# Patient Record
Sex: Female | Born: 1940 | Race: White | Hispanic: No | Smoking: Never smoker
Health system: Southern US, Community
[De-identification: ages and names within clinical notes are randomized; demographics above are authoritative.]

## PROBLEM LIST (undated history)

## (undated) DIAGNOSIS — M8580 Other specified disorders of bone density and structure, unspecified site: Secondary | ICD-10-CM

## (undated) DIAGNOSIS — I10 Essential (primary) hypertension: Secondary | ICD-10-CM

## (undated) DIAGNOSIS — I219 Acute myocardial infarction, unspecified: Secondary | ICD-10-CM

## (undated) DIAGNOSIS — E785 Hyperlipidemia, unspecified: Secondary | ICD-10-CM

## (undated) DIAGNOSIS — I259 Chronic ischemic heart disease, unspecified: Secondary | ICD-10-CM

## (undated) HISTORY — PX: BREAST SURGERY: SHX581

## (undated) HISTORY — DX: Hyperlipidemia, unspecified: E78.5

## (undated) HISTORY — DX: Essential (primary) hypertension: I10

## (undated) HISTORY — DX: Chronic ischemic heart disease, unspecified: I25.9

## (undated) HISTORY — DX: Acute myocardial infarction, unspecified: I21.9

## (undated) HISTORY — DX: Other specified disorders of bone density and structure, unspecified site: M85.80

---

## 1982-04-28 HISTORY — PX: ABDOMINAL HYSTERECTOMY: SHX81

## 1998-03-26 ENCOUNTER — Ambulatory Visit (HOSPITAL_COMMUNITY): Admission: RE | Admit: 1998-03-26 | Discharge: 1998-03-26 | Payer: Self-pay | Admitting: Gastroenterology

## 1998-03-26 ENCOUNTER — Encounter: Payer: Self-pay | Admitting: Gastroenterology

## 1999-11-29 ENCOUNTER — Other Ambulatory Visit: Admission: RE | Admit: 1999-11-29 | Discharge: 1999-11-29 | Payer: Self-pay | Admitting: *Deleted

## 2000-01-22 ENCOUNTER — Encounter: Admission: RE | Admit: 2000-01-22 | Discharge: 2000-01-22 | Payer: Self-pay | Admitting: *Deleted

## 2000-01-22 ENCOUNTER — Encounter: Payer: Self-pay | Admitting: *Deleted

## 2000-12-10 ENCOUNTER — Ambulatory Visit (HOSPITAL_COMMUNITY): Admission: RE | Admit: 2000-12-10 | Discharge: 2000-12-10 | Payer: Self-pay | Admitting: Internal Medicine

## 2001-04-28 DIAGNOSIS — I219 Acute myocardial infarction, unspecified: Secondary | ICD-10-CM

## 2001-04-28 HISTORY — DX: Acute myocardial infarction, unspecified: I21.9

## 2001-09-29 ENCOUNTER — Other Ambulatory Visit: Admission: RE | Admit: 2001-09-29 | Discharge: 2001-09-29 | Payer: Self-pay | Admitting: *Deleted

## 2001-12-29 ENCOUNTER — Encounter: Payer: Self-pay | Admitting: Internal Medicine

## 2001-12-29 ENCOUNTER — Encounter: Admission: RE | Admit: 2001-12-29 | Discharge: 2001-12-29 | Payer: Self-pay | Admitting: Internal Medicine

## 2002-01-03 ENCOUNTER — Encounter: Admission: RE | Admit: 2002-01-03 | Discharge: 2002-01-03 | Payer: Self-pay | Admitting: Internal Medicine

## 2002-01-03 ENCOUNTER — Encounter: Payer: Self-pay | Admitting: Internal Medicine

## 2002-01-10 ENCOUNTER — Ambulatory Visit (HOSPITAL_COMMUNITY): Admission: RE | Admit: 2002-01-10 | Discharge: 2002-01-10 | Payer: Self-pay | Admitting: Internal Medicine

## 2002-01-10 ENCOUNTER — Encounter: Payer: Self-pay | Admitting: Internal Medicine

## 2002-01-13 ENCOUNTER — Inpatient Hospital Stay (HOSPITAL_COMMUNITY): Admission: EM | Admit: 2002-01-13 | Discharge: 2002-01-17 | Payer: Self-pay | Admitting: Emergency Medicine

## 2002-01-13 ENCOUNTER — Encounter: Payer: Self-pay | Admitting: Emergency Medicine

## 2002-02-21 ENCOUNTER — Encounter (HOSPITAL_COMMUNITY): Admission: RE | Admit: 2002-02-21 | Discharge: 2002-05-22 | Payer: Self-pay | Admitting: Cardiology

## 2002-09-30 ENCOUNTER — Other Ambulatory Visit: Admission: RE | Admit: 2002-09-30 | Discharge: 2002-09-30 | Payer: Self-pay | Admitting: Obstetrics and Gynecology

## 2003-10-31 ENCOUNTER — Other Ambulatory Visit: Admission: RE | Admit: 2003-10-31 | Discharge: 2003-10-31 | Payer: Self-pay | Admitting: Obstetrics and Gynecology

## 2004-11-14 ENCOUNTER — Other Ambulatory Visit: Admission: RE | Admit: 2004-11-14 | Discharge: 2004-11-14 | Payer: Self-pay | Admitting: Obstetrics and Gynecology

## 2005-02-26 ENCOUNTER — Encounter: Admission: RE | Admit: 2005-02-26 | Discharge: 2005-02-26 | Payer: Self-pay | Admitting: Internal Medicine

## 2005-08-01 ENCOUNTER — Inpatient Hospital Stay (HOSPITAL_COMMUNITY): Admission: EM | Admit: 2005-08-01 | Discharge: 2005-08-02 | Payer: Self-pay | Admitting: Emergency Medicine

## 2005-12-01 ENCOUNTER — Other Ambulatory Visit: Admission: RE | Admit: 2005-12-01 | Discharge: 2005-12-01 | Payer: Self-pay | Admitting: Obstetrics and Gynecology

## 2006-05-07 ENCOUNTER — Encounter: Admission: RE | Admit: 2006-05-07 | Discharge: 2006-05-07 | Payer: Self-pay | Admitting: Internal Medicine

## 2006-12-22 ENCOUNTER — Encounter: Admission: RE | Admit: 2006-12-22 | Discharge: 2006-12-22 | Payer: Self-pay | Admitting: Internal Medicine

## 2006-12-25 ENCOUNTER — Other Ambulatory Visit: Admission: RE | Admit: 2006-12-25 | Discharge: 2006-12-25 | Payer: Self-pay | Admitting: Obstetrics and Gynecology

## 2007-01-12 ENCOUNTER — Encounter: Admission: RE | Admit: 2007-01-12 | Discharge: 2007-01-12 | Payer: Self-pay | Admitting: Internal Medicine

## 2007-09-13 ENCOUNTER — Encounter: Admission: RE | Admit: 2007-09-13 | Discharge: 2007-09-13 | Payer: Self-pay | Admitting: Internal Medicine

## 2007-10-13 ENCOUNTER — Encounter: Admission: RE | Admit: 2007-10-13 | Discharge: 2007-10-13 | Payer: Self-pay | Admitting: Internal Medicine

## 2008-01-25 ENCOUNTER — Ambulatory Visit: Payer: Self-pay | Admitting: Obstetrics and Gynecology

## 2009-02-09 ENCOUNTER — Ambulatory Visit: Payer: Self-pay | Admitting: Obstetrics and Gynecology

## 2009-02-09 ENCOUNTER — Encounter: Payer: Self-pay | Admitting: Obstetrics and Gynecology

## 2009-02-09 ENCOUNTER — Other Ambulatory Visit: Admission: RE | Admit: 2009-02-09 | Discharge: 2009-02-09 | Payer: Self-pay | Admitting: Obstetrics and Gynecology

## 2009-02-19 ENCOUNTER — Ambulatory Visit: Payer: Self-pay | Admitting: Obstetrics and Gynecology

## 2009-08-29 ENCOUNTER — Ambulatory Visit: Payer: Self-pay | Admitting: Gynecology

## 2010-02-01 ENCOUNTER — Ambulatory Visit: Payer: Self-pay | Admitting: Cardiology

## 2010-02-04 ENCOUNTER — Ambulatory Visit: Payer: Self-pay | Admitting: Cardiology

## 2010-02-20 ENCOUNTER — Ambulatory Visit: Payer: Self-pay | Admitting: Obstetrics and Gynecology

## 2010-02-20 ENCOUNTER — Other Ambulatory Visit: Admission: RE | Admit: 2010-02-20 | Discharge: 2010-02-20 | Payer: Self-pay | Admitting: Obstetrics and Gynecology

## 2010-06-19 ENCOUNTER — Ambulatory Visit: Payer: Self-pay | Admitting: Cardiology

## 2010-07-08 ENCOUNTER — Ambulatory Visit: Payer: Self-pay | Admitting: Cardiology

## 2010-08-07 ENCOUNTER — Other Ambulatory Visit: Payer: Self-pay | Admitting: *Deleted

## 2010-08-07 ENCOUNTER — Encounter: Payer: Self-pay | Admitting: *Deleted

## 2010-08-07 DIAGNOSIS — E7800 Pure hypercholesterolemia, unspecified: Secondary | ICD-10-CM

## 2010-08-08 ENCOUNTER — Encounter: Payer: Self-pay | Admitting: Cardiology

## 2010-08-08 ENCOUNTER — Ambulatory Visit (INDEPENDENT_AMBULATORY_CARE_PROVIDER_SITE_OTHER): Payer: Medicare Other | Admitting: Cardiology

## 2010-08-08 ENCOUNTER — Other Ambulatory Visit (INDEPENDENT_AMBULATORY_CARE_PROVIDER_SITE_OTHER): Payer: Medicare Other | Admitting: *Deleted

## 2010-08-08 DIAGNOSIS — I209 Angina pectoris, unspecified: Secondary | ICD-10-CM

## 2010-08-08 DIAGNOSIS — E7800 Pure hypercholesterolemia, unspecified: Secondary | ICD-10-CM

## 2010-08-08 DIAGNOSIS — I252 Old myocardial infarction: Secondary | ICD-10-CM

## 2010-08-08 DIAGNOSIS — M810 Age-related osteoporosis without current pathological fracture: Secondary | ICD-10-CM | POA: Insufficient documentation

## 2010-08-08 LAB — BASIC METABOLIC PANEL
BUN: 23 mg/dL (ref 6–23)
Calcium: 9.5 mg/dL (ref 8.4–10.5)
Chloride: 102 mEq/L (ref 96–112)
Creatinine, Ser: 1 mg/dL (ref 0.4–1.2)
Sodium: 138 mEq/L (ref 135–145)

## 2010-08-08 LAB — LIPID PANEL
HDL: 68.8 mg/dL (ref >39.00)
VLDL: 7 mg/dL (ref 0.0–40.0)

## 2010-08-08 LAB — HEPATIC FUNCTION PANEL
AST: 30 U/L (ref 0–37)
Alkaline Phosphatase: 30 U/L — ABNORMAL LOW (ref 39–117)
Total Bilirubin: 0.9 mg/dL (ref 0.3–1.2)
Total Protein: 6.4 g/dL (ref 6.0–8.3)

## 2010-08-08 MED ORDER — NITROGLYCERIN 0.4 MG SL SUBL: 0.4000 mg | SUBLINGUAL_TABLET | SUBLINGUAL | Status: DC | PRN

## 2010-08-08 NOTE — Assessment & Plan Note (Signed)
The patient has been feeling well.  She's not having any chest pain or shortness of breath.  No palpitations.  She walks for exercise.  She's not having any dizziness or syncope.  She has had some recent problems with the veins in her right leg and is being [facility]

## 2010-08-08 NOTE — Assessment & Plan Note (Signed)
The patient has been watching her diet although her weight is up slightly since last visit.  She is on low dose Crestor 5 mg daily and his also taking fish oil thousand milligrams one daily.  She is not having any adverse effects from the statin therapy

## 2010-08-08 NOTE — Progress Notes (Signed)
HPI: This pleasant 70 year old woman is seen for a scheduled 4 month followup office visit.  She had a non-Q wave myocardial infarction in 2003.  Cardiac catheterization at that time showed single vessel obstructive disease of the obtuse marginal branch there was a small vessel and was not suitable for catheter-based intervention.  She has remained on aggressive medical therapy including aspirin and Plavix beta blocker and statin.  Her last nuclear stress test was 04/13/07 and showed no she has a soft systolic murmur and an echocardiogram in August 2010 showed no significant valvular heart disease except for slight mitral valve prolapse.  Current Outpatient Prescriptions  Medication Sig Dispense Refill  . alendronate (FOSAMAX) 70 MG tablet Take 70 mg by mouth every 7 (seven) days. Take with a full glass of water on an empty stomach.       Marland Kitchen aspirin 81 MG tablet Take 81 mg by mouth daily.        . Calcium-Vitamin D (POSTURE-D PO) Take 600 mg by mouth 2 (two) times daily.        . Cholecalciferol (VITAMIN D) 1000 UNITS capsule Take 2,000 Units by mouth daily.        . clopidogrel (PLAVIX) 75 MG tablet Take 75 mg by mouth daily.        . Coenzyme Q10 (COQ10) 100 MG CAPS Take 100 mg by mouth daily.        Marland Kitchen HYDROcodone-acetaminophen (VICODIN) 5-500 MG per tablet Take 1 tablet by mouth every 6 (six) hours as needed.        . Methylcellulose, Laxative, (CITRUCEL PO) Take by mouth daily.        . metoprolol (LOPRESSOR) 50 MG tablet Take 50 mg by mouth 2 (two) times daily. 1/2 bid       . nitroGLYCERIN (NITROSTAT) 0.4 MG SL tablet Place 1 tablet (0.4 mg total) under the tongue every 5 (five) minutes as needed for chest pain.  90 tablet  3  . Omega-3 Fatty Acids (FISH OIL) 1000 MG CAPS Take by mouth daily.        . rosuvastatin (CRESTOR) 5 MG tablet Take 5 mg by mouth daily.        Marland Kitchen DISCONTD: nitroGLYCERIN (NITROSTAT) 0.4 MG SL tablet Place 0.4 mg under the tongue every 5 (five) minutes as needed.           Allergies  Allergen Reactions  . Codeine   . Lipitor (Atorvastatin Calcium)   . Simvastatin     Patient Active Problem List  Diagnoses  . Old MI (myocardial infarction)  . Hypercholesterolemia  . Osteoporosis    History  Smoking status  . Never Smoker   Smokeless tobacco  . Never Used    History  Alcohol Use No    No family history on file.  Review of Systems: The patient denies any heat or cold intolerance.  No weight gain or weight loss.  The patient denies headaches or blurry vision.  There is no cough or sputum production.  The patient denies dizziness.  There is no hematuria or hematochezia.  The patient denies any muscle aches or arthritis.  The patient denies any rash.  The patient denies frequent falling or instability.  There is no history of depression or anxiety.  All other systems were reviewed and are negative.   Physical Exam: Filed Vitals:   08/08/10 0940  BP: 120/80  Pulse: 82  The general appearance reveals a well-developed well-nourished woman in no distress.Pupils equal and  reactive.   Extraocular Movements are full.  There is no scleral icterus.  The mouth and pharynx are normal.  The neck is supple.  The carotids reveal no bruits.  The jugular venous pressure is normal.  The thyroid is not enlarged.  There is no lymphadenopathy.The chest is clear to percussion and auscultation. There are no rales or rhonchi. Expansion of the chest is symmetrical.  The heart reveals a soft systolic murmur at the base.  No gallop or rub.  No diastolic murmur.The abdomen is soft and nontender. Bowel sounds are normal. The liver and spleen are not enlarged. There Are no abdominal masses. There are no bruits.The pedal pulses are good.  There is no phlebitis or edema.  There is no cyanosis or clubbing.Strength is normal and symmetrical in all extremities.  There is no lateralizing weakness.  There are no sensory deficits.    Assessment / Plan: Continue present medication.   Blood work today pending.  Recheck in 4 months for followup office visit and fasting lab work.

## 2010-08-09 ENCOUNTER — Telehealth: Payer: Self-pay | Admitting: *Deleted

## 2010-08-09 NOTE — Telephone Encounter (Signed)
Adv.labs excellent

## 2010-09-13 NOTE — H&P (Signed)
[name], FEDORCHAK NO.:  [id number]   MEDICAL RECORD NO.:  [id number]          PATIENT TYPE:  EMS   LOCATION:  MAJO                         FACILITY:  [facility]   PHYSICIAN:  Vesta Mixer, M.D. DATE OF BIRTH:  01-14-1941   DATE OF ADMISSION:  08/01/2005  DATE OF DISCHARGE:                                HISTORY & PHYSICAL   Kayla [name] is a middle-aged female with a history of coronary artery disease.  She presents to the emergency department with several days of indigestion  and chest pain.   The patient has a long history of coronary artery disease.  She had a heart  catheterization in September 2003.  Her LAD and right coronary artery were  fairly smooth and normal.  Her left anterior descending artery gave off a  very tortuous first obtuse marginal artery.  She had a 90% stenosis in the  distal aspect of this first OM.  It was thought that this vessel was too  tortuous to attempt stenting, and she has done quite well from medical  therapy for the past 4 years.  In fact, she had a stress Cardiolite study in  September 2006 which was unremarkable.   For the past several days, she has had worsening episodes of indigestion and  some abdominal problems.  She now reports having some chest pain.  The chest  pain lasts for as long as 30 minutes.  It was described as a sharp-like  sensation.  There is no radiation.  She has noticed some numbness  occasionally of her right hand, but this does not seem to be associated with  her episodes of chest pain.  She denies any syncope or presyncope.  She  denies any shortness of breath.  She denies any PND or orthopnea.  Her diet  has been fairly normal for the past couple of days.  She has not taken any  nitroglycerin.   CURRENT MEDICATIONS:  1.  Plavix 75 mg daily.  2.  Metoprolol 25 mg p.o. twice daily.  3.  Aspirin 81 mg daily.  4.  Vytorin 10 mg/40 mg once daily.  5.  Fosamax 70 mg weekly.   ALLERGIES:  She is  intolerant to CODEINE.   PAST MEDICAL HISTORY:  1.  Coronary artery disease.  She has single-vessel coronary artery disease.  2.  Hyperlipidemia.   SOCIAL HISTORY:  The patient is a nonsmoker.   FAMILY HISTORY:  Noncontributory.   REVIEW OF SYSTEMS:  She denies any problems with her eyes, ears, nose, and  throat.  She denies any weight gain or weight loss.  She denies any GU  problems.  She has had some indigestion and abdominal pain over the past  several days.  She denies any easy bruisability.  She denies any problems  with her arms or legs.  She denies any heat or cold intolerance.  She also  denies any syncope or presyncope.  Her review of systems was reviewed and  was otherwise negative.   PHYSICAL EXAMINATION:  GENERAL:  She is an elderly female in no acute  distress.  VITAL SIGNS:  Blood pressure is 100/67 with a heart rate of 62.  Respirations are normal.  HEENT:  2+ carotids.  She has no JVD, no thyromegaly.  LUNGS:  Clear to auscultation.  HEART:  Regular rate, S1, S2.  Has no murmurs.  ABDOMEN:  Good bowel sounds and is nontender.  EXTREMITIES:  She has no cyanosis, clubbing, or edema.  There is no calf  tenderness.  NEUROLOGIC:  Nonfocal.  Cranial nerves 2-12 are intact, and motor and  sensory function are intact.   LABORATORY DATA:  Potassium 3.6.  Otherwise, her electrolytes are normal.  Her hemoglobin is 15.  Her INR is 1.0.  Initial cardiac enzymes are  negative.   Her EKG reveals normal sinus rhythm.  She has a normal EKG.   Ms. [name] presents with known coronary artery disease.  She has a very tight  stenosis in the distal aspect of her first obtuse marginal of her artery.  This stenosis is quite small and is not a candidate for any intervention.  I  would like to observe overnight just to make sure that she is stable.  There  are certain aspects of her history that are somewhat atypical.  She is on a  good medical regimen.  We will observe her tonight.   If her enzymes are  negative, then she will be able to go home tomorrow.           ______________________________  Vesta Mixer, M.D.     PJN/MEDQ  D:  08/01/2005  T:  08/01/2005  Job:  [id number]   cc:   Cassell Clement, M.D.  Fax: [phone]   Peter M. Swaziland, M.D.  Fax: [phone]   Wilson Singer, M.D.  Fax: [phone]

## 2010-09-13 NOTE — Discharge Summary (Signed)
NAME:  Kayla [name], Kayla [name]                          ACCOUNT NO.:  [id number]   MEDICAL RECORD NO.:  [id number]                   PATIENT TYPE:  INP   LOCATION:  4704                                 FACILITY:  [facility]   PHYSICIAN:  Cassell Clement, MD                DATE OF BIRTH:  07-05-1940   DATE OF ADMISSION:  01/13/2002  DATE OF DISCHARGE:  01/17/2002                                 DISCHARGE SUMMARY   FINAL DIAGNOSES:  1. Acute subendocardial myocardial infarction.  2. Chest pain.  3. Nausea and vomiting.  4. Headache.  5. Hematoma, right groin.  6. Postmenopausal state.  7. Wide complex tachycardia, resolved.   OPERATION:  Left heart cardiac catheterization on 01/13/02, by Dr. Peter  Swaziland.   HISTORY OF PRESENT ILLNESS:  This is a 70 year old married Caucasian woman  admitted with severe chest pain.  She had a two day history of vague  substernal chest discomfort which got worse on the day of admission.  She  developed severe substernal chest pain associated with weakness and numbness  of her arms, was slightly nauseated and slightly diaphoretic.  She summoned  EMS from her brother's home and at the scene her initial electrocardiogram  was normal, and then she had a run of accelerated wide complex tachycardia.  When she came to the emergency room, her EKG was again normal, but she was  having ongoing substernal discomfort.  There is no prior history of known  coronary disease or hypertension.   FAMILY HISTORY:  Positive for coronary disease.   SOCIAL HISTORY:  She does not use alcohol or tobacco.   MEDICATIONS:  Premarin 0.625 mg q.d.   PAST SURGICAL HISTORY:  Post hysterectomy.   PAST MEDICAL HISTORY:  History of borderline hypercholesterolemia in the  past, but no history of diabetes.   PHYSICAL EXAMINATION:  VITAL SIGNS:  Blood pressure 167/84, pulse 86, she is  in normal sinus rhythm, respirations are normal.  HEENT:  Unremarkable.  NECK:  Unremarkable.  CHEST:   Clear.  HEART:  Quiet precordium.  There is no murmur, rub, gallop, or click.  ABDOMEN:  Soft, without hepatosplenomegaly or masses.  EXTREMITIES:  Good peripheral pulses, no edema or phlebitis.   LABORATORY DATA:  The electrocardiogram in the [facility] showed  normal sinus rhythm, to be within normal limits.   HOSPITAL COURSE:  It was felt that the patient was having acute coronary  syndrome and that she should undergo urgent cardiac catheterization.  Dr.  Peter Swaziland was consulted and took her to the cath lab for an acute cath.  In the emergency room prior to going to the cath lab she had received IV  nitroglycerin, aspirin, and IV heparin.  The cardiac catheterization  revealed that she had a normal left main coronary and a normal left anterior  descending.  The circumflex showed a large obtuse marginal #1 branch  with  diffuse greater than 25 mm in length disease, and the mid obtuse marginal  with a 90% stenosis in an extremely tortuous vessel.  The right coronary  artery was okay, but the left ventricle did show lateral hypokinesis,  although overall vigorous left ventricular function with an ejection  fraction of 75%.  Impression was single vessel obstructive coronary disease  in the obtuse marginal #1 vessel, but this was poorly suited for catheter  based intervention because of extreme tortuosity and the length of the  lesion.  It was felt that the patient should be aggressively treated with  medication, including aspirin, Plavix, subcutaneous Lovenox, IV  nitroglycerin, Lopressor, and statins.  The patient post catheterization had  problems with significant nausea not responding to Phenergan, and did  respond to Zofran.  She developed severe headaches secondary to IV  nitroglycerin which had to be stopped because of the headache.  Her initial  troponin in the emergency room was slightly elevated at 0.1.  Subsequent  cardiac enzymes confirmed myocardial damage and her  troponin showed a marked  increase on the second set of enzymes at 17.87, and CK-MB rose to 110 on her  second set, consistent with an acute subendocardial myocardial infarction in  the distribution of the obtuse marginal #1.  The patient's vital signs  remained stable.  She continued to be incapacitated by severe headaches.  She reports that she at home takes a lot of Goody Powders for frequent  headaches.  Examination on 01/15/02, showed normal vital signs with a blood  pressure of 100/60, and she was off IV nitroglycerin.  Examination did  reveal an S4 gallop consistent with a recent myocardial infarction.  The  groin revealed a moderate tender hematoma.  There was no bruit, and pedal  pulses were present.  Because of this hematoma, her activity had to be cut  back, and Lovenox was stopped.  She had also been on Plavix and aspirin  which had to be held.  By 01/16/02, headaches were improved on a trial of  Vicodin.  There was still significant groin hematoma and the patient had  complained of increased pain the previous afternoon requiring a pressure  dressing to be applied overnight.  By 01/16/02, the hematoma was large but  stable, and Plavix and aspirin were held another day.  Inpatient cardiac  rehabilitation could not be started because of the hematoma, however, she  was put in contact with the cardiac rehabilitation team for consideration of  outpatient cardiac rehabilitation.  By 01/17/02, the patient's hematoma was  stable, headaches were stable, and she was able to be discharged home.   LABORATORY DATA:  Hematocrit on admission was 41, on discharge was 37.  Sedimentation rate was 8.  Initial blood sugar was 112, and repeat was 97.  Potassium ranged from 3.6 to 3.7.  Homocystine level was high-normal at  11.27.  The CK's were 55, 792, 394, and 253, with CK-MB's of 2.6, 110.7, 58.3, and 34.7.  Troponin-I's were 0.10, 17.87, 7.07, and 4.35.  Lipid panel  showed a cholesterol of 154,  triglycerides of 100, HDL of 58, and LDL of 76.  TSH was normal at 0.537.  High sensitivity CRP was elevated at 4.7,  consistent with high risk.  Serial EKG's did not show any ischemic changes  despite the confirmation of myocardial damage at cath with wall motion  abnormality and by enzymes.   The patient was discharged improved on the following:   DISCHARGE MEDICATIONS:  1. Ecotrin 81 mg q.d.  2. Plavix 75 mg q.d.  3. Zocor 20 mg q.p.m.  4. Lopressor 25 mg b.i.d.  5. Humibid LA 600 mg b.i.d. p.r.n. for congestion.  6. Premarin 0.3 mg q.d.  7. Citrucel packet q.d.  8. Vicodin one q.8h. p.r.n. severe pain or headache.  9. Nitrostat 150 mg sublingually p.r.n.   ACTIVITY:  She is not to drive for a week.   WOUND CARE:  She is to protect the groin, and avoid any squatting or  stooping maneuvers that would exacerbate the tension on the groin.   DIET:  Low cholesterol diet.  She will be in phase II cardiac  rehabilitation.   FOLLOWUP:  She is to see Dr. Patty Sermons in one week for office visit and EKG.   CONDITION ON DISCHARGE:  Improved.                                                   Cassell Clement, MD    TB/MEDQ  D:  02/02/2002  T:  02/06/2002  Job:  [id number]   cc:   Wilson Singer, M.D.

## 2010-09-13 NOTE — H&P (Signed)
NAME:  Kayla [name], Kayla [name]                          ACCOUNT NO.:  [id number]   MEDICAL RECORD NO.:  [id number]                   PATIENT TYPE:  INP   LOCATION:  2908                                 FACILITY:  [facility]   PHYSICIAN:  Maisie Fus A. Patty Sermons, M.D.           DATE OF BIRTH:  01/02/1941   DATE OF ADMISSION:  01/13/2002  DATE OF DISCHARGE:                                HISTORY & PHYSICAL   CHIEF COMPLAINT:  Chest pain.   HISTORY OF PRESENT ILLNESS:  This is a 70 year old married Caucasian woman  admitted with severe chest pain.  She has a two-day history of vague  substernal chest discomfort which became much worse today.  Yesterday she  noted the discomfort all day long and tried various antacids with no relief.  Today, she was in the process of driving from her home in [place] to  [place] when she developed a severe substernal chest pain associated with  weakness and numbness of her arms.  She was slightly nauseated and slightly  diaphoretic.  She turned around and went back to her brother's house and  told him about the chest pain and he called EMS.  At the scene her initial  EKG was normal and then she had a run of accelerated right complex rhythm  and a right bundle branch block configuration.  She came to the emergency  room where her electrocardiogram was again normal, but she was having  ongoing substernal discomfort.   The patient does not have any prior history of known heart disease or  hypertension.  There is no history of exertional chest pain prior to this  present episode.   FAMILY HISTORY:  Positive for heart disease.  Her mother died and had a  history of an irregular heartbeat.  Her father is still living at age 80,  but has had two myocardial infarctions.   SOCIAL HISTORY:  She does not use alcohol or tobacco.  She is a retired  [employer] operational officer.  She is married and has two children, age 70 and 32.   ALLERGIES:  CODEINE.   OPERATIONS:  Hysterectomy.   MEDICATIONS:  Premarin 0.625 mg daily.   REVIEW OF SYSTEMS:  Reveals no history of diabetes.  She has a past history  of borderline hypercholesterolemia treated with diet.  She does not get any  regular aerobic exercise.  The remainder of her review of systems is  negative in detail.   PHYSICAL EXAMINATION:  VITAL SIGNS:  Blood pressure 167/84, pulse is 86 and  is in normal sinus rhythm.  Respirations are normal.  SKIN:  Warm and dry.  Color is good.  HEENT:  Pupils are equal and reactive.  Extraocular movements are full.  There is no scleral icterus.  Mouth and pharynx normal.  NECK:  Jugular venous pressure normal.  Thyroid normal.  No lymphadenopathy.  CHEST:  Clear to percussion and auscultation.  HEART:  Reveals  a quiet precordium.  There is no murmur, gallop, rub, or  click.  ABDOMEN:  Soft.  No hepatosplenomegaly or masses.  EXTREMITIES:  Show good peripheral pulses.  No edema, no phlebitis.   LABORATORY DATA:  Electrocardiogram in [place] shows normal sinus rhythm and  is within normal limits.  Chest x-ray is pending.   IMPRESSION:  1. Possible acute coronary syndrome.  2. Wide complex tachycardia with right bundle branch block configuration,     resolved.   DISPOSITION:  We are going to have her taken to the cath lab for an acute  cath by Dr. Peter Swaziland.  She will receive IV heparin and IV nitroglycerin  in the emergency room and she has already been given aspirin in the  emergency room.   Further evaluation will follow depending on cath results.                                                 Thomas A. Patty Sermons, M.D.    TAB/MEDQ  D:  01/13/2002  T:  01/13/2002  Job:  [id number]   cc:   Wilson Singer, M.D.   Peter M. [place]iland, M.D.  [address], [address]  [place], [place] [address]  Fax: [phone]

## 2010-09-13 NOTE — Discharge Summary (Signed)
Kayla [name], SHANKLES NO.:  [id number]   MEDICAL RECORD NO.:  [id number]          PATIENT TYPE:  INP   LOCATION:  3714                         FACILITY:  [facility]   PHYSICIAN:  Vesta Mixer, M.D. DATE OF BIRTH:  1941/01/20   DATE OF ADMISSION:  08/01/2005  DATE OF DISCHARGE:  08/02/2005                                 DISCHARGE SUMMARY   DISCHARGE DIAGNOSES:  1.  Noncardiac chest pain.  2.  History of coronary artery disease.  3.  Hyperlipidemia.  4.  Osteoporosis.   DISCHARGE MEDICATIONS:  1.  Plavix 75 mg a day.  2.  Aspirin 81 mg a day.  3.  Lopressor 25 mg p.o. b.i.d.  4.  Vytorin 10/40 mg at night,  5.  Fosamax 70 mg a week.  6.  Nitroglycerin as needed.   DISPOSITION:  The patient will see Dr. Patty Sermons next week.  She may need a  followup stress test.   HISTORY:  [name] is a 70 year old female with a history of known  coronary artery disease.  She was admitted with episodes of chest pain.  Please see dictated H&P for further details.   HOSPITAL COURSE:  Chest pain.  The patient ruled out for myocardial  infarction.  Her EKG remained normal throughout the hospitalization.  She  did not have any further chest pains.  She has a normal left anterior  descending artery and a normal right coronary artery by heart  catheterization several years ago.  She was found to have an obtuse marginal  artery that was very tortuous and had a tight stenosis.  She had been  treated medically and in fact,  has had several negative stress tests since her heart catheterization.  She  will be discharged on the above-noted medications.  If she continues to have  episodes of chest pain, then she will probably need another heart  catheterization.  She has been instructed to call us back if she has  worsening problem.           ______________________________  Vesta Mixer, M.D.     PJN/MEDQ  D:  08/02/2005  T:  08/02/2005  Job:  [id number]   cc:   Peter M.  [place]azil[place], M.D.  Fax: [phone]   Cassell Clement, M.D.  Fax: [phone]   Wilson Singer, M.D.  Fax: [phone]

## 2010-09-13 NOTE — Cardiovascular Report (Signed)
NAME:  Kayla [name], Kayla [name]                          ACCOUNT NO.:  [id number]   MEDICAL RECORD NO.:  [id number]                   PATIENT TYPE:  OUT   LOCATION:  RAD                                  FACILITY:  [facility]   PHYSICIAN:  Peter M. Swaziland, M.D.               DATE OF BIRTH:  10-03-1940   DATE OF PROCEDURE:  01/13/2002  DATE OF DISCHARGE:  01/10/2002                              CARDIAC CATHETERIZATION   INDICATIONS FOR PROCEDURE:  A 70 year old female who presents with symptoms  of unstable angina. She has a history of hypercholesterolemia.   ACCESS:  Via the right femoral artery using the standard Seldinger  technique.   EQUIPMENT:  The 6 French 4 cm right and left Judkins catheter, 6 French  pigtail catheter, 6 French arterial sheath.   MEDICATIONS:  Nitroglycerin drip at 5 drops per minute per hour.  Nitroglycerin 200 mcg intracoronary x1.  Lopressor  5 mg IV x3.   CONTRAST:  Omnipaque 125 cc.   HEMODYNAMIC DATA:  Aortic pressure is 124/69 with a mean of 93 mmHg.  Left  ventricular  pressure is 140 with an EDP of 18 mmHg.   ANGIOGRAPHIC DATA:  Left coronary artery:  The left coronary artery arises  and distributes normally.   Left main:  The left main coronary artery is normal.   Left anterior descending:  The left anterior descending artery is tortuous  but appears normal.   First diagonal branch:  The first diagonal branch is a large vessel which  has a 95% lesion proximally.   Left circumflex:  The left circumflex coronary artery gives rise to a single  large marginal branch.  This branch is very tortuous. There is diffuse  disease throughout the mid marginal branch with stenosis distally up to 90-  95%.  All together, the disease segment is over 25 mmHg.   Right coronary artery:  The right coronary artery arises and distributes  normally and is a normal-appearing vessel. No collaterals are seen to the  marginal branch.   LEFT VENTRICULAR ANGIOGRAPHY:  Left  ventricular angiography was performed in  both the RAO and LAO cranial views.  This demonstrates normal left  ventricular size with hyperdynamic contractility.  There is focal  hypokinesia of the lateral wall. Overall, ejection fraction is estimated at  75%.   FINAL INTERPRETATION:  1. Single-vessel obstruction atherosclerotic coronary artery disease.  2. Hyperdynamic left ventricular function.    PLAN:  Based on the long segment of disease and extreme tortuosity of obtuse  marginal branch, as well as the small size of the vessel, this does not  appear to be suitable to catheter-based intervention. Would recommend  aggressive medical therapy at this time.  Peter M. Swaziland, M.D.    PMJ/MEDQ  D:  01/13/2002  T:  01/14/2002  Job:  [id number]   cc:   Maisie Fus A. Patty Sermons, M.D.   Wilson Singer, M.D.

## 2010-09-25 ENCOUNTER — Telehealth: Payer: Self-pay | Admitting: Cardiology

## 2010-09-25 NOTE — Telephone Encounter (Signed)
Called because she received a generic form of Plavix instead of the regular one from her pharmacist and she wanted to know if it was ok to take. Please call back.

## 2010-09-25 NOTE — Telephone Encounter (Signed)
Adv. Patient generic plavix is fine to take

## 2010-11-19 ENCOUNTER — Encounter: Payer: Self-pay | Admitting: Obstetrics and Gynecology

## 2010-12-13 ENCOUNTER — Other Ambulatory Visit: Payer: Self-pay | Admitting: *Deleted

## 2010-12-13 DIAGNOSIS — E7800 Pure hypercholesterolemia, unspecified: Secondary | ICD-10-CM

## 2011-01-13 ENCOUNTER — Other Ambulatory Visit: Payer: Self-pay | Admitting: *Deleted

## 2011-01-13 MED ORDER — METOPROLOL TARTRATE 50 MG PO TABS: ORAL_TABLET | ORAL | Status: DC

## 2011-01-13 NOTE — Telephone Encounter (Signed)
Refilled metoprolol 

## 2011-01-24 ENCOUNTER — Other Ambulatory Visit (INDEPENDENT_AMBULATORY_CARE_PROVIDER_SITE_OTHER): Payer: Medicare Other | Admitting: *Deleted

## 2011-01-24 DIAGNOSIS — E7800 Pure hypercholesterolemia, unspecified: Secondary | ICD-10-CM

## 2011-01-24 LAB — BASIC METABOLIC PANEL
CO2: 27 mEq/L (ref 19–32)
Chloride: 106 mEq/L (ref 96–112)
GFR: 56.63 mL/min — ABNORMAL LOW (ref >60.00)
Glucose, Bld: 105 mg/dL — ABNORMAL HIGH (ref 70–99)
Potassium: 5.6 mEq/L — ABNORMAL HIGH (ref 3.5–5.1)

## 2011-01-24 LAB — HEPATIC FUNCTION PANEL: Bilirubin, Direct: 0.1 mg/dL (ref 0.0–0.3)

## 2011-01-28 ENCOUNTER — Encounter: Payer: Self-pay | Admitting: Cardiology

## 2011-01-28 ENCOUNTER — Ambulatory Visit (INDEPENDENT_AMBULATORY_CARE_PROVIDER_SITE_OTHER): Payer: Medicare Other | Admitting: Cardiology

## 2011-01-28 VITALS — BP 120/65 | HR 65 | Ht 69.0 in | Wt 183.0 lb

## 2011-01-28 DIAGNOSIS — E875 Hyperkalemia: Secondary | ICD-10-CM

## 2011-01-28 DIAGNOSIS — I252 Old myocardial infarction: Secondary | ICD-10-CM

## 2011-01-28 DIAGNOSIS — I259 Chronic ischemic heart disease, unspecified: Secondary | ICD-10-CM

## 2011-01-28 DIAGNOSIS — E7800 Pure hypercholesterolemia, unspecified: Secondary | ICD-10-CM

## 2011-01-28 NOTE — Patient Instructions (Signed)
Work harder on diet and leave off salt substitute to decrease potassium

## 2011-01-28 NOTE — Assessment & Plan Note (Signed)
The patient has not been expressing any recurrent chest pain or angina.  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  Cardiac catheterization at that time showed single vessel obstructive disease of the obtuse marginal branch and there was a small vessel that was not suitable for catheter-based intervention and she did not require any PCI therefore.  She has remained on aggressive medical therapy including aspirin, Plavix, beta blocker, and statin.  Her last nuclear stress test was 04/13/07 and was unremarkable.  The patient has a soft systolic murmur and her echocardiogram in August 2010 showed no significant valvular heart disease except for slight mitral valve prolapse.  His last, visit.  She's had no chest pain or shortness of breath.  No dizziness or syncope.  She has been going to the gym about twice a week.  Her weight is up slightly since last visit.

## 2011-01-28 NOTE — Progress Notes (Signed)
Leland Her Date of Birth:  1940-09-30 [facility] [address]   [address] [place], [place]  [address] [phone]           Fax   [phone]  HPI: This pleasant 70 year old Caucasian female is seen for a scheduled four-month followup office visit.  She has a history of known ischemic heart disease.  She had a non-Q-wave myocardial infarction in 2003, which did not require percutaneous intervention.  The patient has done well on medical therapy.  She is intolerant of Lipitor and Zocor and presently is tolerating low dose Crestor.  He is also on fish oil daily.  Current Outpatient Prescriptions  Medication Sig Dispense Refill  . aspirin 81 MG tablet Take 81 mg by mouth daily.        . Calcium-Vitamin D (POSTURE-D PO) Take 600 mg by mouth 2 (two) times daily.        . Cholecalciferol (VITAMIN D) 1000 UNITS capsule Take 2,000 Units by mouth daily.        . clopidogrel (PLAVIX) 75 MG tablet Take 75 mg by mouth daily.        . Coenzyme Q10 (COQ10) 100 MG CAPS Take 100 mg by mouth daily.        Marland Kitchen HYDROcodone-acetaminophen (VICODIN) 5-500 MG per tablet Take 1 tablet by mouth every 6 (six) hours as needed.        . Methylcellulose, Laxative, (CITRUCEL PO) Take by mouth daily.        . metoprolol (LOPRESSOR) 50 MG tablet 1/2 bid  30 tablet  0  . nitroGLYCERIN (NITROSTAT) 0.4 MG SL tablet Place 1 tablet (0.4 mg total) under the tongue every 5 (five) minutes as needed for chest pain.  90 tablet  3  . Omega-3 Fatty Acids (FISH OIL) 1000 MG CAPS Take by mouth daily.        . rosuvastatin (CRESTOR) 5 MG tablet Take 5 mg by mouth daily.          Allergies  Allergen Reactions  . Codeine   . Lipitor (Atorvastatin Calcium)   . Simvastatin     Patient Active Problem List  Diagnoses  . Old MI (myocardial infarction)  . Hypercholesterolemia  . Osteoporosis    History  Smoking status  . Never Smoker   Smokeless tobacco  . Never Used    History  Alcohol Use  No    No family history on file.  Review of Systems: The patient denies any heat or cold intolerance.  No weight gain or weight loss.  The patient denies headaches or blurry vision.  There is no cough or sputum production.  The patient denies dizziness.  There is no hematuria or hematochezia.  The patient denies any muscle aches or arthritis.  The patient denies any rash.  The patient denies frequent falling or instability.  There is no history of depression or anxiety.  All other systems were reviewed and are negative.   Physical Exam: Filed Vitals:   01/28/11 0931  BP: 120/65  Pulse: 65   general appearance reveals a well-developed, well-nourished, middle-age woman in no distress.Pupils equal and reactive.   Extraocular Movements are full.  There is no scleral icterus.  The mouth and pharynx are normal.  The neck is supple.  The carotids reveal no bruits.  The jugular venous pressure is normal.  The thyroid is not enlarged.  There is no lymphadenopathy.  The chest is clear to percussion and auscultation.  There are no rales or rhonchi. Expansion of the chest is symmetrical.  Heart reveals a soft systolic ejection murmur at the base.The abdomen is soft and nontender. Bowel sounds are normal. The liver and spleen are not enlarged. There Are no abdominal masses. There are no bruits.  The pedal pulses are good.  There is no phlebitis or edema.  There is no cyanosis or clubbing. Strength is normal and symmetrical in all extremities.  There is no lateralizing weakness.  There are no sensory deficits.  The skin is warm and dry.  There is no rash.     Assessment / Plan:  Her electrocardiogram today is stable.  It shows normal sinus rhythm and no ischemic changes.  She will continue same medication, work harder at weight loss, and getting more regular exercise at the gym and diverting high potassium foods

## 2011-01-28 NOTE — Assessment & Plan Note (Signed)
Patient has history of hyperkalemia.  She is not on any medications, which would be causing her high.  She has been using salt substitute, and we will advise her to avoid that

## 2011-01-28 NOTE — Assessment & Plan Note (Signed)
The patient remains on low-dose rosuvastatin 5 mg daily.  She is tolerating it well.  Is not having myalgias.  Reviewed her blood work from 4 days ago, which is satisfactory.

## 2011-02-10 ENCOUNTER — Ambulatory Visit: Payer: Medicare Other | Admitting: Cardiology

## 2011-02-19 ENCOUNTER — Other Ambulatory Visit: Payer: Medicare Other | Admitting: *Deleted

## 2011-02-20 ENCOUNTER — Encounter: Payer: Self-pay | Admitting: Gynecology

## 2011-02-24 ENCOUNTER — Ambulatory Visit: Payer: Medicare Other | Admitting: Cardiology

## 2011-02-27 ENCOUNTER — Ambulatory Visit (INDEPENDENT_AMBULATORY_CARE_PROVIDER_SITE_OTHER): Payer: Medicare Other | Admitting: Obstetrics and Gynecology

## 2011-02-27 ENCOUNTER — Encounter: Payer: Self-pay | Admitting: Obstetrics and Gynecology

## 2011-02-27 DIAGNOSIS — N951 Menopausal and female climacteric states: Secondary | ICD-10-CM

## 2011-02-27 DIAGNOSIS — N390 Urinary tract infection, site not specified: Secondary | ICD-10-CM

## 2011-02-27 DIAGNOSIS — N952 Postmenopausal atrophic vaginitis: Secondary | ICD-10-CM

## 2011-02-27 DIAGNOSIS — M810 Age-related osteoporosis without current pathological fracture: Secondary | ICD-10-CM

## 2011-02-27 DIAGNOSIS — Z780 Asymptomatic menopausal state: Secondary | ICD-10-CM

## 2011-02-27 DIAGNOSIS — N6009 Solitary cyst of unspecified breast: Secondary | ICD-10-CM | POA: Insufficient documentation

## 2011-02-27 NOTE — Progress Notes (Signed)
Subjective:     Patient ID: Kayla [name], female   DOB: 07-18-1940, 70 y.o.   MRN: [id number]  HPIpatient came back to see me today for further followup. She has had another bone density which was improved. As per my previous advice she discussed it with [name] PCP and she is now stopped [name] Fosamax. She has had no fractures. She is having no vaginal bleeding or pelvic pain. [name] frequent UTIs have now become very infrequent. She is having mild hot flashes. She does not require HRT. She is aware of vaginal dryness. She does not feel she needs medication however. On [name] last mammogram she had a breast cyst which is stable.   Review of Systems  Constitutional: Negative.   HENT: Negative.   Eyes: Negative.   Respiratory: Negative.   Cardiovascular:       Coronary artery disease hyperlipidemia  Gastrointestinal: Negative.   Genitourinary: Negative.   Musculoskeletal: Negative.   Skin: Negative.   Neurological: Negative.   Hematological: Negative.   Psychiatric/Behavioral: Negative.        Objective:   Physical ExamHEENT: Within normal limits. Kayla [name] present Neck: No masses. Supraclavicular lymph nodes: Not enlarged. Breasts: Examined in both sitting and lying position. Symmetrical without skin changes or masses. Abdomen: Soft no masses guarding or rebound. No hernias. Pelvic: External within normal limits. BUS within normal limits. Vaginal examination shows poor estrogen effect, no cystocele enterocele or rectocele. Cervix and uterus absent. Adnexa within normal limits. Rectovaginal confirmatory. Extremities within normal limits.     Assessment:     #1. Breast cyst #2. Low bone mass now approved #3. UTIs #4. Atrophic vaginitis #5. Mild menopausal symptoms    Plan:     Observation of all the above. Continue yearly mammograms. Bone density after drug holiday of 2 years.

## 2011-03-17 ENCOUNTER — Other Ambulatory Visit: Payer: Self-pay | Admitting: Cardiology

## 2011-03-17 MED ORDER — METOPROLOL TARTRATE 50 MG PO TABS: ORAL_TABLET | ORAL | Status: DC

## 2011-04-16 ENCOUNTER — Other Ambulatory Visit: Payer: Self-pay | Admitting: *Deleted

## 2011-04-16 MED ORDER — METOPROLOL TARTRATE 50 MG PO TABS: 25.0000 mg | ORAL_TABLET | Freq: Two times a day (BID) | ORAL | Status: DC

## 2011-04-25 ENCOUNTER — Telehealth: Payer: Self-pay | Admitting: Cardiology

## 2011-04-25 NOTE — Telephone Encounter (Signed)
New Msg: Pt calling to discuss an appt pt thought she had with [facility] around date of pt lab work. Pt said at her last appt she was told someone would be calling her in December to schedule appt to see Dr. Patty Sermons around the day/time pt is coming into office in February. However pt recall for appt with [facility] states pt is not due for appt until June. Please return pt call to discuss further.

## 2011-04-25 NOTE — Telephone Encounter (Signed)
Scheduled appointment for patient, needed to come in February per last ov

## 2011-05-30 ENCOUNTER — Other Ambulatory Visit (INDEPENDENT_AMBULATORY_CARE_PROVIDER_SITE_OTHER): Payer: Medicare Other | Admitting: *Deleted

## 2011-05-30 DIAGNOSIS — E7800 Pure hypercholesterolemia, unspecified: Secondary | ICD-10-CM

## 2011-05-30 LAB — BASIC METABOLIC PANEL
BUN: 21 mg/dL (ref 6–23)
GFR: 62.17 mL/min (ref >60.00)
Potassium: 3.8 mEq/L (ref 3.5–5.1)
Sodium: 139 mEq/L (ref 135–145)

## 2011-05-30 LAB — HEPATIC FUNCTION PANEL
ALT: 15 U/L (ref 0–35)
Bilirubin, Direct: 0 mg/dL (ref 0.0–0.3)
Total Protein: 6.3 g/dL (ref 6.0–8.3)

## 2011-05-30 LAB — LIPID PANEL
Cholesterol: 161 mg/dL (ref 0–200)
HDL: 67.5 mg/dL (ref >39.00)
Triglycerides: 45 mg/dL (ref 0.0–149.0)
VLDL: 9 mg/dL (ref 0.0–40.0)

## 2011-06-03 ENCOUNTER — Ambulatory Visit: Payer: Medicare Other | Admitting: Cardiology

## 2011-06-12 ENCOUNTER — Encounter: Payer: Self-pay | Admitting: Cardiology

## 2011-06-12 ENCOUNTER — Ambulatory Visit (INDEPENDENT_AMBULATORY_CARE_PROVIDER_SITE_OTHER): Payer: Medicare Other | Admitting: Cardiology

## 2011-06-12 VITALS — BP 100/60 | HR 80 | Ht 68.0 in | Wt 165.0 lb

## 2011-06-12 DIAGNOSIS — I252 Old myocardial infarction: Secondary | ICD-10-CM

## 2011-06-12 DIAGNOSIS — I2510 Atherosclerotic heart disease of native coronary artery without angina pectoris: Secondary | ICD-10-CM

## 2011-06-12 DIAGNOSIS — E7800 Pure hypercholesterolemia, unspecified: Secondary | ICD-10-CM

## 2011-06-12 NOTE — Assessment & Plan Note (Signed)
Pressure remains on low-dose Crestor.  We reviewed her labs which are excellent.  To continue same dose.  She is not having any side effects from the Crestor

## 2011-06-12 NOTE — Patient Instructions (Signed)
Your physician recommends that you continue on your current medications as directed. Please refer to the Current Medication list given to you today.  Your physician recommends that you schedule a follow-up appointment in: 4 months with fasting labs (lp/bmet/hfp)  

## 2011-06-12 NOTE — Progress Notes (Signed)
Leland Her Date of Birth:  05/18/1940 [facility] [address] [place], [place]  [address] [phone]  Fax   [phone]  HPI: This pleasant 71 year old woman is seen for a scheduled followup office visit.  He has been doing well.  She has a past history of ischemic heart disease and had a non-Q-wave myocardial infarction in 2003.  She underwent cardiac catheterization but did not require percutaneous intervention.  She has done well on medical therapy.  She has had a prior problem with exogenous obesity.  Since last visit she has lost 18 pounds.  She has been on a special weight loss program run by her chiropractor.  Current Outpatient Prescriptions  Medication Sig Dispense Refill  . aspirin 81 MG tablet Take 81 mg by mouth daily.        . Calcium-Vitamin D (POSTURE-D PO) Take 600 mg by mouth 2 (two) times daily.        . Cholecalciferol (VITAMIN D) 1000 UNITS capsule Take 2,000 Units by mouth daily.        . clopidogrel (PLAVIX) 75 MG tablet Take 75 mg by mouth daily.        . Coenzyme Q10 (COQ10) 100 MG CAPS Take 50 mg by mouth 2 (two) times daily.       Marland Kitchen HYDROcodone-acetaminophen (VICODIN) 5-500 MG per tablet Take 1 tablet by mouth every 6 (six) hours as needed.        . Methylcellulose, Laxative, (CITRUCEL PO) Take by mouth daily.        . metoprolol (LOPRESSOR) 50 MG tablet Take 0.5 tablets (25 mg total) by mouth 2 (two) times daily. 1/2 bid  30 tablet  6  . nitroGLYCERIN (NITROSTAT) 0.4 MG SL tablet Place 1 tablet (0.4 mg total) under the tongue every 5 (five) minutes as needed for chest pain.  90 tablet  3  . Omega-3 Fatty Acids (FISH OIL) 1000 MG CAPS Take by mouth daily. Taking 1200 daily      . rosuvastatin (CRESTOR) 5 MG tablet Take 5 mg by mouth daily. 10mg  on sun. And wed.,5mg  all other days        Allergies  Allergen Reactions  . Codeine   . Lipitor (Atorvastatin Calcium)   . Simvastatin     Patient Active Problem List  Diagnoses  . Old  MI (myocardial infarction)  . Hypercholesterolemia  . Osteoporosis  . Hyperkalemia  . Senile osteoporosis  . Breast cyst    History  Smoking status  . Never Smoker   Smokeless tobacco  . Never Used    History  Alcohol Use No    Family History  Problem Relation Age of Onset  . Hypertension Mother   . Heart disease Mother   . Osteoporosis Mother   . Heart disease Father   . Colon cancer Father   . Breast cancer Maternal Grandmother   . Cancer Paternal Grandmother     Review of Systems: The patient denies any heat or cold intolerance.  No weight gain or weight loss.  The patient denies headaches or blurry vision.  There is no cough or sputum production.  The patient denies dizziness.  There is no hematuria or hematochezia.  The patient denies any muscle aches or arthritis.  The patient denies any rash.  The patient denies frequent falling or instability.  There is no history of depression or anxiety.  All other systems were reviewed and are negative.   Physical Exam: Filed Vitals:  06/12/11 1003  BP: 100/60  Pulse: 80   the general appearance reveals a well-developed well-nourished woman in no distress.The head and neck exam reveals pupils equal and reactive.  Extraocular movements are full.  There is no scleral icterus.  The mouth and pharynx are normal.  The neck is supple.  The carotids reveal no bruits.  The jugular venous pressure is normal.  The  thyroid is not enlarged.  There is no lymphadenopathy.  The chest is clear to percussion and auscultation.  There are no rales or rhonchi.  Expansion of the chest is symmetrical.  The precordium is quiet.  The first heart sound is normal.  The second heart sound is physiologically split.  There is no murmur gallop rub or click.  There is no abnormal lift or heave.  The abdomen is soft and nontender.  The bowel sounds are normal.  The liver and spleen are not enlarged.  There are no abdominal masses.  There are no abdominal bruits.   Extremities reveal good pedal pulses.  There is no phlebitis or edema.  There is no cyanosis or clubbing.  Strength is normal and symmetrical in all extremities.  There is no lateralizing weakness.  There are no sensory deficits.  The skin is warm and dry.  There is no rash.      Assessment / Plan: Continue careful diet.  Continue same medication.  Recheck in 4 months.

## 2011-06-12 NOTE — Assessment & Plan Note (Signed)
Patient has had no recurrent angina pectoris.  Exercise tolerance is good.  She has not been exercising as regularly as she previously had done

## 2011-07-31 ENCOUNTER — Other Ambulatory Visit: Payer: Self-pay | Admitting: *Deleted

## 2011-07-31 MED ORDER — CLOPIDOGREL BISULFATE 75 MG PO TABS: 75.0000 mg | ORAL_TABLET | Freq: Every day | ORAL | Status: DC

## 2011-09-26 ENCOUNTER — Other Ambulatory Visit: Payer: Medicare Other

## 2011-09-29 ENCOUNTER — Ambulatory Visit: Payer: Medicare Other | Admitting: Cardiology

## 2011-10-09 ENCOUNTER — Other Ambulatory Visit (INDEPENDENT_AMBULATORY_CARE_PROVIDER_SITE_OTHER): Payer: Medicare Other

## 2011-10-09 DIAGNOSIS — I2510 Atherosclerotic heart disease of native coronary artery without angina pectoris: Secondary | ICD-10-CM

## 2011-10-09 DIAGNOSIS — E7800 Pure hypercholesterolemia, unspecified: Secondary | ICD-10-CM

## 2011-10-09 LAB — LIPID PANEL
HDL: 74.7 mg/dL (ref >39.00)
LDL Cholesterol: 60 mg/dL (ref 0–99)
Total CHOL/HDL Ratio: 2
VLDL: 7.8 mg/dL (ref 0.0–40.0)

## 2011-10-09 LAB — HEPATIC FUNCTION PANEL
Alkaline Phosphatase: 29 U/L — ABNORMAL LOW (ref 39–117)
Bilirubin, Direct: 0.1 mg/dL (ref 0.0–0.3)
Total Protein: 5.9 g/dL — ABNORMAL LOW (ref 6.0–8.3)

## 2011-10-09 LAB — BASIC METABOLIC PANEL
CO2: 30 mEq/L (ref 19–32)
Calcium: 9.2 mg/dL (ref 8.4–10.5)
Sodium: 142 mEq/L (ref 135–145)

## 2011-10-10 NOTE — Progress Notes (Signed)
Quick Note:  Please make copy of labs for patient visit. ______ 

## 2011-10-14 ENCOUNTER — Ambulatory Visit: Payer: Medicare Other | Admitting: Cardiology

## 2011-10-28 ENCOUNTER — Ambulatory Visit (INDEPENDENT_AMBULATORY_CARE_PROVIDER_SITE_OTHER): Payer: Medicare Other | Admitting: Cardiology

## 2011-10-28 ENCOUNTER — Encounter: Payer: Self-pay | Admitting: Cardiology

## 2011-10-28 VITALS — BP 108/68 | HR 65 | Ht 68.0 in | Wt 165.0 lb

## 2011-10-28 DIAGNOSIS — I252 Old myocardial infarction: Secondary | ICD-10-CM

## 2011-10-28 DIAGNOSIS — E7800 Pure hypercholesterolemia, unspecified: Secondary | ICD-10-CM

## 2011-10-28 NOTE — Patient Instructions (Addendum)
Decrease your Vitamin D to 1,000 daily  Your physician recommends that you continue on your current medications as directed. Please refer to the Current Medication list given to you today.  Your physician wants you to follow-up in: 4 months with fasting labs (lp/bmet/hfp)  You will receive a reminder letter in the mail two months in advance. If you don't receive a letter, please call our office to schedule the follow-up appointment.

## 2011-10-28 NOTE — Progress Notes (Signed)
Kayla [name] Date of Birth:  03-08-1941 [facility] [address] [place], [place]  [address] [phone]  Fax   [phone]  HPI: This pleasant 71 year old woman is seen for a scheduled four-month followup office visit.  She has been feeling well since last visit.  She has a history of ischemic heart disease and she had a non-Q-wave myocardial infarction in 2003.  He did undergo cardiac catheterization but did not require percutaneous intervention.  He has a history of hypercholesterolemia and is on low-dose Crestor.  Current Outpatient Prescriptions  Medication Sig Dispense Refill  . aspirin 81 MG tablet Take 81 mg by mouth daily.        . Calcium-Vitamin D (POSTURE-D PO) Take 600 mg by mouth 2 (two) times daily.        . Cholecalciferol (VITAMIN D) 1000 UNITS capsule Take 1,000 Units by mouth daily.       . clopidogrel (PLAVIX) 75 MG tablet Take 1 tablet (75 mg total) by mouth daily.  30 tablet  8  . Coenzyme Q10 (COQ10) 100 MG CAPS Take 50 mg by mouth 2 (two) times daily.       Marland Kitchen HYDROcodone-acetaminophen (VICODIN) 5-500 MG per tablet Take 1 tablet by mouth every 6 (six) hours as needed.        . Methylcellulose, Laxative, (CITRUCEL PO) Take by mouth daily.        . metoprolol (LOPRESSOR) 50 MG tablet Take 0.5 tablets (25 mg total) by mouth 2 (two) times daily. 1/2 bid  30 tablet  6  . nitroGLYCERIN (NITROSTAT) 0.4 MG SL tablet Place 1 tablet (0.4 mg total) under the tongue every 5 (five) minutes as needed for chest pain.  90 tablet  3  . Omega-3 Fatty Acids (FISH OIL) 1000 MG CAPS Take by mouth daily. Taking 1200 daily      . rosuvastatin (CRESTOR) 5 MG tablet Take 5 mg by mouth daily. 10mg  on sun. And wed.,5mg  all other days        Allergies  Allergen Reactions  . Codeine   . Lipitor (Atorvastatin Calcium)   . Simvastatin     Patient Active Problem List  Diagnosis  . Old MI (myocardial infarction)  . Hypercholesterolemia  . Osteoporosis  .  Hyperkalemia  . Senile osteoporosis  . Breast cyst    History  Smoking status  . Never Smoker   Smokeless tobacco  . Never Used    History  Alcohol Use No    Family History  Problem Relation Age of Onset  . Hypertension Mother   . Heart disease Mother   . Osteoporosis Mother   . Heart disease Father   . Colon cancer Father   . Breast cancer Maternal Grandmother   . Cancer Paternal Grandmother     Review of Systems: The patient denies any heat or cold intolerance.  No weight gain or weight loss.  The patient denies headaches or blurry vision.  There is no cough or sputum production.  The patient denies dizziness.  There is no hematuria or hematochezia.  The patient denies any muscle aches or arthritis.  The patient denies any rash.  The patient denies frequent falling or instability.  There is no history of depression or anxiety.  All other systems were reviewed and are negative.   Physical Exam: Filed Vitals:   10/28/11 1144  BP: 108/68  Pulse: 65   the general appearance reveals a well-developed well-nourished woman in no distress.The head and  neck exam reveals pupils equal and reactive.  Extraocular movements are full.  There is no scleral icterus.  The mouth and pharynx are normal.  The neck is supple.  The carotids reveal no bruits.  The jugular venous pressure is normal.  The  thyroid is not enlarged.  There is no lymphadenopathy.  The chest is clear to percussion and auscultation.  There are no rales or rhonchi.  Expansion of the chest is symmetrical.  The precordium is quiet.  The first heart sound is normal.  The second heart sound is physiologically split.  There is no  gallop rub or click.  There is a soft systolic ejection murmur at the base.  There is no abnormal lift or heave.  The abdomen is soft and nontender.  The bowel sounds are normal.  The liver and spleen are not enlarged.  There are no abdominal masses.  There are no abdominal bruits.  Extremities reveal good  pedal pulses.  There is no phlebitis or edema.  There is no cyanosis or clubbing.  Strength is normal and symmetrical in all extremities.  There is no lateralizing weakness.  There are no sensory deficits.  The skin is warm and dry.  There is no rash.      Assessment / Plan: Continue same medication.  Recheck in 4 months for followup office visit and fasting lipid panel hepatic function panel and basal metabolic panel.  Are decreasing [name] vitamin D3 to just 1000 units daily.

## 2011-10-28 NOTE — Assessment & Plan Note (Signed)
Patient has not had any recurrent angina pectoris.  Normally she goes to the gym several times a week but they have moved to a new house in [place] and they have been busy with moving into the new house

## 2011-10-28 NOTE — Assessment & Plan Note (Signed)
Patient has a past history of hypercholesterolemia.  We reviewed her recent labs which are excellent.  He will continue same dose of Crestor

## 2011-11-12 ENCOUNTER — Other Ambulatory Visit: Payer: Self-pay | Admitting: Cardiology

## 2011-11-12 MED ORDER — METOPROLOL TARTRATE 50 MG PO TABS: 25.0000 mg | ORAL_TABLET | Freq: Two times a day (BID) | ORAL | Status: DC

## 2011-11-19 ENCOUNTER — Other Ambulatory Visit: Payer: Self-pay | Admitting: *Deleted

## 2011-11-19 MED ORDER — ROSUVASTATIN CALCIUM 5 MG PO TABS: 5.0000 mg | ORAL_TABLET | Freq: Every day | ORAL | Status: DC

## 2011-12-01 ENCOUNTER — Encounter: Payer: Self-pay | Admitting: Obstetrics and Gynecology

## 2012-01-20 ENCOUNTER — Ambulatory Visit (INDEPENDENT_AMBULATORY_CARE_PROVIDER_SITE_OTHER): Payer: Medicare Other | Admitting: Obstetrics and Gynecology

## 2012-01-20 DIAGNOSIS — R300 Dysuria: Secondary | ICD-10-CM

## 2012-01-20 DIAGNOSIS — N9089 Other specified noninflammatory disorders of vulva and perineum: Secondary | ICD-10-CM

## 2012-01-20 LAB — URINALYSIS W MICROSCOPIC + REFLEX CULTURE
Glucose, UA: NEGATIVE mg/dL
Nitrite: NEGATIVE
Protein, ur: NEGATIVE mg/dL
pH: 6 (ref 5.0–8.0)

## 2012-01-20 LAB — WET PREP FOR TRICH, YEAST, CLUE: Trich, Wet Prep: NONE SEEN

## 2012-01-20 NOTE — Progress Notes (Signed)
Patient came to see me today with a five-day history of burning after finishing urination. It is not during urination but after she finishes. She is having no frequency or urgency of urination. She is having no vaginal discharge or itching. She is having no pelvic pain.  Exam: External: Within normal limits. BUS: Within normal limits. Vaginal exam: Slightly atrophic changes with negative wet prep. Urinalysis: 3-6 white blood cells per high-power field. Kym Gardner present during exam.  Assessment: Probable urinary tract infection  Plan: Urine culture. Uribel 4 times a day until culture back.

## 2012-01-20 NOTE — Patient Instructions (Signed)
We will call you with culture results. 

## 2012-01-23 ENCOUNTER — Telehealth: Payer: Self-pay | Admitting: *Deleted

## 2012-01-23 MED ORDER — NITROFURANTOIN MONOHYD MACRO 100 MG PO CAPS: 100.0000 mg | ORAL_CAPSULE | Freq: Two times a day (BID) | ORAL | Status: DC

## 2012-01-23 NOTE — Addendum Note (Signed)
Addended by: Aura Camps on: 01/23/2012 11:00 AM   Modules accepted: Orders

## 2012-01-23 NOTE — Telephone Encounter (Signed)
rx sent,left below on pt voicemail.

## 2012-01-23 NOTE — Telephone Encounter (Signed)
Pt given Uribel 4 times a day on 01/20/12, pt said that uribel helps, but still has vaginal burning. Please advise

## 2012-01-23 NOTE — Telephone Encounter (Signed)
Macrobid twice a day with food for 5 days

## 2012-01-24 LAB — URINE CULTURE: Colony Count: 15000

## 2012-01-30 ENCOUNTER — Other Ambulatory Visit: Payer: Self-pay | Admitting: Obstetrics and Gynecology

## 2012-01-30 DIAGNOSIS — N390 Urinary tract infection, site not specified: Secondary | ICD-10-CM

## 2012-01-30 MED ORDER — LEVOFLOXACIN 250 MG PO TABS: 250.0000 mg | ORAL_TABLET | Freq: Every day | ORAL | Status: DC

## 2012-02-12 ENCOUNTER — Other Ambulatory Visit: Payer: Medicare Other

## 2012-02-12 DIAGNOSIS — N390 Urinary tract infection, site not specified: Secondary | ICD-10-CM

## 2012-03-02 ENCOUNTER — Ambulatory Visit: Payer: Medicare Other | Admitting: Cardiology

## 2012-03-09 ENCOUNTER — Ambulatory Visit: Payer: Medicare Other | Admitting: Cardiology

## 2012-03-18 ENCOUNTER — Telehealth: Payer: Self-pay | Admitting: Cardiology

## 2012-03-18 DIAGNOSIS — E7800 Pure hypercholesterolemia, unspecified: Secondary | ICD-10-CM

## 2012-03-18 NOTE — Telephone Encounter (Signed)
New problem:     Patient would like to have lab work prior to appt in Dec.  Need lab order put into the system.

## 2012-03-19 ENCOUNTER — Ambulatory Visit: Payer: Medicare Other | Admitting: Cardiology

## 2012-03-29 ENCOUNTER — Other Ambulatory Visit: Payer: Medicare Other

## 2012-03-29 ENCOUNTER — Ambulatory Visit: Payer: Medicare Other | Admitting: Cardiology

## 2012-04-12 ENCOUNTER — Ambulatory Visit (INDEPENDENT_AMBULATORY_CARE_PROVIDER_SITE_OTHER): Payer: Medicare Other | Admitting: Obstetrics and Gynecology

## 2012-04-12 ENCOUNTER — Encounter: Payer: Self-pay | Admitting: Obstetrics and Gynecology

## 2012-04-12 VITALS — BP 124/74 | Ht 68.0 in | Wt 179.0 lb

## 2012-04-12 DIAGNOSIS — I219 Acute myocardial infarction, unspecified: Secondary | ICD-10-CM | POA: Insufficient documentation

## 2012-04-12 DIAGNOSIS — N952 Postmenopausal atrophic vaginitis: Secondary | ICD-10-CM

## 2012-04-12 DIAGNOSIS — E785 Hyperlipidemia, unspecified: Secondary | ICD-10-CM | POA: Insufficient documentation

## 2012-04-12 DIAGNOSIS — N951 Menopausal and female climacteric states: Secondary | ICD-10-CM

## 2012-04-12 DIAGNOSIS — N390 Urinary tract infection, site not specified: Secondary | ICD-10-CM

## 2012-04-12 DIAGNOSIS — I259 Chronic ischemic heart disease, unspecified: Secondary | ICD-10-CM | POA: Insufficient documentation

## 2012-04-12 DIAGNOSIS — M8580 Other specified disorders of bone density and structure, unspecified site: Secondary | ICD-10-CM

## 2012-04-12 DIAGNOSIS — M899 Disorder of bone, unspecified: Secondary | ICD-10-CM

## 2012-04-12 DIAGNOSIS — R232 Flushing: Secondary | ICD-10-CM

## 2012-04-12 NOTE — Progress Notes (Signed)
Patient came to see me today for further followup. Earlier this fall we treated her for urinary tract infection. We initially used Macrobid but had to switch to Levaquin to adequately treat her. Since then she has had no dysuria, frequency, urgency or urinary incontinence. She is having no hematuria. She had a total abdominal hysterectomy in 1984 for dysfunctional uterine bleeding. She has never had an abnormal Pap smear. Her last Pap smear was 2011. She is up-to-date on mammography and colonoscopy. She has a history of osteopenia and took Fosamax for greater than 5 years. Her bone density in 2012 was normal and she is on drug holiday. She has had no fractures. Her husband has liver and brain cancer but is doing reasonably well. They are sexually active. She is having vaginal dryness with dyspareunia. She continues to have some hot flashes. She is not on HRT and seems to be okay without treatment. She is having no vaginal bleeding. She is having no pelvic pain.  ROS: 12 system review done. Pertinent positives above. Other positives include coronary artery disease, hyperlipidemia and history of a myocardial infarction.  HEENT: Within normal limits.Kennon Portela present.Neck: No masses. Supraclavicular lymph nodes: Not enlarged. Breasts: Examined in both sitting and lying position. Symmetrical without skin changes or masses. Abdomen: Soft no masses guarding or rebound. No hernias. Pelvic: External within normal limits. BUS within normal limits. Vaginal examination shows fair  estrogen effect, no cystocele enterocele or rectocele. Cervix and uterus absent. Adnexa within normal limits. Rectovaginal confirmatory. Extremities within normal limits.  Assessment: #1. Atrophic vaginitis-symptomatic #2. History of osteopenia #3. Recurrent urinary tract infections #4. Hot flashes  Plan:Hyalo gyn gel. Pap not done.The new Pap smear guidelines were discussed with the patient. Continue yearly mammograms. Urinalysis  checked.

## 2012-04-12 NOTE — Patient Instructions (Signed)
Continue yearly mammograms 

## 2012-04-15 ENCOUNTER — Ambulatory Visit: Payer: Medicare Other | Admitting: Cardiology

## 2012-05-03 ENCOUNTER — Ambulatory Visit (INDEPENDENT_AMBULATORY_CARE_PROVIDER_SITE_OTHER): Payer: Medicare Other | Admitting: Gynecology

## 2012-05-03 VITALS — BP 128/74

## 2012-05-03 DIAGNOSIS — N952 Postmenopausal atrophic vaginitis: Secondary | ICD-10-CM | POA: Insufficient documentation

## 2012-05-03 DIAGNOSIS — R300 Dysuria: Secondary | ICD-10-CM

## 2012-05-03 LAB — URINALYSIS W MICROSCOPIC + REFLEX CULTURE
Glucose, UA: NEGATIVE mg/dL
Hgb urine dipstick: NEGATIVE
Ketones, ur: NEGATIVE mg/dL
Leukocytes, UA: NEGATIVE
Nitrite: NEGATIVE
Protein, ur: NEGATIVE mg/dL

## 2012-05-03 MED ORDER — NONFORMULARY OR COMPOUNDED ITEM: Status: DC

## 2012-05-03 NOTE — Addendum Note (Signed)
Addended by: Bertram Savin A on: 05/03/2012 03:08 PM   Modules accepted: Orders

## 2012-05-03 NOTE — Patient Instructions (Addendum)
Interstitial Cystitis Interstitial cystitis (IC) is a condition that results in discomfort or pain in the bladder and the surrounding pelvic region. The symptoms can be different from case to case and even in the same individual. People may experience:  Mild discomfort.  Pressure.  Tenderness.  Intense pain in the bladder and pelvic area. CAUSES  Because IC varies so much in symptoms and severity, people studying this disease believe it is not one but several diseases. Some caregivers use the term painful bladder syndrome (PBS) to describe cases with painful urinary symptoms. This may not meet the strictest definition of IC. The term IC / PBS includes all cases of urinary pain that cannot be connected to other causes, such as infection or urinary stones.  SYMPTOMS  Symptoms may include:  An urgent need to urinate.  A frequent need to urinate.  A combination of these symptoms. Pain may change in intensity as the bladder fills with urine or as it empties. Women's symptoms often get worse during menstruation. They may sometimes experience pain with vaginal intercourse. Some of the symptoms of IC / PBS seem like those of bacterial infection. Tests do not show infection. IC / PBS is far more common in women than in men.  DIAGNOSIS  The diagnosis of IC / PBS is based on:  Presence of pain related to the bladder, usually along with problems of frequency and urgency.  Not finding other diseases that could cause the symptoms.  Diagnostic tests that help rule out other diseases include:  Urinalysis.  Urine culture.  Cystoscopy.  Biopsy of the bladder wall.  Distension of the bladder under anesthesia.  Urine cytology.  Laboratory examination of prostate secretions. A biopsy is a tissue sample that can be looked at under a microscope. Samples of the bladder and urethra may be removed during a cystoscopy. A biopsy helps rule out bladder cancer. TREATMENT  Scientists have not yet found  a cure for IC / PBS. Patients with IC / PBS do not get better with antibiotic therapy. Caregivers cannot predict who will respond best to which treatment. Symptoms may disappear without explanation. Disappearing symptoms may coincide with an event such as a change in diet or treatment. Even when symptoms disappear, they may return after days, weeks, months, or years.  Because the causes of IC / PBS are unknown, current treatments are aimed at relieving symptoms. Many people are helped by one or a combination of the treatments. As researchers learn more about IC / PBS, the list of potential treatments will change. Patients should discuss their options with a caregiver. SURGERY  Surgery should be considered only if all available treatments have failed and the pain is disabling. Many approaches and techniques are used. Each approach has its own advantages and complications. Advantages and complications should be discussed with a urologist. Your caregiver may recommend consulting another urologist for a second opinion. Most caregivers are reluctant to operate because the outcome is unpredictable. Some people still have symptoms after surgery.  People considering surgery should discuss the potential risks and benefits, side effects, and long- and short-term complications with their family, as well as with people who have already had the procedure. Surgery requires anesthesia, hospitalization, and in some cases weeks or months of recovery. As the complexity of the procedure increases, so do the chances for complications and for failure. HOME CARE INSTRUCTIONS   All drugs, even those sold over the counter, have side effects. Patients should always consult a caregiver before using any   drug for an extended amount of time. Only take over-the-counter or prescription medicines for pain, discomfort, or fever as directed by your caregiver.  Many patients feel that smoking makes their symptoms worse. How the by-products  of tobacco that are excreted in the urine affect IC / PBS is unknown. Smoking is the major known cause of bladder cancer. One of the best things smokers can do for their bladder and their overall health is to quit.  Many patients feel that gentle stretching exercises help relieve IC / PBS symptoms.  Methods vary, but basically patients decide to empty their bladder at designated times and use relaxation techniques and distractions to keep to the schedule. Gradually, patients try to lengthen the time between scheduled voids. A diary in which to record voiding times is usually helpful in keeping track of progress. MAKE SURE YOU:   Understand these instructions.  Will watch your condition.  Will get help right away if you are not doing well or get worse. Document Released: 12/14/2003 Document Revised: 07/07/2011 Document Reviewed: 02/28/2008 [facility] Patient Information 2013 [place], [place].  Hormone Therapy At menopause, your body begins making less estrogen and progesterone hormones. This causes the body to stop having menstrual periods. This is because estrogen and progesterone hormones control your periods and menstrual cycle. A lack of estrogen may cause symptoms such as:  Hot flushes (or hot flashes).  Vaginal dryness.  Dry skin.  Loss of sex drive.  Risk of bone loss (osteoporosis). When this happens, you may choose to take hormone therapy to get back the estrogen lost during menopause. When the hormone estrogen is given alone, it is usually referred to as ET (Estrogen Therapy). When the hormone progestin is combined with estrogen, it is generally called HT (Hormone Therapy). This was formerly known as hormone replacement therapy (HRT). Your caregiver can help you make a decision on what will be best for you. The decision to use HT seems to change often as new studies are done. Many studies do not agree on the benefits of hormone replacement therapy. LIKELY BENEFITS OF HT INCLUDE  PROTECTION FROM:  Hot Flushes (also called hot flashes) - A hot flush is a sudden feeling of heat that spreads over the face and body. The skin may redden like a blush. It is connected with sweats and sleep disturbance. Women going through menopause may have hot flushes a few times a month or several times per day depending on the woman.  Osteoporosis (bone loss)- Estrogen helps guard against bone loss. After menopause, a woman's bones slowly lose calcium and become weak and brittle. As a result, bones are more likely to break. The hip, wrist, and spine are affected most often. Hormone therapy can help slow bone loss after menopause. Weight bearing exercise and taking calcium with vitamin D also can help prevent bone loss. There are also medications that your caregiver can prescribe that can help prevent osteoporosis.  Vaginal Dryness - Loss of estrogen causes changes in the vagina. Its lining may become thin and dry. These changes can cause pain and bleeding during sexual intercourse. Dryness can also lead to infections. This can cause burning and itching. (Vaginal estrogen treatment can help relieve pain, itching, and dryness.)  Urinary Tract Infections are more common after menopause because of lack of estrogen. Some women also develop urinary incontinence because of low estrogen levels in the vagina and bladder.  Possible other benefits of estrogen include a positive effect on mood and short-term memory in women. RISKS  AND COMPLICATIONS  Using estrogen alone without progesterone causes the lining of the uterus to grow. This increases the risk of lining of the uterus (endometrial) cancer. Your caregiver should give another hormone called progestin if you have a uterus.  Women who take combined (estrogen and progestin) HT appear to have an increased risk of breast cancer. The risk appears to be small, but increases throughout the time that HT is taken.  Combined therapy also makes the breast  tissue slightly denser which makes it harder to read mammograms (breast X-rays).  Combined, estrogen and progesterone therapy can be taken together every day, in which case there may be spotting of blood. HT therapy can be taken cyclically in which case you will have menstrual periods. Cyclically means HT is taken for a set amount of days, then not taken, then this process is repeated.  HT may increase the risk of stroke, heart attack, breast cancer and forming blood clots in your leg.  Transdermal estrogen (estrogen that is absorbed through the skin with a patch or a cream) may have more positive results with:  Cholesterol.  Blood pressure.  Blood clots. Having the following conditions may indicate you should not have HT:  Endometrial cancer.  Liver disease.  Breast cancer.  Heart disease.  History of blood clots.  Stroke. TREATMENT   If you choose to take HT and have a uterus, usually estrogen and progestin are prescribed.  Your caregiver will help you decide the best way to take the medications.  Possible ways to take estrogen include:  Pills.  Patches.  Gels.  Sprays.  Vaginal estrogen cream, rings and tablets.  It is best to take the lowest dose possible that will help your symptoms and take them for the shortest period of time that you can.  Hormone therapy can help relieve some of the problems (symptoms) that affect women at menopause. Before making a decision about HT, talk to your caregiver about what is best for you. Be well informed and comfortable with your decisions. HOME CARE INSTRUCTIONS   Follow your caregivers advice when taking the medications.  A Pap test is done to screen for cervical cancer.  The first Pap test should be done at age 74.  Between ages 58 and 2, Pap tests are repeated every 2 years.  Beginning at age 7, you are advised to have a Pap test every 3 years as long as your past 3 Pap tests have been normal.  Some women have  medical problems that increase the chance of getting cervical cancer. Talk to your caregiver about these problems. It is especially important to talk to your caregiver if a new problem develops soon after your last Pap test. In these cases, your caregiver may recommend more frequent screening and Pap tests.  The above recommendations are the same for women who have or have not gotten the vaccine for HPV (Human Papillomavirus).  If you had a hysterectomy for a problem that was not a cancer or a condition that could lead to cancer, then you no longer need Pap tests. However, even if you no longer need a Pap test, a regular exam is a good idea to make sure no other problems are starting.   If you are between ages 40 and 4, and you have had normal Pap tests going back 10 years, you no longer need Pap tests. However, even if you no longer need a Pap test, a regular exam is a good idea to make sure  no other problems are starting.   If you have had past treatment for cervical cancer or a condition that could lead to cancer, you need Pap tests and screening for cancer for at least 20 years after your treatment.  If Pap tests have been discontinued, risk factors (such as a new sexual partner) need to be re-assessed to determine if screening should be resumed.  Some women may need screenings more often if they are at high risk for cervical cancer.  Get mammograms done as per the advice of your caregiver. SEEK IMMEDIATE MEDICAL CARE IF:  You develop abnormal vaginal bleeding.  You have pain or swelling in your legs, shortness of breath, or chest pain.  You develop dizziness or headaches.  You have lumps or changes in your breasts or armpits.  You have slurred speech.  You develop weakness or numbness of your arms or legs.  You have pain, burning, or bleeding when urinating.  You develop abdominal pain. Document Released: 01/11/2003 Document Revised: 07/07/2011 Document Reviewed:  05/01/2010 [facility] Patient Information 2013 [place], [place].

## 2012-05-03 NOTE — Progress Notes (Signed)
Patient presented to the office today complaining of some slight burning discomfort at the tail end of urination. She states is a dull discomfort that comes and goes it is not all that time. Patient denies any fever chills nausea vomiting or any blood in her urine or any back pain. Review of her record indicated that she was seen the office on December 16 for her annual exam. Also she had been treated for urinary tract infection early last fall when she was initially treated with Macrobid and was switched to Levaquin to adequately treat her urinary tract infection. Patient has had a total abdominal hysterectomy in 1984 for dysfunction uterine bleeding. No prior history of abnormal Pap smears. She is up-to-date on mammography and colonoscopy.  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 We discussed using vaginal and vulvar estrogen twice a week to see if this would alleviate her symptoms. I had prescribed her estradiol 0.02% 1 mL pre-filled applicator to apply twice a week. Patient denies any past history of breast cancer. We discussed the risks benefits and pros and cons. We will run a urine culture. If her urine culture is negative we'll wait to see how she responds to the above regimen. If she continues with symptoms after 3-4 months she may need to be referred to the urologist for further evaluation for possible interstitial cystitis. I've given her literature information on interstitial cystitis as well as on hormone replacement therapy. She was given sample of Uribell and has best modification to take 1 by mouth 4 times a day for 2 days as  we wait for the results of the culture.

## 2012-05-05 LAB — URINE CULTURE: Organism ID, Bacteria: NO GROWTH

## 2012-05-06 ENCOUNTER — Telehealth: Payer: Self-pay | Admitting: *Deleted

## 2012-05-06 NOTE — Telephone Encounter (Signed)
Pt informed with the below note she will try the compound estrogen and follow up if needed.

## 2012-05-06 NOTE — Telephone Encounter (Signed)
Pt was seen on 05/03/12 complaining of some slight burning discomfort at the tail end of urination. Given Rx for estradiol 0.02% apply vaginally twice a week. Pt said after reading the information you gave her about estradiol and she decided not to try medication now at this time. Because she has had heart attack and vein treatments. She asked about her urine culture which showed no growth, still c/o vaginal burning. Pt asked if you would be willing to give her a dose of Levaquin? To see if this would help with vaginal burning. Please advise

## 2012-05-06 NOTE — Telephone Encounter (Signed)
Please tell her that her urine culture was negative. The Levaquin is not going to improve her symptoms. She has vaginal atrophy. The very low dose topical estrogen that I had prescribed has very little absorption and she would benefit tremendously. If she like she could come by the office to repeat her urinalysis. Otherwise if she likes to try 2% Xylocaine gel for relief and she can apply it is many times during the day she likes and we can call in for her. One tube with 3 refills.

## 2012-05-07 ENCOUNTER — Other Ambulatory Visit: Payer: Medicare Other

## 2012-05-10 ENCOUNTER — Ambulatory Visit: Payer: Medicare Other | Admitting: Cardiology

## 2012-05-20 ENCOUNTER — Ambulatory Visit (INDEPENDENT_AMBULATORY_CARE_PROVIDER_SITE_OTHER): Payer: Medicare Other | Admitting: Gynecology

## 2012-05-20 ENCOUNTER — Encounter: Payer: Self-pay | Admitting: Gynecology

## 2012-05-20 DIAGNOSIS — N9089 Other specified noninflammatory disorders of vulva and perineum: Secondary | ICD-10-CM

## 2012-05-20 DIAGNOSIS — N898 Other specified noninflammatory disorders of vagina: Secondary | ICD-10-CM

## 2012-05-20 DIAGNOSIS — N952 Postmenopausal atrophic vaginitis: Secondary | ICD-10-CM

## 2012-05-20 LAB — URINALYSIS W MICROSCOPIC + REFLEX CULTURE
Ketones, ur: 15 mg/dL — AB
Leukocytes, UA: NEGATIVE
Nitrite: NEGATIVE
Specific Gravity, Urine: 1.01 (ref 1.005–1.030)
pH: 5 (ref 5.0–8.0)

## 2012-05-20 LAB — WET PREP FOR TRICH, YEAST, CLUE: WBC, Wet Prep HPF POC: NONE SEEN

## 2012-05-20 MED ORDER — CIPROFLOXACIN HCL 250 MG PO TABS: 250.0000 mg | ORAL_TABLET | Freq: Two times a day (BID) | ORAL | Status: DC

## 2012-05-20 MED ORDER — NYSTATIN-TRIAMCINOLONE 100000-0.1 UNIT/GM-% EX OINT: TOPICAL_OINTMENT | Freq: Two times a day (BID) | CUTANEOUS | Status: DC

## 2012-05-20 NOTE — Progress Notes (Signed)
Patient presents with an unusual history to include being seen in September complaining of some dysuria. Started on Macrobid and then her urine culture had grown out ACINETOBACTER And was switched to levofloxacin. Patient notes that her symptoms of dysuria seem to improve she represent beginning of January complaining of similar symptoms. Her urinalysis and culture was negative she was begun on estradiol cream for atrophic vaginitis although admits to not starting immediately and has only been on it for 2 weeks. She notes now dysuria following voiding more as a vulvar discomfort that comes and goes seems to resolve and then reappear after voiding. No back pain frequency urgency vaginal discharge or overt itching.  Exam with [name] assistant Abdomen soft nontender without masses guarding rebound organomegaly. Pelvic external BUS vagina with atrophic changes the visible vulvar lesions or abnormalities. Vagina with slight white discharge. Bimanual without masses or tenderness  Assessment and plan: wet prep and urinalysis unremarkable.  Am going to cover her arbitrarily with ciprofloxacin 250 mg twice a day x7 days given her past history and recommend Mytrex cream externally twice a day To cover for a yeast involvement although again her vaginal smear was negative. Patient will follow up if her symptoms persist. I did relate that she did not get the estrogen cream long enough to take effect but historically is a little unusual for atrophic vaginitis present acutely as she was totally asymptomatic prior to her initial September presentation.

## 2012-05-20 NOTE — Patient Instructions (Signed)
Take antibiotic pill twice daily for one week Apply cream to the outside of the vagina to the ear to the area twice daily Follow up if symptoms persist or recur.

## 2012-05-21 ENCOUNTER — Other Ambulatory Visit (INDEPENDENT_AMBULATORY_CARE_PROVIDER_SITE_OTHER): Payer: Medicare Other

## 2012-05-21 DIAGNOSIS — E7800 Pure hypercholesterolemia, unspecified: Secondary | ICD-10-CM

## 2012-05-21 LAB — LIPID PANEL
Cholesterol: 148 mg/dL (ref 0–200)
HDL: 61.2 mg/dL (ref >39.00)
Total CHOL/HDL Ratio: 2
Triglycerides: 69 mg/dL (ref 0.0–149.0)

## 2012-05-21 LAB — HEPATIC FUNCTION PANEL
ALT: 22 U/L (ref 0–35)
AST: 38 U/L — ABNORMAL HIGH (ref 0–37)
Albumin: 3.9 g/dL (ref 3.5–5.2)
Total Bilirubin: 1.2 mg/dL (ref 0.3–1.2)
Total Protein: 6.6 g/dL (ref 6.0–8.3)

## 2012-05-21 LAB — BASIC METABOLIC PANEL
Calcium: 9.3 mg/dL (ref 8.4–10.5)
GFR: 57.73 mL/min — ABNORMAL LOW (ref >60.00)
Glucose, Bld: 90 mg/dL (ref 70–99)
Potassium: 3.8 mEq/L (ref 3.5–5.1)
Sodium: 137 mEq/L (ref 135–145)

## 2012-05-21 NOTE — Progress Notes (Signed)
Quick Note:  Please make copy of labs for patient visit. ______ 

## 2012-05-31 ENCOUNTER — Ambulatory Visit (INDEPENDENT_AMBULATORY_CARE_PROVIDER_SITE_OTHER): Payer: Medicare Other | Admitting: Cardiology

## 2012-05-31 ENCOUNTER — Encounter: Payer: Self-pay | Admitting: Cardiology

## 2012-05-31 VITALS — BP 130/64 | HR 69 | Resp 18 | Ht 69.0 in | Wt 171.4 lb

## 2012-05-31 DIAGNOSIS — I259 Chronic ischemic heart disease, unspecified: Secondary | ICD-10-CM

## 2012-05-31 DIAGNOSIS — I10 Essential (primary) hypertension: Secondary | ICD-10-CM

## 2012-05-31 DIAGNOSIS — E7800 Pure hypercholesterolemia, unspecified: Secondary | ICD-10-CM

## 2012-05-31 NOTE — Progress Notes (Signed)
Kayla [name] Her Date of Birth:  1941-01-27 [facility] [address] [place], [place]  [address] [phone]         Fax   [phone]  History of Present Illness: This pleasant 72 year old woman is seen for a scheduled four-month followup office visit. She has been feeling well since last visit. She has a history of ischemic heart disease and she had a non-Q-wave myocardial infarction in 2003.  She did undergo cardiac catheterization but did not require percutaneous intervention. He has a history of hypercholesterolemia and is on low-dose Crestor.  His last visit she has been under more stress because her husband has liver cancer with metastases to the brain.   Current Outpatient Prescriptions  Medication Sig Dispense Refill  . aspirin 81 MG tablet Take 81 mg by mouth daily.        . Calcium-Vitamin D (POSTURE-D PO) Take 600 mg by mouth 2 (two) times daily.        . Cholecalciferol (VITAMIN D) 1000 UNITS capsule Take 1,000 Units by mouth daily.       . clopidogrel (PLAVIX) 75 MG tablet Take 1 tablet (75 mg total) by mouth daily.  30 tablet  8  . Coenzyme Q10 (COQ10) 100 MG CAPS Take 50 mg by mouth 2 (two) times daily.       Marland Kitchen HYDROcodone-acetaminophen (VICODIN) 5-500 MG per tablet Take 1 tablet by mouth every 6 (six) hours as needed.        . Methylcellulose, Laxative, (CITRUCEL PO) Take by mouth daily.        . metoprolol (LOPRESSOR) 50 MG tablet Take 0.5 tablets (25 mg total) by mouth 2 (two) times daily. 1/2 bid  30 tablet  5  . nitroGLYCERIN (NITROSTAT) 0.4 MG SL tablet Place 1 tablet (0.4 mg total) under the tongue every 5 (five) minutes as needed for chest pain.  90 tablet  3  . nystatin-triamcinolone ointment (MYCOLOG) Apply topically 2 (two) times daily.  30 g  0  . Omega-3 Fatty Acids (FISH OIL) 1000 MG CAPS Take by mouth daily. Taking 1200 daily      . rosuvastatin (CRESTOR) 10 MG tablet Take 10 mg by mouth as directed. 1/2 tablet daily        Allergies    Allergen Reactions  . Codeine   . Lipitor (Atorvastatin Calcium)   . Simvastatin     Patient Active Problem List  Diagnosis  . Old MI (myocardial infarction)  . Hypercholesterolemia  . Osteoporosis  . Hyperkalemia  . Senile osteoporosis  . Breast cyst  . Hypertension  . Myocardial infarction  . Hyperlipidemia  . Ischemic heart disease  . Vaginal atrophy    History  Smoking status  . Never Smoker   Smokeless tobacco  . Never Used    History  Alcohol Use No    Family History  Problem Relation Age of Onset  . Hypertension Mother   . Heart disease Mother   . Osteoporosis Mother   . Heart disease Father   . Colon cancer Father   . Breast cancer Maternal Grandmother     Age unknown  . Cancer Paternal Grandmother     Unknown type    Review of Systems: Constitutional: no fever chills diaphoresis or fatigue or change in weight.  Head and neck: no hearing loss, no epistaxis, no photophobia or visual disturbance. Respiratory: No cough, shortness of breath or wheezing. Cardiovascular: No chest pain peripheral edema, palpitations. Gastrointestinal: No abdominal distention,  no abdominal pain, no change in bowel habits hematochezia or melena. Genitourinary: No dysuria, no frequency, no urgency, no nocturia. Musculoskeletal:No arthralgias, no back pain, no gait disturbance or myalgias. Neurological: No dizziness, no headaches, no numbness, no seizures, no syncope, no weakness, no tremors. Hematologic: No lymphadenopathy, no easy bruising. Psychiatric: No confusion, no hallucinations, no sleep disturbance.    Physical Exam: Filed Vitals:   05/31/12 1017  BP: 130/64  Pulse: 69  Resp: 18   the general appearance reveals a well-developed well-nourished woman in no distress.The head and neck exam reveals pupils equal and reactive.  Extraocular movements are full.  There is no scleral icterus.  The mouth and pharynx are normal.  The neck is supple.  The carotids reveal no  bruits.  The jugular venous pressure is normal.  The  thyroid is not enlarged.  There is no lymphadenopathy.  The chest is clear to percussion and auscultation.  There are no rales or rhonchi.  Expansion of the chest is symmetrical.  The precordium is quiet.  The first heart sound is normal.  The second heart sound is physiologically split.  There is no murmur gallop rub or click.  There is no abnormal lift or heave.  The abdomen is soft and nontender.  The bowel sounds are normal.  The liver and spleen are not enlarged.  There are no abdominal masses.  There are no abdominal bruits.  Extremities reveal good pedal pulses.  There is no phlebitis or edema.  There is no cyanosis or clubbing.  Strength is normal and symmetrical in all extremities.  There is no lateralizing weakness.  There are no sensory deficits.  The skin is warm and dry.  There is no rash.  EKG shows normal sinus rhythm at 69 per minute and is within normal limits   Assessment / Plan: Overall the patient is doing well except for the increased stress regarding her husband's liver cancer.  The patient has gained 6 pounds since last visit which she attributes to eating because of stress and also not having much time to exercise.  Will try harder to get back to the [facility] for exercise and to lose weight.  Continue same medication except for slight reduction in dose of Crestor will follow up her lipid panel and liver function studies in 4 months along with an office visit

## 2012-05-31 NOTE — Assessment & Plan Note (Signed)
Blood pressure was remaining stable on current therapy.  No headaches.  No dizziness or syncope.  No symptoms of CHF.  No racing of her heart

## 2012-05-31 NOTE — Assessment & Plan Note (Signed)
The patient has a history of hypercholesterolemia.  She has been on Crestor 10 mg Wednesday and Sunday and 5 mg the other days.  This is an increase from what she had been on.  Her liver function studies are slightly elevated and we will cut back on her Crestor dose down to 5 mg every day and observe response of liver function studies next time.  She is worried about her liver because of her husbands history of newly diagnosed liver cancer.

## 2012-05-31 NOTE — Assessment & Plan Note (Signed)
The patient has not been experiencing any recurrent chest pain or angina.  Her EKG today is normal

## 2012-05-31 NOTE — Patient Instructions (Signed)
Decrease your Crestor to 10 mg 1/2 tablet daily  Your physician recommends that you schedule a follow-up appointment in: 4 months with fasting labs (lp/bmet/hfp)

## 2012-06-03 ENCOUNTER — Other Ambulatory Visit: Payer: Self-pay | Admitting: *Deleted

## 2012-06-03 MED ORDER — CLOPIDOGREL BISULFATE 75 MG PO TABS: 75.0000 mg | ORAL_TABLET | Freq: Every day | ORAL | Status: DC

## 2012-06-22 ENCOUNTER — Other Ambulatory Visit: Payer: Self-pay | Admitting: *Deleted

## 2012-06-22 MED ORDER — METOPROLOL TARTRATE 50 MG PO TABS: 25.0000 mg | ORAL_TABLET | Freq: Two times a day (BID) | ORAL | Status: DC

## 2012-09-06 ENCOUNTER — Encounter: Payer: Self-pay | Admitting: Gynecology

## 2012-09-06 ENCOUNTER — Ambulatory Visit (INDEPENDENT_AMBULATORY_CARE_PROVIDER_SITE_OTHER): Payer: Medicare Other | Admitting: Gynecology

## 2012-09-06 DIAGNOSIS — N9089 Other specified noninflammatory disorders of vulva and perineum: Secondary | ICD-10-CM

## 2012-09-06 DIAGNOSIS — A499 Bacterial infection, unspecified: Secondary | ICD-10-CM

## 2012-09-06 DIAGNOSIS — B9689 Other specified bacterial agents as the cause of diseases classified elsewhere: Secondary | ICD-10-CM

## 2012-09-06 DIAGNOSIS — N760 Acute vaginitis: Secondary | ICD-10-CM

## 2012-09-06 LAB — WET PREP FOR TRICH, YEAST, CLUE
Clue Cells Wet Prep HPF POC: NONE SEEN
Trich, Wet Prep: NONE SEEN
Yeast Wet Prep HPF POC: NONE SEEN

## 2012-09-06 MED ORDER — METRONIDAZOLE 500 MG PO TABS: 500.0000 mg | ORAL_TABLET | Freq: Two times a day (BID) | ORAL | Status: DC

## 2012-09-06 NOTE — Progress Notes (Signed)
Patient presents with a one-week history of vaginal irritation and slight discharge. No urinary symptoms such as frequency dysuria or urgency.  Exam with Selena Batten assistant External BUS vagina with atrophic changes. Slight white discharge noted. Bimanual without masses or tenderness.  Assessment and plan: Exam and history with wet prep consistent with bacterial vaginosis. Treat with Flagyl 500 mg twice a day x7 days at her choice. Alcohol avoidance reviewed. Follow up if symptoms persist worsen or recur.

## 2012-09-06 NOTE — Patient Instructions (Signed)
Take metronidazole antibiotic pill twice daily for 7 days. Avoid alcohol while taking. Follow up if symptoms persist, worsen or recur.

## 2012-09-22 ENCOUNTER — Other Ambulatory Visit: Payer: Self-pay | Admitting: *Deleted

## 2012-09-22 MED ORDER — ROSUVASTATIN CALCIUM 10 MG PO TABS: 5.0000 mg | ORAL_TABLET | Freq: Every day | ORAL | Status: DC

## 2012-10-04 ENCOUNTER — Other Ambulatory Visit: Payer: Medicare Other

## 2012-10-04 ENCOUNTER — Ambulatory Visit: Payer: Medicare Other | Admitting: Cardiology

## 2012-10-12 ENCOUNTER — Encounter: Payer: Self-pay | Admitting: Cardiology

## 2012-10-12 ENCOUNTER — Ambulatory Visit (INDEPENDENT_AMBULATORY_CARE_PROVIDER_SITE_OTHER): Payer: Medicare Other | Admitting: Cardiology

## 2012-10-12 ENCOUNTER — Other Ambulatory Visit (INDEPENDENT_AMBULATORY_CARE_PROVIDER_SITE_OTHER): Payer: Medicare Other

## 2012-10-12 VITALS — BP 122/68 | HR 72 | Ht 68.0 in | Wt 155.8 lb

## 2012-10-12 DIAGNOSIS — I259 Chronic ischemic heart disease, unspecified: Secondary | ICD-10-CM

## 2012-10-12 DIAGNOSIS — E785 Hyperlipidemia, unspecified: Secondary | ICD-10-CM

## 2012-10-12 DIAGNOSIS — I119 Hypertensive heart disease without heart failure: Secondary | ICD-10-CM

## 2012-10-12 DIAGNOSIS — E7800 Pure hypercholesterolemia, unspecified: Secondary | ICD-10-CM

## 2012-10-12 NOTE — Progress Notes (Signed)
Leland Her Date of Birth:  06-12-1940 [facility] [address] [place], [place]  [address] [phone]  Fax   [phone]  HPI: This pleasant 72 year old woman is seen for a scheduled four-month followup office visit. She has been feeling well since last visit. She has a history of ischemic heart disease and she had a non-Q-wave myocardial infarction in 2003. She did undergo cardiac catheterization but did not require percutaneous intervention. He has a history of hypercholesterolemia and is on low-dose Crestor.  Since last visit her husband died of metastatic liver cancer.  He died on 2012-10-09.   Current Outpatient Prescriptions  Medication Sig Dispense Refill  . aspirin 81 MG tablet Take 81 mg by mouth daily.        . Calcium-Vitamin D (POSTURE-D PO) Take 600 mg by mouth 2 (two) times daily.        . Cholecalciferol (VITAMIN D) 1000 UNITS capsule Take 1,000 Units by mouth daily.       . clopidogrel (PLAVIX) 75 MG tablet Take 1 tablet (75 mg total) by mouth daily.  30 tablet  5  . Coenzyme Q10 (COQ10) 100 MG CAPS Take 50 mg by mouth 2 (two) times daily.       . Methylcellulose, Laxative, (CITRUCEL PO) Take by mouth daily.        . metoprolol (LOPRESSOR) 50 MG tablet Take 0.5 tablets (25 mg total) by mouth 2 (two) times daily. 1/2 bid  30 tablet  5  . nitroGLYCERIN (NITROSTAT) 0.4 MG SL tablet Place 1 tablet (0.4 mg total) under the tongue every 5 (five) minutes as needed for chest pain.  90 tablet  3  . Omega-3 Fatty Acids (FISH OIL) 1000 MG CAPS Take by mouth daily. Taking 1200 daily      . rosuvastatin (CRESTOR) 10 MG tablet Take 0.5 tablets (5 mg total) by mouth daily.  30 tablet  3   No current facility-administered medications for this visit.    Allergies  Allergen Reactions  . Codeine   . Lipitor (Atorvastatin Calcium)   . Simvastatin     Patient Active Problem List   Diagnosis Date Noted  . Vaginal atrophy 05/03/2012  . Hypertension   .  Myocardial infarction   . Hyperlipidemia   . Ischemic heart disease   . 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 Breast cyst 02/27/2011  . Hyperkalemia 01/28/2011  . Old MI (myocardial infarction) 08/08/2010  . Hypercholesterolemia 08/08/2010  . Osteoporosis 08/08/2010    History  Smoking status  . Never Smoker   Smokeless tobacco  . Never Used    History  Alcohol Use No    Family History  Problem Relation Age of Onset  . Hypertension Mother   . Heart disease Mother   . Osteoporosis Mother   . Heart disease Father   . Colon cancer Father   . Breast cancer Maternal Grandmother     Age unknown  . Cancer Paternal Grandmother     Unknown type    Review of Systems: The patient denies any heat or cold intolerance.  No weight gain or weight loss.  The patient denies headaches or blurry vision.  There is no cough or sputum production.  The patient denies dizziness.  There is no hematuria or hematochezia.  The patient denies any muscle aches or arthritis.  The patient denies any rash.  The patient denies frequent falling or instability.  There is no history of depression or anxiety.  All other systems  were reviewed and are negative.   Physical Exam: Filed Vitals:   10/12/12 0944  BP: 122/68  Pulse: 72   the general appearance reveals a well-developed well-nourished woman in no distress.The head and neck exam reveals pupils equal and reactive.  Extraocular movements are full.  There is no scleral icterus.  The mouth and pharynx are normal.  The neck is supple.  The carotids reveal no bruits.  The jugular venous pressure is normal.  The  thyroid is not enlarged.  There is no lymphadenopathy.  The chest is clear to percussion and auscultation.  There are no rales or rhonchi.  Expansion of the chest is symmetrical.  The precordium is quiet.  The first heart sound is normal.  The second heart sound is physiologically split.  There is no murmur gallop rub or click.  There is no abnormal lift or  heave.  The abdomen is soft and nontender.  The bowel sounds are normal.  The liver and spleen are not enlarged.  There are no abdominal masses.  There are no abdominal bruits.  Extremities reveal good pedal pulses.  There is no phlebitis or edema.  There is no cyanosis or clubbing.  Strength is normal and symmetrical in all extremities.  There is no lateralizing weakness.  There are no sensory deficits.  The skin is warm and dry.  There is no rash.     Assessment / Plan: Continue same medication.  She has trace edema in her feet and has been eating more salty foods which she will now try to avoid. Recheck in 4 months for followup office visit lipid panel hepatic function panel and basal metabolic panel.  Prior to next visit I would expect that she would be back to making her regular trips to the [facility] for exercise.

## 2012-10-12 NOTE — Patient Instructions (Addendum)
REDUCE HIGH SODIUM FOODS LIKE CANNED SOUP, GRAVY, SAUCES, READY PREPARED FOODS LIKE FROZEN FOODS; LEAN CUISINE, LASAGNA. BACON, SAUSAGE, LUNCH MEAT, FAST FOODS..hotdogs and chips,  Your physician recommends that you continue on your current medications as directed. Please refer to the Current Medication list given to you today.  Your physician wants you to follow-up in: 4 months  You will receive a reminder letter in the mail two months in advance. If you don't receive a letter, please call our office to schedule the follow-up appointment.

## 2012-10-12 NOTE — Assessment & Plan Note (Signed)
The patient has not been experiencing any recurrent chest pain or angina. 

## 2012-10-12 NOTE — Assessment & Plan Note (Signed)
The patient has a past history of hyperlipidemia.  Since last visit she has lost a lot of weight from 171 down to 155.  A lot of this is attributable to the stress of her husband's lengthy illness before his death.  She has not been exercising.  We're awaiting results of fasting lab work today.

## 2012-10-13 LAB — BASIC METABOLIC PANEL
BUN: 15 mg/dL (ref 6–23)
Calcium: 8.9 mg/dL (ref 8.4–10.5)
GFR: 61.93 mL/min (ref >60.00)
Glucose, Bld: 96 mg/dL (ref 70–99)
Sodium: 138 mEq/L (ref 135–145)

## 2012-10-13 LAB — LIPID PANEL
Cholesterol: 158 mg/dL (ref 0–200)
HDL: 70.2 mg/dL (ref >39.00)

## 2012-10-13 LAB — HEPATIC FUNCTION PANEL
ALT: 18 U/L (ref 0–35)
AST: 29 U/L (ref 0–37)
Total Bilirubin: 0.7 mg/dL (ref 0.3–1.2)
Total Protein: 6.4 g/dL (ref 6.0–8.3)

## 2012-10-13 NOTE — Progress Notes (Signed)
Quick Note:  Please report to patient. The recent labs are stable. Continue same medication and careful diet. ______ 

## 2012-11-03 ENCOUNTER — Other Ambulatory Visit: Payer: Self-pay | Admitting: *Deleted

## 2012-11-03 MED ORDER — CLOPIDOGREL BISULFATE 75 MG PO TABS: 75.0000 mg | ORAL_TABLET | Freq: Every day | ORAL | Status: DC

## 2012-11-29 ENCOUNTER — Other Ambulatory Visit: Payer: Self-pay | Admitting: Internal Medicine

## 2012-11-29 ENCOUNTER — Ambulatory Visit
Admission: RE | Admit: 2012-11-29 | Discharge: 2012-11-29 | Disposition: A | Discharge status: Home / Self Care | Payer: Medicare Other | Source: Ambulatory Visit | Attending: Internal Medicine | Admitting: Internal Medicine

## 2012-11-29 DIAGNOSIS — R1013 Epigastric pain: Secondary | ICD-10-CM

## 2012-12-21 ENCOUNTER — Other Ambulatory Visit: Payer: Self-pay | Admitting: *Deleted

## 2012-12-21 MED ORDER — METOPROLOL TARTRATE 50 MG PO TABS: 25.0000 mg | ORAL_TABLET | Freq: Two times a day (BID) | ORAL | Status: DC

## 2013-02-11 ENCOUNTER — Ambulatory Visit: Payer: Medicare Other | Admitting: Cardiology

## 2013-02-22 ENCOUNTER — Ambulatory Visit: Payer: Medicare Other | Admitting: Cardiology

## 2013-03-01 ENCOUNTER — Telehealth: Payer: Self-pay | Admitting: *Deleted

## 2013-03-01 ENCOUNTER — Other Ambulatory Visit (INDEPENDENT_AMBULATORY_CARE_PROVIDER_SITE_OTHER): Payer: Medicare Other

## 2013-03-01 ENCOUNTER — Ambulatory Visit (INDEPENDENT_AMBULATORY_CARE_PROVIDER_SITE_OTHER): Payer: Medicare Other | Admitting: Cardiology

## 2013-03-01 ENCOUNTER — Encounter: Payer: Self-pay | Admitting: Cardiology

## 2013-03-01 VITALS — BP 131/70 | HR 84 | Ht 68.0 in | Wt 163.0 lb

## 2013-03-01 DIAGNOSIS — E7800 Pure hypercholesterolemia, unspecified: Secondary | ICD-10-CM

## 2013-03-01 DIAGNOSIS — E785 Hyperlipidemia, unspecified: Secondary | ICD-10-CM

## 2013-03-01 DIAGNOSIS — I252 Old myocardial infarction: Secondary | ICD-10-CM

## 2013-03-01 DIAGNOSIS — I209 Angina pectoris, unspecified: Secondary | ICD-10-CM

## 2013-03-01 DIAGNOSIS — I119 Hypertensive heart disease without heart failure: Secondary | ICD-10-CM

## 2013-03-01 DIAGNOSIS — I2510 Atherosclerotic heart disease of native coronary artery without angina pectoris: Secondary | ICD-10-CM

## 2013-03-01 LAB — BASIC METABOLIC PANEL
BUN: 15 mg/dL (ref 6–23)
CO2: 28 mEq/L (ref 19–32)
Chloride: 105 mEq/L (ref 96–112)
Creatinine, Ser: 1 mg/dL (ref 0.4–1.2)

## 2013-03-01 LAB — LIPID PANEL
LDL Cholesterol: 72 mg/dL (ref 0–99)
Total CHOL/HDL Ratio: 2
Triglycerides: 58 mg/dL (ref 0.0–149.0)

## 2013-03-01 LAB — HEPATIC FUNCTION PANEL
Albumin: 3.8 g/dL (ref 3.5–5.2)
Alkaline Phosphatase: 27 U/L — ABNORMAL LOW (ref 39–117)
Bilirubin, Direct: 0.1 mg/dL (ref 0.0–0.3)

## 2013-03-01 MED ORDER — NITROGLYCERIN 0.4 MG SL SUBL: 0.4000 mg | SUBLINGUAL_TABLET | SUBLINGUAL | Status: DC | PRN

## 2013-03-01 NOTE — Telephone Encounter (Signed)
Advised patient of lab results  

## 2013-03-01 NOTE — Assessment & Plan Note (Signed)
Her blood pressure is remaining stable on current therapy.  No symptoms of CHF.  No dizziness or syncope or headaches.

## 2013-03-01 NOTE — Assessment & Plan Note (Signed)
The patient has not been having any recurrent chest pain or angina.  Her sublingual nitroglycerin are outdated and we gave her a new prescription today

## 2013-03-01 NOTE — Progress Notes (Signed)
Quick Note:  Please report to patient. The recent labs are stable. Continue same medication and careful diet. ______ 

## 2013-03-01 NOTE — Telephone Encounter (Signed)
Message copied by Burnell Blanks on Tue Mar 01, 2013  5:44 PM ------      Message from: Cassell Clement      Created: Tue Mar 01, 2013  4:07 PM       Please report to patient.  The recent labs are stable. Continue same medication and careful diet. ------

## 2013-03-01 NOTE — Patient Instructions (Signed)
Your physician recommends that you continue on your current medications as directed. Please refer to the Current Medication list given to you today.  Your physician recommends that you schedule a follow-up appointment in: 4 months with fasting labs (lp/bmet/hfp)   INCREASE YOUR ACTIVITY

## 2013-03-01 NOTE — Assessment & Plan Note (Signed)
She has a history of hyperlipidemia.  She has not been getting as much regular exercise.  Her weight is up 8 pounds since last visit.  She would prefer to be around 160.  Blood Work today is pending

## 2013-03-01 NOTE — Progress Notes (Signed)
Leland Her Date of Birth:  [birth year] [address] [place], [place]  [address] [phone]  Fax   [phone]  HPI: This pleasant 72 year old woman is seen for a scheduled four-month followup office visit. She has been feeling well since last visit. She has a history of ischemic heart disease and she had a non-Q-wave myocardial infarction in 2003. She did undergo cardiac catheterization but did not require percutaneous intervention. He has a history of hypercholesterolemia and is on low-dose Crestor.  She has been a widow since June 19, 2014when her husband died of liver cancer.   Current Outpatient Prescriptions  Medication Sig Dispense Refill  . aspirin 81 MG tablet Take 81 mg by mouth daily.        . Calcium-Vitamin D (POSTURE-D PO) Take 600 mg by mouth 2 (two) times daily.        . Cholecalciferol (VITAMIN D) 1000 UNITS capsule Take 1,000 Units by mouth daily.       . clopidogrel (PLAVIX) 75 MG tablet Take 1 tablet (75 mg total) by mouth daily.  30 tablet  3  . Coenzyme Q10 (COQ10) 100 MG CAPS Take 50 mg by mouth 2 (two) times daily.       . Methylcellulose, Laxative, (CITRUCEL PO) Take by mouth daily.        . metoprolol (LOPRESSOR) 50 MG tablet Take 0.5 tablets (25 mg total) by mouth 2 (two) times daily.  30 tablet  3  . nitroGLYCERIN (NITROSTAT) 0.4 MG SL tablet Place 1 tablet (0.4 mg total) under the tongue every 5 (five) minutes as needed for chest pain.  25 tablet  11  . Omega-3 Fatty Acids (FISH OIL) 1000 MG CAPS Take by mouth daily. Taking 1200 daily      . Probiotic Product (ALIGN PO) Take by mouth.      . rosuvastatin (CRESTOR) 5 MG tablet Take 5 mg by mouth daily at 6 PM.       No current facility-administered medications for this visit.    Allergies  Allergen Reactions  . Codeine   . Lipitor [Atorvastatin Calcium]   . Simvastatin     Patient Active Problem List   Diagnosis Date Noted  . Vaginal atrophy 05/03/2012  . Hypertension   .  Myocardial infarction   . Hyperlipidemia   . Ischemic heart disease   . 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 Breast cyst 02/27/2011  . Hyperkalemia 01/28/2011  . Old MI (myocardial infarction) 08/08/2010  . Hypercholesterolemia 08/08/2010  . Osteoporosis 08/08/2010    History  Smoking status  . Never Smoker   Smokeless tobacco  . Never Used    History  Alcohol Use No    Family History  Problem Relation Age of Onset  . Hypertension Mother   . Heart disease Mother   . Osteoporosis Mother   . Heart disease Father   . Colon cancer Father   . Breast cancer Maternal Grandmother     Age unknown  . Cancer Paternal Grandmother     Unknown type    Review of Systems: The patient denies any heat or cold intolerance.  No weight gain or weight loss.  The patient denies headaches or blurry vision.  There is no cough or sputum production.  The patient denies dizziness.  There is no hematuria or hematochezia.  The patient denies any muscle aches or arthritis.  The patient denies any rash.  The patient denies frequent falling or instability.  There is  no history of depression or anxiety.  All other systems were reviewed and are negative.   Physical Exam: Filed Vitals:   03/01/13 0948  BP: 131/70  Pulse: 84   the general appearance reveals a well-developed well-nourished woman in no distress.The head and neck exam reveals pupils equal and reactive.  Extraocular movements are full.  There is no scleral icterus.  The mouth and pharynx are normal.  The neck is supple.  The carotids reveal no bruits.  The jugular venous pressure is normal.  The  thyroid is not enlarged.  There is no lymphadenopathy.  The chest is clear to percussion and auscultation.  There are no rales or rhonchi.  Expansion of the chest is symmetrical.  The precordium is quiet.  The first heart sound is normal.  The second heart sound is physiologically split.  There is no murmur gallop rub or click.  There is no abnormal lift or  heave.  The abdomen is soft and nontender.  The bowel sounds are normal.  The liver and spleen are not enlarged.  There are no abdominal masses.  There are no abdominal bruits.  Extremities reveal good pedal pulses.  There is no phlebitis or edema.  There is no cyanosis or clubbing.  Strength is normal and symmetrical in all extremities.  There is no lateralizing weakness.  There are no sensory deficits.  The skin is warm and dry.  There is no rash.     Assessment / Plan: Continue same medication.  Watch diet carefully in terms of calories and sodium.  Blood work today is pending Recheck in 4 months for followup office visit lipid panel hepatic function panel and basal metabolic panel.  Prior to next visit I would expect that she would be back to making her regular trips to the [facility] for exercise.

## 2013-03-08 ENCOUNTER — Other Ambulatory Visit: Payer: Self-pay | Admitting: *Deleted

## 2013-03-08 MED ORDER — CLOPIDOGREL BISULFATE 75 MG PO TABS: 75.0000 mg | ORAL_TABLET | Freq: Every day | ORAL | Status: DC

## 2013-04-13 ENCOUNTER — Ambulatory Visit (INDEPENDENT_AMBULATORY_CARE_PROVIDER_SITE_OTHER): Payer: Medicare Other | Admitting: Gynecology

## 2013-04-13 ENCOUNTER — Encounter: Payer: Self-pay | Admitting: Gynecology

## 2013-04-13 VITALS — BP 130/80 | Ht 68.0 in | Wt 168.0 lb

## 2013-04-13 DIAGNOSIS — N952 Postmenopausal atrophic vaginitis: Secondary | ICD-10-CM

## 2013-04-13 DIAGNOSIS — M8580 Other specified disorders of bone density and structure, unspecified site: Secondary | ICD-10-CM

## 2013-04-13 DIAGNOSIS — M899 Disorder of bone, unspecified: Secondary | ICD-10-CM

## 2013-04-13 NOTE — Progress Notes (Signed)
RAINI [name] 11-15-1940 [id number]        72 y.o.  [name] for followup exam.  Former patient of Dr. Eda [name]. Several issues below.  Past medical history,surgical history, problem list, medications, allergies, family history and social history were all reviewed and documented in the EPIC chart.  ROS:  Performed and pertinent positives and negatives are included in the history, assessment and plan .  Exam: Kayla [name] assistant Filed Vitals:   04/13/13 1135  BP: 130/80  Height: 5\' 8"  (1.727 m)  Weight: 168 lb (76.204 kg)   General appearance  Normal Skin grossly normal Head/Neck normal with no cervical or supraclavicular adenopathy thyroid normal Lungs  clear Cardiac RR, without RMG Abdominal  soft, nontender, without masses, organomegaly or hernia Breasts  examined lying and sitting without masses, retractions, discharge or axillary adenopathy. Pelvic  Ext/BUS/vagina  Normal with atrophic changes   Adnexa  Without masses or tenderness    Anus and perineum  Normal   Rectovaginal  Normal sphincter tone without palpated masses or tenderness.    Assessment/Plan:  72 y.o. G35P2002 female for followup exam.   1. 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 Patient without significant symptoms of hot flushes, night sweats, vaginal dryness. Is not sexually active. Will continue to monitor. 2. History of osteopenia. Most recent DEXA 2012 normal. Had been on Fosamax for approximately 5 years per Dr. Verl [name] note. Is currently on drug-free holiday. Recommend repeat DEXA in another 1-2 years. Increase calcium and vitamin D reviewed. 3. Pap smear 2011. Do Pap smear done today. No history of abnormal Pap smears previously. Status post hysterectomy for benign indications. Over the age of 81. Discussed current screening guidelines. We will plan to stop screening and she is comfortable with this. 4. Mammography report in chart 10/2011. Patient feels that she  had it this past year. We'll check with [facility]. Stressed need to continue with annual mammography. SBE monthly reviewed. 5. Colonoscopy 2013. Repeat at their recommended interval. 6. Health maintenance. No blood work done today as it is all done through her primary physician's office. Followup one year, sooner as needed.   Note: This document was prepared with digital dictation and possible smart phrase technology. Any transcriptional errors that result from this process are unintentional.   Kayla Lords MD, 12:04 PM 04/13/2013

## 2013-04-13 NOTE — Patient Instructions (Signed)
Followup in one year for annual exam, sooner any issues

## 2013-04-14 LAB — URINALYSIS W MICROSCOPIC + REFLEX CULTURE
Bacteria, UA: NONE SEEN
Casts: NONE SEEN
Crystals: NONE SEEN
Ketones, ur: NEGATIVE mg/dL
Nitrite: NEGATIVE
Specific Gravity, Urine: 1.005 (ref 1.005–1.030)
Squamous Epithelial / LPF: NONE SEEN
pH: 5.5 (ref 5.0–8.0)

## 2013-04-18 ENCOUNTER — Encounter: Payer: Self-pay | Admitting: Gynecology

## 2013-04-20 ENCOUNTER — Encounter: Payer: Self-pay | Admitting: Gynecology

## 2013-04-25 ENCOUNTER — Other Ambulatory Visit: Payer: Self-pay | Admitting: Cardiology

## 2013-07-01 ENCOUNTER — Other Ambulatory Visit: Payer: Self-pay

## 2013-07-01 MED ORDER — CLOPIDOGREL BISULFATE 75 MG PO TABS: 75.0000 mg | ORAL_TABLET | Freq: Every day | ORAL | Status: DC

## 2013-07-04 ENCOUNTER — Encounter: Payer: Self-pay | Admitting: Cardiology

## 2013-07-04 ENCOUNTER — Ambulatory Visit (INDEPENDENT_AMBULATORY_CARE_PROVIDER_SITE_OTHER): Payer: Medicare Other | Admitting: Cardiology

## 2013-07-04 VITALS — BP 125/73 | HR 76 | Ht 68.0 in | Wt 168.0 lb

## 2013-07-04 DIAGNOSIS — I259 Chronic ischemic heart disease, unspecified: Secondary | ICD-10-CM

## 2013-07-04 DIAGNOSIS — E7800 Pure hypercholesterolemia, unspecified: Secondary | ICD-10-CM

## 2013-07-04 DIAGNOSIS — I2510 Atherosclerotic heart disease of native coronary artery without angina pectoris: Secondary | ICD-10-CM

## 2013-07-04 DIAGNOSIS — I119 Hypertensive heart disease without heart failure: Secondary | ICD-10-CM

## 2013-07-04 DIAGNOSIS — E785 Hyperlipidemia, unspecified: Secondary | ICD-10-CM

## 2013-07-04 LAB — BASIC METABOLIC PANEL
BUN: 15 mg/dL (ref 6–23)
CALCIUM: 9.6 mg/dL (ref 8.4–10.5)
CO2: 27 mEq/L (ref 19–32)
CREATININE: 1 mg/dL (ref 0.4–1.2)
Chloride: 106 mEq/L (ref 96–112)
GFR: 58.22 mL/min — AB (ref >60.00)
Glucose, Bld: 103 mg/dL — ABNORMAL HIGH (ref 70–99)
Potassium: 4.7 mEq/L (ref 3.5–5.1)
Sodium: 140 mEq/L (ref 135–145)

## 2013-07-04 LAB — HEPATIC FUNCTION PANEL
ALBUMIN: 4 g/dL (ref 3.5–5.2)
ALK PHOS: 27 U/L — AB (ref 39–117)
ALT: 15 U/L (ref 0–35)
AST: 27 U/L (ref 0–37)
Bilirubin, Direct: 0.2 mg/dL (ref 0.0–0.3)
TOTAL PROTEIN: 6.4 g/dL (ref 6.0–8.3)
Total Bilirubin: 0.9 mg/dL (ref 0.3–1.2)

## 2013-07-04 LAB — LIPID PANEL
CHOLESTEROL: 160 mg/dL (ref 0–200)
HDL: 71.6 mg/dL (ref >39.00)
LDL Cholesterol: 77 mg/dL (ref 0–99)
TRIGLYCERIDES: 58 mg/dL (ref 0.0–149.0)
Total CHOL/HDL Ratio: 2
VLDL: 11.6 mg/dL (ref 0.0–40.0)

## 2013-07-04 NOTE — Progress Notes (Signed)
Kayla Kayla [name] Date of Birth:  28-Jul-1940 [address] [place], [place]  [address] [phone]  Fax   [phone]  HPI: This pleasant 73 year old woman is seen for a scheduled four-month followup office visit. She has been feeling well since last visit. She has a history of ischemic heart disease and she had a non-Q-wave myocardial infarction in 2003. She did undergo cardiac catheterization but did not require percutaneous intervention. He has a history of hypercholesterolemia and is on low-dose Crestor.  She has been a widow since 2014-06-17when Kayla [name] husband died of liver cancer.  She is a member of the [employer] but has not been attending frequently.  Kayla [name] weight is up 5 pounds since last visit.   Current Outpatient Prescriptions  Medication Sig Dispense Refill  . aspirin 81 MG tablet Take 81 mg by mouth daily.        . Calcium-Vitamin D (POSTURE-D PO) Take 600 mg by mouth 2 (two) times daily.        . Cholecalciferol (VITAMIN D) 1000 UNITS capsule Take 1,000 Units by mouth daily.       . clopidogrel (PLAVIX) 75 MG tablet Take 1 tablet (75 mg total) by mouth daily.  30 tablet  3  . Coenzyme Q10 (COQ10) 100 MG CAPS Take 50 mg by mouth 2 (two) times daily.       . CRESTOR 5 MG tablet TAKE 1 TABLET BY MOUTH DAILY.  30 tablet  6  . Methylcellulose, Laxative, (CITRUCEL PO) Take by mouth daily.        . metoprolol (LOPRESSOR) 50 MG tablet TAKE (1/2) TABLET BY MOUTH TWICE DAILY.  30 tablet  6  . nitroGLYCERIN (NITROSTAT) 0.4 MG SL tablet Place 1 tablet (0.4 mg total) under the tongue every 5 (five) minutes as needed for chest pain.  25 tablet  11  . Omega-3 Fatty Acids (FISH OIL) 1000 MG CAPS Take by mouth daily. Taking 1200 daily      . Probiotic Product (ALIGN PO) Take by mouth.       No current facility-administered medications for this visit.    Allergies  Allergen Reactions  . Codeine Nausea Only  . Lipitor [Atorvastatin Calcium]   . Simvastatin     Patient Active  Problem List   Diagnosis Date Noted  . Benign hypertensive heart disease without heart failure 03/01/2013  . Vaginal atrophy 05/03/2012  . Myocardial infarction   . Hyperlipidemia   . Ischemic heart disease   . 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 Breast cyst 02/27/2011  . Hyperkalemia 01/28/2011  . Old MI (myocardial infarction) 08/08/2010  . Hypercholesterolemia 08/08/2010  . Osteoporosis 08/08/2010    History  Smoking status  . Never Smoker   Smokeless tobacco  . Never Used    History  Alcohol Use No    Family History  Problem Relation Age of Onset  . Hypertension Mother   . Heart disease Mother   . Osteoporosis Mother   . Heart disease Father   . Colon cancer Father   . Breast cancer Maternal Grandmother     Age unknown  . Cancer Paternal Grandmother     Unknown type    Review of Systems: The patient denies any heat or cold intolerance.  No weight gain or weight loss.  The patient denies headaches or blurry vision.  There is no cough or sputum production.  The patient denies dizziness.  There is no hematuria or hematochezia.  The  patient denies any muscle aches or arthritis.  The patient denies any rash.  The patient denies frequent falling or instability.  There is no history of depression or anxiety.  All other systems were reviewed and are negative.   Physical Exam: Filed Vitals:   07/04/13 0919  BP: 125/73  Pulse: 76   the general appearance reveals a well-developed well-nourished woman in no distress.The head and neck exam reveals pupils equal and reactive.  Extraocular movements are full.  There is no scleral icterus.  The mouth and pharynx are normal.  The neck is supple.  The carotids reveal no bruits.  The jugular venous pressure is normal.  The  thyroid is not enlarged.  There is no lymphadenopathy.  The chest is clear to percussion and auscultation.  There are no rales or rhonchi.  Expansion of the chest is symmetrical.  The precordium is quiet.  The first  heart sound is normal.  The second heart sound is physiologically split.  There is no murmur gallop rub or click.  There is no abnormal lift or heave.  The abdomen is soft and nontender.  The bowel sounds are normal.  The liver and spleen are not enlarged.  There are no abdominal masses.  There are no abdominal bruits.  Extremities reveal good pedal pulses.  There is no phlebitis or edema.  There is no cyanosis or clubbing.  Strength is normal and symmetrical in all extremities.  There is no lateralizing weakness.  There are no sensory deficits.  The skin is warm and dry.  There is no rash.     Assessment / Plan: Continue same medication.  Work harder on weight loss.  [facility]. Lab work today is pending.  Recheck in 4 months for office visit lipid panel hepatic function panel and basal metabolic panel.

## 2013-07-04 NOTE — Assessment & Plan Note (Signed)
Blood pressures remaining stable on current therapy.  No headaches.  No dizziness or syncope.

## 2013-07-04 NOTE — Assessment & Plan Note (Signed)
The patient has not been experiencing any recurrent chest pain or angina.  No palpitations

## 2013-07-04 NOTE — Assessment & Plan Note (Signed)
Patient has a history of hypercholesterolemia and is on Crestor 5 mg daily.  She is also taking coenzyme Q10 50 mg twice a day.  She is not having any myalgias.  We are checking fasting lab work today.

## 2013-07-04 NOTE — Patient Instructions (Addendum)
Will obtain labs today and call you with the results (LP.BMET.HFP)  Your physician wants you to follow-up in: 4 months with fasting labs (lp/bmet/hfp)  You will receive a reminder letter in the mail two months in advance. If you don't receive a letter, please call our office to schedule the follow-up appointment.   Your physician recommends that you continue on your current medications as directed. Please refer to the Current Medication list given to you today.

## 2013-07-04 NOTE — Progress Notes (Signed)
Quick Note:  Please report to patient. The recent labs are stable. Continue same medication and careful diet. Cholesterol is slightly better. ______

## 2013-09-27 ENCOUNTER — Ambulatory Visit
Admission: RE | Admit: 2013-09-27 | Discharge: 2013-09-27 | Disposition: A | Discharge status: Home / Self Care | Payer: Medicare Other | Source: Ambulatory Visit | Attending: Internal Medicine | Admitting: Internal Medicine

## 2013-09-27 ENCOUNTER — Other Ambulatory Visit: Payer: Self-pay | Admitting: Internal Medicine

## 2013-09-27 DIAGNOSIS — M79604 Pain in right leg: Secondary | ICD-10-CM

## 2013-10-26 ENCOUNTER — Other Ambulatory Visit (INDEPENDENT_AMBULATORY_CARE_PROVIDER_SITE_OTHER): Payer: Medicare Other

## 2013-10-26 DIAGNOSIS — I119 Hypertensive heart disease without heart failure: Secondary | ICD-10-CM

## 2013-10-26 DIAGNOSIS — E785 Hyperlipidemia, unspecified: Secondary | ICD-10-CM

## 2013-10-26 LAB — HEPATIC FUNCTION PANEL
ALK PHOS: 21 U/L — AB (ref 39–117)
ALT: 19 U/L (ref 0–35)
AST: 33 U/L (ref 0–37)
Albumin: 3.8 g/dL (ref 3.5–5.2)
BILIRUBIN DIRECT: 0 mg/dL (ref 0.0–0.3)
TOTAL PROTEIN: 6.2 g/dL (ref 6.0–8.3)
Total Bilirubin: 1 mg/dL (ref 0.2–1.2)

## 2013-10-26 LAB — BASIC METABOLIC PANEL
BUN: 16 mg/dL (ref 6–23)
CHLORIDE: 103 meq/L (ref 96–112)
CO2: 24 mEq/L (ref 19–32)
CREATININE: 0.9 mg/dL (ref 0.4–1.2)
Calcium: 9.2 mg/dL (ref 8.4–10.5)
GFR: 64.11 mL/min (ref >60.00)
Glucose, Bld: 79 mg/dL (ref 70–99)
Potassium: 3.8 mEq/L (ref 3.5–5.1)
Sodium: 138 mEq/L (ref 135–145)

## 2013-10-26 LAB — LIPID PANEL
CHOL/HDL RATIO: 2
CHOLESTEROL: 165 mg/dL (ref 0–200)
HDL: 74.3 mg/dL (ref >39.00)
LDL Cholesterol: 81 mg/dL (ref 0–99)
NonHDL: 90.7
TRIGLYCERIDES: 51 mg/dL (ref 0.0–149.0)
VLDL: 10.2 mg/dL (ref 0.0–40.0)

## 2013-10-26 NOTE — Progress Notes (Signed)
Quick Note:  Please make copy of labs for patient visit. ______ 

## 2013-11-02 ENCOUNTER — Ambulatory Visit (INDEPENDENT_AMBULATORY_CARE_PROVIDER_SITE_OTHER): Payer: Medicare Other | Admitting: Cardiology

## 2013-11-02 ENCOUNTER — Encounter: Payer: Self-pay | Admitting: Cardiology

## 2013-11-02 ENCOUNTER — Other Ambulatory Visit: Payer: Self-pay | Admitting: *Deleted

## 2013-11-02 VITALS — BP 129/72 | HR 62 | Ht 68.0 in | Wt 152.0 lb

## 2013-11-02 DIAGNOSIS — I259 Chronic ischemic heart disease, unspecified: Secondary | ICD-10-CM

## 2013-11-02 DIAGNOSIS — I119 Hypertensive heart disease without heart failure: Secondary | ICD-10-CM

## 2013-11-02 DIAGNOSIS — E7800 Pure hypercholesterolemia, unspecified: Secondary | ICD-10-CM

## 2013-11-02 MED ORDER — CLOPIDOGREL BISULFATE 75 MG PO TABS: 75.0000 mg | ORAL_TABLET | Freq: Every day | ORAL | Status: DC

## 2013-11-02 NOTE — Progress Notes (Signed)
Kayla [name] Date of Birth:  03/23/1941 [facility] [address] [place], [place]  [address] [phone]  Fax   [phone]  HPI: This pleasant 73 year old woman is seen for a scheduled four-month followup office visit. She has been feeling well since last visit. She has a history of ischemic heart disease and she had a non-Q-wave myocardial infarction in 2003. She did undergo cardiac catheterization but did not require percutaneous intervention. He has a history of hypercholesterolemia and is on low-dose Crestor. She has been a widow since June 2014 when her husband died of liver cancer. She is a member of the [employer] but has not been attending frequently.  Since last visit she has gone on a gluten-free diet and has lost 16 pounds intentionally.  Since last visit she has had some orthopedic problems with her right knee.  She improved after a course of prednisone.  She also has a Baker's cyst behind her right knee.   Current Outpatient Prescriptions  Medication Sig Dispense Refill  . aspirin 81 MG tablet Take 81 mg by mouth daily.        . Calcium-Vitamin D (POSTURE-D PO) Take 600 mg by mouth 2 (two) times daily.        . Cholecalciferol (VITAMIN D) 1000 UNITS capsule Take 1,000 Units by mouth daily.       . Coenzyme Q10 (COQ10) 100 MG CAPS Take 50 mg by mouth 2 (two) times daily.       . CRESTOR 5 MG tablet TAKE 1 TABLET BY MOUTH DAILY.  30 tablet  6  . Methylcellulose, Laxative, (CITRUCEL PO) Take by mouth daily.        . metoprolol (LOPRESSOR) 50 MG tablet TAKE (1/2) TABLET BY MOUTH TWICE DAILY.  30 tablet  6  . nitroGLYCERIN (NITROSTAT) 0.4 MG SL tablet Place 1 tablet (0.4 mg total) under the tongue every 5 (five) minutes as needed for chest pain.  25 tablet  11  . Omega-3 Fatty Acids (FISH OIL) 1000 MG CAPS Take by mouth daily. Taking 1200 daily      . clopidogrel (PLAVIX) 75 MG tablet Take 1 tablet (75 mg total) by mouth daily.  30 tablet  3   No current  facility-administered medications for this visit.    Allergies  Allergen Reactions  . Codeine Nausea Only  . Lipitor [Atorvastatin Calcium]   . Simvastatin     Patient Active Problem List   Diagnosis Date Noted  . Benign hypertensive heart disease without heart failure 03/01/2013  . Vaginal atrophy 05/03/2012  . Myocardial infarction   . Hyperlipidemia   . Ischemic heart disease   . 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 Breast cyst 02/27/2011  . Hyperkalemia 01/28/2011  . Old MI (myocardial infarction) 08/08/2010  . Hypercholesterolemia 08/08/2010  . Osteoporosis 08/08/2010    History  Smoking status  . Never Smoker   Smokeless tobacco  . Never Used    History  Alcohol Use No    Family History  Problem Relation Age of Onset  . Hypertension Mother   . Heart disease Mother   . Osteoporosis Mother   . Heart disease Father   . Colon cancer Father   . Breast cancer Maternal Grandmother     Age unknown  . Cancer Paternal Grandmother     Unknown type    Review of Systems: The patient denies any heat or cold intolerance.  No weight gain or weight loss.  The patient  denies headaches or blurry vision.  There is no cough or sputum production.  The patient denies dizziness.  There is no hematuria or hematochezia.  The patient denies any muscle aches or arthritis.  The patient denies any rash.  The patient denies frequent falling or instability.  There is no history of depression or anxiety.  All other systems were reviewed and are negative.   Physical Exam: Filed Vitals:   11/02/13 1020  BP: 129/72  Pulse: 62   general appearance well-developed well-nourished woman in no distress.The head and neck exam reveals pupils equal and reactive.  Extraocular movements are full.  There is no scleral icterus.  The mouth and pharynx are normal.  The neck is supple.  The carotids reveal no bruits.  The jugular venous pressure is normal.  The  thyroid is not enlarged.  There is no  lymphadenopathy.  The chest is clear to percussion and auscultation.  There are no rales or rhonchi.  Expansion of the chest is symmetrical.  The precordium is quiet.  The first heart sound is normal.  The second heart sound is physiologically split.  There is no murmur gallop rub or click.  There is no abnormal lift or heave.  The abdomen is soft and nontender.  The bowel sounds are normal.  The liver and spleen are not enlarged.  There are no abdominal masses.  There are no abdominal bruits.  Extremities reveal good pedal pulses.  There is no phlebitis or edema.  There is no cyanosis or clubbing.  Strength is normal and symmetrical in all extremities.  There is no lateralizing weakness.  There are no sensory deficits.  The skin is warm and dry.  There is no rash.      Assessment / Plan: 1.  Ischemic heart disease status post non-Q-wave myocardial infarction in 2003 2. Hypercholesterolemia  Plan: Continue current medication.  I commended her on her 16 pound weight loss.  Continue gluten-free diet Return in 4 months for office visit EKG lipid panel hepatic function panel and basal metabolic panel.

## 2013-11-02 NOTE — Assessment & Plan Note (Signed)
The patient has not had any recurrent chest pain or angina. 

## 2013-11-02 NOTE — Patient Instructions (Signed)
Your physician recommends that you continue on your current medications as directed. Please refer to the Current Medication list given to you today.  Your physician wants you to follow-up in: 4 months with fasting labs (lp/bmet/hfp) and ekg You will receive a reminder letter in the mail two months in advance. If you don't receive a letter, please call our office to schedule the follow-up appointment.  

## 2013-11-02 NOTE — Assessment & Plan Note (Signed)
The patient is on Crestor.  She is not having any myalgias.  We are checking lab work today

## 2013-11-24 ENCOUNTER — Other Ambulatory Visit: Payer: Self-pay

## 2013-11-24 MED ORDER — ROSUVASTATIN CALCIUM 5 MG PO TABS: ORAL_TABLET | ORAL | Status: DC

## 2013-11-24 MED ORDER — METOPROLOL TARTRATE 50 MG PO TABS: ORAL_TABLET | ORAL | Status: DC

## 2013-11-30 ENCOUNTER — Encounter: Payer: Self-pay | Admitting: Gynecology

## 2014-02-27 ENCOUNTER — Encounter: Payer: Self-pay | Admitting: Cardiology

## 2014-03-03 ENCOUNTER — Other Ambulatory Visit: Payer: Self-pay | Admitting: Cardiology

## 2014-03-03 MED ORDER — CLOPIDOGREL BISULFATE 75 MG PO TABS: 75.0000 mg | ORAL_TABLET | Freq: Every day | ORAL | Status: DC

## 2014-03-06 ENCOUNTER — Other Ambulatory Visit (INDEPENDENT_AMBULATORY_CARE_PROVIDER_SITE_OTHER): Payer: Medicare Other | Admitting: *Deleted

## 2014-03-06 DIAGNOSIS — E7800 Pure hypercholesterolemia, unspecified: Secondary | ICD-10-CM

## 2014-03-06 DIAGNOSIS — I119 Hypertensive heart disease without heart failure: Secondary | ICD-10-CM

## 2014-03-06 LAB — LIPID PANEL
CHOLESTEROL: 156 mg/dL (ref 0–200)
HDL: 61.9 mg/dL (ref >39.00)
LDL CALC: 82 mg/dL (ref 0–99)
NonHDL: 94.1
Total CHOL/HDL Ratio: 3
Triglycerides: 59 mg/dL (ref 0.0–149.0)
VLDL: 11.8 mg/dL (ref 0.0–40.0)

## 2014-03-06 LAB — BASIC METABOLIC PANEL
BUN: 14 mg/dL (ref 6–23)
CHLORIDE: 107 meq/L (ref 96–112)
CO2: 23 mEq/L (ref 19–32)
Calcium: 9.4 mg/dL (ref 8.4–10.5)
Creatinine, Ser: 0.9 mg/dL (ref 0.4–1.2)
GFR: 67.46 mL/min (ref >60.00)
GLUCOSE: 102 mg/dL — AB (ref 70–99)
POTASSIUM: 4 meq/L (ref 3.5–5.1)
Sodium: 142 mEq/L (ref 135–145)

## 2014-03-06 LAB — HEPATIC FUNCTION PANEL
ALT: 13 U/L (ref 0–35)
AST: 26 U/L (ref 0–37)
Albumin: 3.2 g/dL — ABNORMAL LOW (ref 3.5–5.2)
Alkaline Phosphatase: 29 U/L — ABNORMAL LOW (ref 39–117)
BILIRUBIN DIRECT: 0 mg/dL (ref 0.0–0.3)
BILIRUBIN TOTAL: 0.7 mg/dL (ref 0.2–1.2)
Total Protein: 6.1 g/dL (ref 6.0–8.3)

## 2014-03-07 NOTE — Progress Notes (Signed)
Quick Note:  Please report to patient. The recent labs are stable. Continue same medication and careful diet. ______ 

## 2014-03-08 ENCOUNTER — Ambulatory Visit: Payer: Medicare Other | Admitting: Cardiology

## 2014-03-08 ENCOUNTER — Telehealth: Payer: Self-pay | Admitting: Cardiology

## 2014-03-08 NOTE — Telephone Encounter (Signed)
-----   Message from Cassell Clementhomas Brackbill, MD sent at 03/07/2014  1:32 PM EST ----- Please report to patient.  The recent labs are stable. Continue same medication and careful diet.

## 2014-03-08 NOTE — Telephone Encounter (Signed)
Advised patient of lab results  

## 2014-03-08 NOTE — Telephone Encounter (Signed)
Follow Up        Pt returning phone call from [place]. Please call back and advise.

## 2014-04-05 ENCOUNTER — Ambulatory Visit (INDEPENDENT_AMBULATORY_CARE_PROVIDER_SITE_OTHER): Payer: Medicare Other | Admitting: Cardiology

## 2014-04-05 ENCOUNTER — Encounter: Payer: Self-pay | Admitting: Cardiology

## 2014-04-05 VITALS — BP 136/78 | HR 64 | Ht 68.0 in | Wt 159.0 lb

## 2014-04-05 DIAGNOSIS — E7800 Pure hypercholesterolemia, unspecified: Secondary | ICD-10-CM

## 2014-04-05 DIAGNOSIS — E785 Hyperlipidemia, unspecified: Secondary | ICD-10-CM

## 2014-04-05 DIAGNOSIS — I119 Hypertensive heart disease without heart failure: Secondary | ICD-10-CM

## 2014-04-05 DIAGNOSIS — I259 Chronic ischemic heart disease, unspecified: Secondary | ICD-10-CM

## 2014-04-05 NOTE — Assessment & Plan Note (Signed)
Blood pressure stable on current therapy.  No dizziness or syncope.

## 2014-04-05 NOTE — Assessment & Plan Note (Signed)
Despite weight gain her lipids are fairly stable.  Her blood sugar is higher however.  Workload her on diet and exercise.

## 2014-04-05 NOTE — Patient Instructions (Signed)
Your physician recommends that you continue on your current medications as directed. Please refer to the Current Medication list given to you today.  Your physician wants you to follow-up in: 4 months with fasting labs (lp/bmet/hfp) You will receive a reminder letter in the mail two months in advance. If you don't receive a letter, please call our office to schedule the follow-up appointment.  

## 2014-04-05 NOTE — Progress Notes (Signed)
Kayla [name] Date of Birth:  07/17/1940 [facility] [address] [place], [place]  [id number] [phone]  Fax   [phone]  HPI: This pleasant 73 year old woman is seen for a scheduled four-month followup office visit. She has been feeling well since last visit. She has a history of ischemic heart disease and she had a non-Q-wave myocardial infarction in 2003. She did undergo cardiac catheterization but did not require percutaneous intervention. He has a history of hypercholesterolemia and is on low-dose Crestor. She has been a widow since June 2014 when her husband died of liver cancer. She is a member of the [employer] but has not been attending frequently.  Since last visit she has gone on a gluten-free diet and has lost 16 pounds intentionally.  Since last visit she has had some orthopedic problems with her right knee.  She improved after a course of prednisone.  She also has a Baker's cyst behind her right knee.   Current Outpatient Prescriptions  Medication Sig Dispense Refill  . aspirin 81 MG tablet Take 81 mg by mouth daily.      . Calcium-Vitamin D (POSTURE-D PO) Take 600 mg by mouth 2 (two) times daily.      . Cholecalciferol (VITAMIN D) 1000 UNITS capsule Take 1,000 Units by mouth daily.     . clopidogrel (PLAVIX) 75 MG tablet Take 1 tablet (75 mg total) by mouth daily. 30 tablet 3  . Coenzyme Q10 (COQ10) 100 MG CAPS Take 100 mg by mouth daily.     . Methylcellulose, Laxative, (CITRUCEL PO) Take by mouth daily.      . metoprolol (LOPRESSOR) 50 MG tablet TAKE (1/2) TABLET BY MOUTH TWICE DAILY. 30 tablet 6  . nitroGLYCERIN (NITROSTAT) 0.4 MG SL tablet Place 1 tablet (0.4 mg total) under the tongue every 5 (five) minutes as needed for chest pain. 25 tablet 11  . Omega-3 Fatty Acids (FISH OIL) 1000 MG CAPS Take by mouth daily. Taking 1200 daily    . rosuvastatin (CRESTOR) 5 MG tablet TAKE 1 TABLET BY MOUTH DAILY. 30 tablet 6   No current facility-administered  medications for this visit.    Allergies  Allergen Reactions  . Codeine Nausea Only  . Lipitor [Atorvastatin Calcium]   . Simvastatin     Patient Active Problem List   Diagnosis Date Noted  . Benign hypertensive heart disease without heart failure 03/01/2013  . Vaginal atrophy 05/03/2012  . Myocardial infarction   . Hyperlipidemia   . Ischemic heart disease   . 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 Breast cyst 02/27/2011  . Hyperkalemia 01/28/2011  . Old MI (myocardial infarction) 08/08/2010  . Hypercholesterolemia 08/08/2010  . Osteoporosis 08/08/2010    History  Smoking status  . Never Smoker   Smokeless tobacco  . Never Used    History  Alcohol Use No    Family History  Problem Relation Age of Onset  . Hypertension Mother   . Heart disease Mother   . Osteoporosis Mother   . Heart disease Father   . Colon cancer Father   . Breast cancer Maternal Grandmother     Age unknown  . Cancer Paternal Grandmother     Unknown type    Review of Systems: The patient denies any heat or cold intolerance.  No weight gain or weight loss.  The patient denies headaches or blurry vision.  There is no cough or sputum production.  The patient denies dizziness.  There is no  hematuria or hematochezia.  The patient denies any muscle aches or arthritis.  The patient denies any rash.  The patient denies frequent falling or instability.  There is no history of depression or anxiety.  All other systems were reviewed and are negative.   Physical Exam: Filed Vitals:   04/05/14 1034  BP: 136/78  Pulse: 64   general appearance well-developed well-nourished woman in no distress.The head and neck exam reveals pupils equal and reactive.  Extraocular movements are full.  There is no scleral icterus.  The mouth and pharynx are normal.  The neck is supple.  The carotids reveal no bruits.  The jugular venous pressure is normal.  The  thyroid is not enlarged.  There is no lymphadenopathy.  The chest  is clear to percussion and auscultation.  There are no rales or rhonchi.  Expansion of the chest is symmetrical.  The precordium is quiet.  The first heart sound is normal.  The second heart sound is physiologically split.  There is no murmur gallop rub or click.  There is no abnormal lift or heave.  The abdomen is soft and nontender.  The bowel sounds are normal.  The liver and spleen are not enlarged.  There are no abdominal masses.  There are no abdominal bruits.  Extremities reveal good pedal pulses.  There is no phlebitis or edema.  There is no cyanosis or clubbing.  Strength is normal and symmetrical in all extremities.  There is no lateralizing weakness.  There are no sensory deficits.  The skin is warm and dry.  There is no rash.  EKG today shows normal sinus rhythm and is within normal limits.    Assessment / Plan: 1.  Ischemic heart disease status post non-Q-wave myocardial infarction in 2003 2. Hypercholesterolemia  Plan: Continue current medication.  She has not been as good this time with her exercise or her weight.  Her weight is up 7 pounds.  She intends to get back into exercise after the holidays Return in 4 months for office visit  lipid panel hepatic function panel and basal metabolic panel.

## 2014-04-05 NOTE — Assessment & Plan Note (Signed)
No chest pain or shortness of breath. 

## 2014-04-17 ENCOUNTER — Encounter: Payer: Medicare Other | Admitting: Gynecology

## 2014-04-19 ENCOUNTER — Encounter: Payer: Self-pay | Admitting: Gynecology

## 2014-04-19 ENCOUNTER — Ambulatory Visit (INDEPENDENT_AMBULATORY_CARE_PROVIDER_SITE_OTHER): Payer: Medicare Other | Admitting: Gynecology

## 2014-04-19 VITALS — BP 116/74 | Ht 68.0 in | Wt 162.0 lb

## 2014-04-19 DIAGNOSIS — M8580 Other specified disorders of bone density and structure, unspecified site: Secondary | ICD-10-CM

## 2014-04-19 DIAGNOSIS — N952 Postmenopausal atrophic vaginitis: Secondary | ICD-10-CM

## 2014-04-19 NOTE — Patient Instructions (Signed)
You may obtain a copy of any labs that were done today by logging onto MyChart as outlined in the instructions provided with your AVS (after visit summary). The office will not call with normal lab results but certainly if there are any significant abnormalities then we will contact you.   Health Maintenance, Female A healthy lifestyle and preventative care can promote health and wellness.  Maintain regular health, dental, and eye exams.  Eat a healthy diet. Foods like vegetables, fruits, whole grains, low-fat dairy products, and lean protein foods contain the nutrients you need without too many calories. Decrease your intake of foods high in solid fats, added sugars, and salt. Get information about a proper diet from your caregiver, if necessary.  Regular physical exercise is one of the most important things you can do for your health. Most adults should get at least 150 minutes of moderate-intensity exercise (any activity that increases your heart rate and causes you to sweat) each week. In addition, most adults need muscle-strengthening exercises on 2 or more days a week.   Maintain a healthy weight. The body mass index (BMI) is a screening tool to identify possible weight problems. It provides an estimate of body fat based on height and weight. Your caregiver can help determine your BMI, and can help you achieve or maintain a healthy weight. For adults 20 years and older:  A BMI below 18.5 is considered underweight.  A BMI of 18.5 to 24.9 is normal.  A BMI of 25 to 29.9 is considered overweight.  A BMI of 30 and above is considered obese.  Maintain normal blood lipids and cholesterol by exercising and minimizing your intake of saturated fat. Eat a balanced diet with plenty of fruits and vegetables. Blood tests for lipids and cholesterol should begin at age 61 and be repeated every 5 years. If your lipid or cholesterol levels are high, you are over 50, or you are a high risk for heart  disease, you may need your cholesterol levels checked more frequently.Ongoing high lipid and cholesterol levels should be treated with medicines if diet and exercise are not effective.  If you smoke, find out from your caregiver how to quit. If you do not use tobacco, do not start.  Lung cancer screening is recommended for adults aged 33 80 years who are at high risk for developing lung cancer because of a history of smoking. Yearly low-dose computed tomography (CT) is recommended for people who have at least a 30-pack-year history of smoking and are a current smoker or have quit within the past 15 years. A pack year of smoking is smoking an average of 1 pack of cigarettes a day for 1 year (for example: 1 pack a day for 30 years or 2 packs a day for 15 years). Yearly screening should continue until the smoker has stopped smoking for at least 15 years. Yearly screening should also be stopped for people who develop a health problem that would prevent them from having lung cancer treatment.  If you are pregnant, do not drink alcohol. If you are breastfeeding, be very cautious about drinking alcohol. If you are not pregnant and choose to drink alcohol, do not exceed 1 drink per day. One drink is considered to be 12 ounces (355 mL) of beer, 5 ounces (148 mL) of wine, or 1.5 ounces (44 mL) of liquor.  Avoid use of street drugs. Do not share needles with anyone. Ask for help if you need support or instructions about stopping  the use of drugs.  High blood pressure causes heart disease and increases the risk of stroke. Blood pressure should be checked at least every 1 to 2 years. Ongoing high blood pressure should be treated with medicines, if weight loss and exercise are not effective.  If you are 59 to 75 years old, ask your caregiver if you should take aspirin to prevent strokes.  Diabetes screening involves taking a blood sample to check your fasting blood sugar level. This should be done once every 3  years, after age 91, if you are within normal weight and without risk factors for diabetes. Testing should be considered at a younger age or be carried out more frequently if you are overweight and have at least 1 risk factor for diabetes.  Breast cancer screening is essential preventative care for women. You should practice "breast self-awareness." This means understanding the normal appearance and feel of your breasts and may include breast self-examination. Any changes detected, no matter how small, should be reported to a caregiver. Women in their 66s and 30s should have a clinical breast exam (CBE) by a caregiver as part of a regular health exam every 1 to 3 years. After age 101, women should have a CBE every year. Starting at age 100, women should consider having a mammogram (breast X-ray) every year. Women who have a family history of breast cancer should talk to their caregiver about genetic screening. Women at a high risk of breast cancer should talk to their caregiver about having an MRI and a mammogram every year.  Breast cancer gene (BRCA)-related cancer risk assessment is recommended for women who have family members with BRCA-related cancers. BRCA-related cancers include breast, ovarian, tubal, and peritoneal cancers. Having family members with these cancers may be associated with an increased risk for harmful changes (mutations) in the breast cancer genes BRCA1 and BRCA2. Results of the assessment will determine the need for genetic counseling and BRCA1 and BRCA2 testing.  The Pap test is a screening test for cervical cancer. Women should have a Pap test starting at age 57. Between ages 25 and 35, Pap tests should be repeated every 2 years. Beginning at age 37, you should have a Pap test every 3 years as long as the past 3 Pap tests have been normal. If you had a hysterectomy for a problem that was not cancer or a condition that could lead to cancer, then you no longer need Pap tests. If you are  between ages 50 and 76, and you have had normal Pap tests going back 10 years, you no longer need Pap tests. If you have had past treatment for cervical cancer or a condition that could lead to cancer, you need Pap tests and screening for cancer for at least 20 years after your treatment. If Pap tests have been discontinued, risk factors (such as a new sexual partner) need to be reassessed to determine if screening should be resumed. Some women have medical problems that increase the chance of getting cervical cancer. In these cases, your caregiver may recommend more frequent screening and Pap tests.  The human papillomavirus (HPV) test is an additional test that may be used for cervical cancer screening. The HPV test looks for the virus that can cause the cell changes on the cervix. The cells collected during the Pap test can be tested for HPV. The HPV test could be used to screen women aged 44 years and older, and should be used in women of any age  who have unclear Pap test results. After the age of 55, women should have HPV testing at the same frequency as a Pap test.  Colorectal cancer can be detected and often prevented. Most routine colorectal cancer screening begins at the age of 44 and continues through age 20. However, your caregiver may recommend screening at an earlier age if you have risk factors for colon cancer. On a yearly basis, your caregiver may provide home test kits to check for hidden blood in the stool. Use of a small camera at the end of a tube, to directly examine the colon (sigmoidoscopy or colonoscopy), can detect the earliest forms of colorectal cancer. Talk to your caregiver about this at age 86, when routine screening begins. Direct examination of the colon should be repeated every 5 to 10 years through age 13, unless early forms of pre-cancerous polyps or small growths are found.  Hepatitis C blood testing is recommended for all people born from [birth year] through 1965 and any  individual with known risks for hepatitis C.  Practice safe sex. Use condoms and avoid high-risk sexual practices to reduce the spread of sexually transmitted infections (STIs). Sexually active women aged 36 and younger should be checked for Chlamydia, which is a common sexually transmitted infection. Older women with new or multiple partners should also be tested for Chlamydia. Testing for other STIs is recommended if you are sexually active and at increased risk.  Osteoporosis is a disease in which the bones lose minerals and strength with aging. This can result in serious bone fractures. The risk of osteoporosis can be identified using a bone density scan. Women ages 20 and over and women at risk for fractures or osteoporosis should discuss screening with their caregivers. Ask your caregiver whether you should be taking a calcium supplement or vitamin D to reduce the rate of osteoporosis.  Menopause can be associated with physical symptoms and risks. Hormone replacement therapy is available to decrease symptoms and risks. You should talk to your caregiver about whether hormone replacement therapy is right for you.  Use sunscreen. Apply sunscreen liberally and repeatedly throughout the day. You should seek shade when your shadow is shorter than you. Protect yourself by wearing long sleeves, pants, a wide-brimmed hat, and sunglasses year round, whenever you are outdoors.  Notify your caregiver of new moles or changes in moles, especially if there is a change in shape or color. Also notify your caregiver if a mole is larger than the size of a pencil eraser.  Stay current with your immunizations. Document Released: 10/28/2010 Document Revised: 08/09/2012 Document Reviewed: 10/28/2010 [facility] Patient Information 2014 [employer].

## 2014-04-19 NOTE — Progress Notes (Signed)
Kayla [name] 04/04/1941 [id number]        73 y.o.  [name] for follow up exam. Several issues noted below.  Past medical history,surgical history, problem list, medications, allergies, family history and social history were all reviewed and documented as reviewed in the EPIC chart.  ROS:  Performed with pertinent positives and negatives included in the history, assessment and plan.   Additional significant findings :  none   Exam: [name] personassistant Filed Vitals:   04/19/14 1003  BP: 116/74  Height: 5\' 8"  (1.727 m)  Weight: 162 lb (73.483 kg)   General appearance:  Normal affect, orientation and appearance. Skin: Grossly normal HEENT: Without gross lesions.  No cervical or supraclavicular adenopathy. Thyroid normal.  Lungs:  Clear without wheezing, rales or rhonchi Cardiac: RR, without RMG Abdominal:  Soft, nontender, without masses, guarding, rebound, organomegaly or hernia Breasts:  Examined lying and sitting without masses, retractions, discharge or axillary adenopathy. Pelvic:  Ext/BUS/vagina with generalized atrophic changes  Adnexa  Without masses or tenderness    Anus and perineum  Normal   Rectovaginal  Normal sphincter tone without palpated masses or tenderness.    Assessment/Plan:  73 y.o. [name] female for follow up exam.   1. Postmenopausal/atrophic genital changes. 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 Without significant hot flushes, night sweats, vaginal dryness. Is not sexually active. We'll continue to monitor. 2. Osteopenia. DEXA 03/2013 T score -1.3.  Had been on Fosamax for approximately 5 years per Dr. Verl DickerGottsegen's note. Currently on drug-free holiday. Increased calcium vitamin D reviewed. Continue to monitor with repeat DEXA in another year or 2. 3. Mammography 11/2013. Continue with annual mammography. SBE monthly reviewed. 4. Pap smear 2011. No Pap smear done today. Reviewed current screening guidelines. Patient status post hysterectomy  for benign indications and over the age of 75. Patient comfortable with stop screening. 5. Colonoscopy 2013. Repeat at their recommended interval. 6. Health maintenance. No routine blood work done as she has this done at her primary physician's office. Follow up in one year, sooner as needed.     Dara LordsFONTAINE,Soffia Doshier P MD, 10:21 AM 04/19/2014

## 2014-06-22 ENCOUNTER — Other Ambulatory Visit: Payer: Self-pay | Admitting: Cardiology

## 2014-06-28 ENCOUNTER — Other Ambulatory Visit: Payer: Self-pay | Admitting: Cardiology

## 2014-07-25 ENCOUNTER — Other Ambulatory Visit: Payer: Self-pay | Admitting: Cardiology

## 2014-07-31 ENCOUNTER — Other Ambulatory Visit: Payer: Self-pay | Admitting: Cardiology

## 2014-08-25 ENCOUNTER — Other Ambulatory Visit: Payer: Self-pay | Admitting: Cardiology

## 2014-09-11 ENCOUNTER — Other Ambulatory Visit (INDEPENDENT_AMBULATORY_CARE_PROVIDER_SITE_OTHER): Payer: Medicare Other | Admitting: *Deleted

## 2014-09-11 DIAGNOSIS — I119 Hypertensive heart disease without heart failure: Secondary | ICD-10-CM

## 2014-09-11 DIAGNOSIS — E785 Hyperlipidemia, unspecified: Secondary | ICD-10-CM | POA: Diagnosis not present

## 2014-09-11 LAB — BASIC METABOLIC PANEL
BUN: 17 mg/dL (ref 6–23)
CO2: 29 meq/L (ref 19–32)
CREATININE: 0.93 mg/dL (ref 0.40–1.20)
Calcium: 9.9 mg/dL (ref 8.4–10.5)
Chloride: 101 mEq/L (ref 96–112)
GFR: 62.38 mL/min (ref >60.00)
Glucose, Bld: 86 mg/dL (ref 70–99)
Potassium: 4.3 mEq/L (ref 3.5–5.1)
SODIUM: 136 meq/L (ref 135–145)

## 2014-09-11 LAB — HEPATIC FUNCTION PANEL
ALBUMIN: 3.8 g/dL (ref 3.5–5.2)
ALT: 16 U/L (ref 0–35)
AST: 28 U/L (ref 0–37)
Alkaline Phosphatase: 24 U/L — ABNORMAL LOW (ref 39–117)
Bilirubin, Direct: 0.1 mg/dL (ref 0.0–0.3)
Total Bilirubin: 0.7 mg/dL (ref 0.2–1.2)
Total Protein: 6.3 g/dL (ref 6.0–8.3)

## 2014-09-11 LAB — LIPID PANEL
CHOL/HDL RATIO: 3
Cholesterol: 155 mg/dL (ref 0–200)
HDL: 58.5 mg/dL (ref >39.00)
LDL Cholesterol: 85 mg/dL (ref 0–99)
NONHDL: 96.5
Triglycerides: 58 mg/dL (ref 0.0–149.0)
VLDL: 11.6 mg/dL (ref 0.0–40.0)

## 2014-09-11 NOTE — Progress Notes (Signed)
Quick Note:  Please make copy of labs for patient visit. ______ 

## 2014-09-11 NOTE — Addendum Note (Signed)
Addended by: Tonita PhoenixBOWDEN, Lelon Ikard K on: 09/11/2014 08:39 AM   Modules accepted: Orders

## 2014-09-15 ENCOUNTER — Encounter: Payer: Self-pay | Admitting: Cardiology

## 2014-09-15 ENCOUNTER — Ambulatory Visit (INDEPENDENT_AMBULATORY_CARE_PROVIDER_SITE_OTHER): Payer: Medicare Other | Admitting: Cardiology

## 2014-09-15 VITALS — BP 120/78 | HR 78 | Ht 69.0 in | Wt 160.8 lb

## 2014-09-15 DIAGNOSIS — I209 Angina pectoris, unspecified: Secondary | ICD-10-CM | POA: Diagnosis not present

## 2014-09-15 DIAGNOSIS — E785 Hyperlipidemia, unspecified: Secondary | ICD-10-CM

## 2014-09-15 MED ORDER — NITROGLYCERIN 0.4 MG SL SUBL: 0.4000 mg | SUBLINGUAL_TABLET | SUBLINGUAL | Status: DC | PRN

## 2014-09-15 NOTE — Patient Instructions (Signed)
Medication Instructions:  Your physician recommends that you continue on your current medications as directed. Please refer to the Current Medication list given to you today.  Labwork: NONE  Testing/Procedures: NONE  Follow-Up: Your physician wants you to follow-up in: 6 months with fasting labs (lp/bmet/hfp) AND EKG  You will receive a reminder letter in the mail two months in advance. If you don't receive a letter, please call our office to schedule the follow-up appointment.   Any Other Special Instructions Will Be Listed Below (If Applicable).  

## 2014-09-15 NOTE — Progress Notes (Signed)
Cardiology Office Note   Date:  09/15/2014   ID:  Kayla Herrvene J Rounsaville, DOB 12/05/1940, MRN [id number]  PCP:  Ralene OkMOREIRA,ROY, MD  Cardiologist: Cassell Clementhomas Nathaly Dawkins MD  No chief complaint on file.     History of Present Illness: Kayla [name] is a 74 y.o. female who presents for scheduled follow-up office visit.   Expand All Collapse All       Kayla [name] Date of [facility]: 02/26/1941 [facility] [address] [place], [place] [address] [phone] Fax [phone]  HPI: This pleasant 74 year old woman is seen for a scheduled four-month followup office visit. She has been feeling well since last visit. She has a history of ischemic heart disease and she had a non-Q-wave myocardial infarction in 2003. She did undergo cardiac catheterization but did not require percutaneous intervention. He has a history of hypercholesterolemia and is on low-dose Crestor. She has been a widow since June 2014 when her husband died of liver cancer. She is a member of the [employer] but has not been attending frequently. She has been going to the wife about twice a week.  She continues to have some trouble with her right knee.  She is able to use the exercise bike without difficulty however.  She denies chest pain or shortness of breath.  No dizziness or syncope.  Since last visit her weight is down 2 pounds intentionally.           Past Medical History  Diagnosis Date  . Ischemic heart disease   . Myocardial infarction 2003  . Hyperlipidemia   . Hypertension   . Osteopenia 03/2013    T score -1.3. FRAX not calculated    Past Surgical History  Procedure Laterality Date  . Breast surgery      Breast bx  . Abdominal hysterectomy  1984    TAH bleeding adenomyosis     Current Outpatient Prescriptions  Medication Sig Dispense Refill  . aspirin 81 MG tablet Take 81 mg by mouth daily.      . Calcium-Vitamin D (POSTURE-D PO) Take 600 mg by mouth 2 (two) times daily.      .  Cholecalciferol (VITAMIN D) 1000 UNITS capsule Take 1,000 Units by mouth daily.     . clopidogrel (PLAVIX) 75 MG tablet TAKE ONE TABLET BY MOUTH ONCE DAILY. 30 tablet 1  . Coenzyme Q10 (COQ10) 100 MG CAPS Take 100 mg by mouth daily.     . CRESTOR 5 MG tablet TAKE 1 TABLET BY MOUTH DAILY. 30 tablet 6  . Methylcellulose, Laxative, (CITRUCEL PO) Take 1 Dose by mouth daily.     . metoprolol (LOPRESSOR) 50 MG tablet TAKE (1/2) TABLET BY MOUTH TWICE DAILY. 30 tablet 4  . nitroGLYCERIN (NITROSTAT) 0.4 MG SL tablet Place 1 tablet (0.4 mg total) under the tongue every 5 (five) minutes as needed for chest pain. 25 tablet 11  . Omega-3 Fatty Acids (FISH OIL) 1200 MG CAPS Take 1,200 mg by mouth daily.    Marland Kitchen. Ubiquinol 100 MG CAPS Take 1 capsule by mouth daily.     No current facility-administered medications for this visit.    Allergies:   Codeine; Lipitor; and Simvastatin    Social History:  The patient  reports that she has never smoked. She has never used smokeless tobacco. She reports that she does not drink alcohol or use illicit drugs.   Family History:  The patient's family history includes Breast cancer in her maternal grandmother; Cancer in  her paternal grandmother; Colon cancer in her father; Heart disease in her father and mother; Hypertension in her mother; Osteoporosis in her mother.    ROS:  Please see the history of present illness.   Otherwise, review of systems are positive for none.   All other systems are reviewed and negative.    PHYSICAL EXAM: VS:  BP 120/78 mmHg  Pulse 78  Ht 5\' 9"  (1.753 m)  Wt 160 lb 12.8 oz (72.938 kg)  BMI 23.74 kg/m2 , BMI Body mass index is 23.74 kg/(m^2). GEN: Well nourished, well developed, in no acute distress HEENT: normal Neck: no JVD, carotid bruits, or masses Cardiac: RRR; no murmurs, rubs, or gallops,no edema  Respiratory:  clear to auscultation bilaterally, normal work of breathing GI: soft, nontender, nondistended, + BS MS: no deformity or  atrophy Skin: warm and dry, no rash Neuro:  Strength and sensation are intact Psych: euthymic mood, full affect   EKG:  EKG is not ordered today.    Recent Labs: 09/11/2014: ALT 16; BUN 17; Creatinine 0.93; Potassium 4.3; Sodium 136    Lipid Panel    Component Value Date/Time   CHOL 155 09/11/2014 0839   TRIG 58.0 09/11/2014 0839   HDL 58.50 09/11/2014 0839   CHOLHDL 3 09/11/2014 0839   VLDL 11.6 09/11/2014 0839   LDLCALC 85 09/11/2014 0839      Wt Readings from Last 3 Encounters:  09/15/14 160 lb 12.8 oz (72.938 kg)  04/19/14 162 lb (73.483 kg)  04/05/14 159 lb (72.122 kg)        ASSESSMENT AND PLAN:  1. Ischemic heart disease status post non-Q-wave myocardial infarction in 2003 2. Hypercholesterolemia  Plan: Continue current medication. Recheck in 6 months for follow-up office visit EKG and fasting lab work.  We refilled her sublingual nitroglycerin today.  She carries it but has not had to use it.   Current medicines are reviewed at length with the patient today.  The patient does not have concerns regarding medicines.  The following changes have been made:  no change  Labs/ tests ordered today include:   Orders Placed This Encounter  Procedures  . Lipid panel  . Hepatic function panel  . Basic metabolic panel     Signed, Cassell Clementhomas Abdi Husak MD 09/15/2014 5:46 PM    [facility] [address], [place], [place]  [address] Phone: [phone]; Fax: [phone]

## 2014-09-27 ENCOUNTER — Other Ambulatory Visit: Payer: Self-pay | Admitting: Cardiology

## 2014-10-24 ENCOUNTER — Other Ambulatory Visit: Payer: Self-pay | Admitting: Cardiology

## 2014-11-27 DIAGNOSIS — M8580 Other specified disorders of bone density and structure, unspecified site: Secondary | ICD-10-CM

## 2014-11-27 HISTORY — DX: Other specified disorders of bone density and structure, unspecified site: M85.80

## 2014-12-05 ENCOUNTER — Encounter: Payer: Self-pay | Admitting: Gynecology

## 2014-12-08 ENCOUNTER — Encounter: Payer: Self-pay | Admitting: Gynecology

## 2014-12-11 ENCOUNTER — Other Ambulatory Visit: Payer: Medicare Other

## 2015-03-12 ENCOUNTER — Other Ambulatory Visit (INDEPENDENT_AMBULATORY_CARE_PROVIDER_SITE_OTHER): Payer: Medicare Other | Admitting: *Deleted

## 2015-03-12 DIAGNOSIS — E785 Hyperlipidemia, unspecified: Secondary | ICD-10-CM | POA: Diagnosis not present

## 2015-03-12 LAB — BASIC METABOLIC PANEL
BUN: 24 mg/dL (ref 7–25)
CHLORIDE: 105 mmol/L (ref 98–110)
CO2: 26 mmol/L (ref 20–31)
Calcium: 9.3 mg/dL (ref 8.6–10.4)
Creat: 0.95 mg/dL — ABNORMAL HIGH (ref 0.60–0.93)
GLUCOSE: 95 mg/dL (ref 65–99)
POTASSIUM: 4.3 mmol/L (ref 3.5–5.3)
Sodium: 139 mmol/L (ref 135–146)

## 2015-03-12 LAB — LIPID PANEL
CHOL/HDL RATIO: 2.2 ratio (ref <=5.0)
Cholesterol: 169 mg/dL (ref 125–200)
HDL: 77 mg/dL (ref >=46)
LDL CALC: 82 mg/dL (ref <130)
Triglycerides: 49 mg/dL (ref <150)
VLDL: 10 mg/dL (ref <30)

## 2015-03-12 LAB — HEPATIC FUNCTION PANEL
ALT: 16 U/L (ref 6–29)
AST: 26 U/L (ref 10–35)
Albumin: 3.8 g/dL (ref 3.6–5.1)
Alkaline Phosphatase: 28 U/L — ABNORMAL LOW (ref 33–130)
BILIRUBIN DIRECT: 0.1 mg/dL (ref <=0.2)
BILIRUBIN TOTAL: 0.7 mg/dL (ref 0.2–1.2)
Indirect Bilirubin: 0.6 mg/dL (ref 0.2–1.2)
Total Protein: 6.2 g/dL (ref 6.1–8.1)

## 2015-03-13 NOTE — Progress Notes (Signed)
Quick Note:  Please make copy of labs for patient visit. ______ 

## 2015-03-14 ENCOUNTER — Encounter: Payer: Self-pay | Admitting: Cardiology

## 2015-03-14 ENCOUNTER — Ambulatory Visit (INDEPENDENT_AMBULATORY_CARE_PROVIDER_SITE_OTHER): Payer: Medicare Other | Admitting: Cardiology

## 2015-03-14 VITALS — BP 120/60 | HR 59 | Ht 68.0 in | Wt 161.8 lb

## 2015-03-14 DIAGNOSIS — E7800 Pure hypercholesterolemia, unspecified: Secondary | ICD-10-CM

## 2015-03-14 DIAGNOSIS — I119 Hypertensive heart disease without heart failure: Secondary | ICD-10-CM | POA: Diagnosis not present

## 2015-03-14 DIAGNOSIS — I2510 Atherosclerotic heart disease of native coronary artery without angina pectoris: Secondary | ICD-10-CM | POA: Diagnosis not present

## 2015-03-14 DIAGNOSIS — I2583 Coronary atherosclerosis due to lipid rich plaque: Secondary | ICD-10-CM

## 2015-03-14 DIAGNOSIS — I259 Chronic ischemic heart disease, unspecified: Secondary | ICD-10-CM | POA: Diagnosis not present

## 2015-03-14 NOTE — Progress Notes (Signed)
Cardiology Office Note   Date:  03/14/2015   ID:  Leland Her, DOB February 08, 1941, MRN [id number]  PCP:  Ralene Ok, MD  Cardiologist: Cassell Clement MD  Chief Complaint  Patient presents with  . Hyperlipidemia      History of Present Illness: PERRIE RAGIN is a 74 y.o. female who presents for a six-month follow-up office visit  This pleasant 74 year old woman is seen for a scheduled  followup office visit. She has been feeling well since last visit. She has a history of ischemic heart disease and she had a non-Q-wave myocardial infarction in 2003. She did undergo cardiac catheterization but did not require percutaneous intervention. He has a history of hypercholesterolemia and is on low-dose Crestor. She has been a widow since 07/12/14when her husband died of liver cancer. She is a member of the [employer] but has not been attending frequently. She has been going to the wife about twice a week. She continues to have some trouble with her right knee. She is able to use the exercise bike without difficulty however. She denies chest pain or shortness of breath. No dizziness or syncope. Past Medical History  Diagnosis Date  . Ischemic heart disease   . Myocardial infarction (HCC) 2003  . Hyperlipidemia   . Hypertension   . Osteopenia 11/2014    T score -1.3. FRAX 10%/1.8% noting prior Fosamax use.    Past Surgical History  Procedure Laterality Date  . Breast surgery      Breast bx  . Abdominal hysterectomy  1984    TAH bleeding adenomyosis     Current Outpatient Prescriptions  Medication Sig Dispense Refill  . aspirin 81 MG tablet Take 81 mg by mouth daily.      . Calcium-Vitamin D (POSTURE-D PO) Take 600 mg by mouth 2 (two) times daily.      . Cholecalciferol (VITAMIN D) 1000 UNITS capsule Take 1,000 Units by mouth daily.     . clopidogrel (PLAVIX) 75 MG tablet TAKE ONE TABLET BY MOUTH ONCE DAILY. 30 tablet 6  . Coenzyme Q10 (COQ10) 100 MG CAPS Take 100 mg by mouth  daily.     . CRESTOR 5 MG tablet TAKE 1 TABLET BY MOUTH DAILY. 30 tablet 6  . Methylcellulose, Laxative, (CITRUCEL PO) Take 1 Dose by mouth daily.     . metoprolol (LOPRESSOR) 50 MG tablet TAKE (1/2) TABLET BY MOUTH TWICE DAILY. 30 tablet 5  . nitroGLYCERIN (NITROSTAT) 0.4 MG SL tablet Place 1 tablet (0.4 mg total) under the tongue every 5 (five) minutes as needed for chest pain. 25 tablet 11  . Omega-3 Fatty Acids (FISH OIL) 1200 MG CAPS Take 1,200 mg by mouth daily.     No current facility-administered medications for this visit.    Allergies:   Codeine; Lipitor; and Simvastatin    Social History:  The patient  reports that she has never smoked. She has never used smokeless tobacco. She reports that she does not drink alcohol or use illicit drugs.   Family History:  The patient's family history includes Breast cancer in her maternal grandmother; Cancer in her paternal grandmother; Colon cancer in her father; Heart disease in her father and mother; Hypertension in her mother; Osteoporosis in her mother.    ROS:  Please see the history of present illness.   Otherwise, review of systems are positive for none.   All other systems are reviewed and negative.    PHYSICAL EXAM: VS:  BP 120/60  mmHg  Pulse 59  Ht 5\' 8"  (1.727 m)  Wt 161 lb 12.8 oz (73.392 kg)  BMI 24.61 kg/m2 , BMI Body mass index is 24.61 kg/(m^2). GEN: Well nourished, well developed, in no acute distress HEENT: normal Neck: no JVD, carotid bruits, or masses Cardiac: RRR; no murmurs, rubs, or gallops,no edema  Respiratory:  clear to auscultation bilaterally, normal work of breathing GI: soft, nontender, nondistended, + BS MS: no deformity or atrophy Skin: warm and dry, no rash Neuro:  Strength and sensation are intact Psych: euthymic mood, full affect   EKG:  EKG is ordered today. The ekg ordered today demonstrates sinus bradycardia at 59 bpm.  Otherwise within normal limits.   Recent Labs: 03/12/2015: ALT 16;  BUN 24; Creat 0.95*; Potassium 4.3; Sodium 139    Lipid Panel    Component Value Date/Time   CHOL 169 03/12/2015 0840   TRIG 49 03/12/2015 0840   HDL 77 03/12/2015 0840   CHOLHDL 2.2 03/12/2015 0840   VLDL 10 03/12/2015 0840   LDLCALC 82 03/12/2015 0840      Wt Readings from Last 3 Encounters:  03/14/15 161 lb 12.8 oz (73.392 kg)  09/15/14 160 lb 12.8 oz (72.938 kg)  04/19/14 162 lb (73.483 kg)         ASSESSMENT AND PLAN: 1. Ischemic heart disease status post non-Q-wave myocardial infarction in 2003 2. Hypercholesterolemia  Current medicines are reviewed at length with the patient today.  The patient does not have concerns regarding medicines.  The following changes have been made:  no change  Labs/ tests ordered today include:   Orders Placed This Encounter  Procedures  . EKG 12-Lead     Disposition:  Continue on current medication.  Following my retirement she will follow-up with Dr. [place].  Office visit and fasting lipid panel hepatic function panel and basal metabolic panel in 6 months. Karie SchwalbeSigned, Zaylyn Bergdoll MD 03/14/2015 1:29 PM    [facility] [address], [place], [place]  [address] Phone: [phone]; Fax: [phone]

## 2015-03-14 NOTE — Patient Instructions (Signed)
Medication Instructions:  Your physician recommends that you continue on your current medications as directed. Please refer to the Current Medication list given to you today.  Labwork: none  Testing/Procedures: none  Follow-Up: Your physician wants you to follow-up in: 6 months with fasting labs (lp/bmet/hfp) with Dr Reggy EyeKoneswaran  You will receive a reminder letter in the mail two months in advance. If you don't receive a letter, please call our office to schedule the follow-up appointment.  If you need a refill on your cardiac medications before your next appointment, please call your pharmacy.

## 2015-03-26 ENCOUNTER — Other Ambulatory Visit: Payer: Self-pay | Admitting: Cardiology

## 2015-04-30 ENCOUNTER — Encounter: Payer: Self-pay | Admitting: Gynecology

## 2015-04-30 ENCOUNTER — Ambulatory Visit (INDEPENDENT_AMBULATORY_CARE_PROVIDER_SITE_OTHER): Payer: PPO | Admitting: Gynecology

## 2015-04-30 VITALS — BP 130/76 | Ht 67.75 in | Wt 166.0 lb

## 2015-04-30 DIAGNOSIS — Z01419 Encounter for gynecological examination (general) (routine) without abnormal findings: Secondary | ICD-10-CM | POA: Diagnosis not present

## 2015-04-30 DIAGNOSIS — R8279 Other abnormal findings on microbiological examination of urine: Secondary | ICD-10-CM | POA: Diagnosis not present

## 2015-04-30 DIAGNOSIS — N952 Postmenopausal atrophic vaginitis: Secondary | ICD-10-CM | POA: Diagnosis not present

## 2015-04-30 DIAGNOSIS — M8580 Other specified disorders of bone density and structure, unspecified site: Secondary | ICD-10-CM

## 2015-04-30 NOTE — Progress Notes (Signed)
Kayla [name] 06/15/1940 [id number]        75 y.o.  [name]  For breast and pelvic exam.  Past medical history,surgical history, problem list, medications, allergies, family history and social history were all reviewed and documented as reviewed in the EPIC chart.  ROS:  Performed with pertinent positives and negatives included in the history, assessment and plan.   Additional significant findings :  none   Exam: Charity fundraiserBlanca assistant Filed Vitals:   04/30/15 0924  BP: 130/76  Height: 5' 7.75" (1.721 m)  Weight: 166 lb (75.297 kg)   General appearance:  Normal affect, orientation and appearance. Skin: Grossly normal HEENT: Without gross lesions.  No cervical or supraclavicular adenopathy. Thyroid normal.  Lungs:  Clear without wheezing, rales or rhonchi Cardiac: RR, without RMG Abdominal:  Soft, nontender, without masses, guarding, rebound, organomegaly or hernia Breasts:  Examined lying and sitting without masses, retractions, discharge or axillary adenopathy. Pelvic:  Ext/BUS/vagina with atrophic changes  Adnexa  Without masses or tenderness    Anus and perineum  Normal   Rectovaginal  Normal sphincter tone without palpated masses or tenderness.    Assessment/Plan:  75 y.o. [name] female for breast and pelvic exam.   1. Postmenopausal/atrophic genital changes.  Status post TAH for DUB/adenomyosis. No significant hot flushes, night sweats, vaginal dryness. Continue to monitor and report any issues. 2. Osteopenia. DEXA 2016 T score -1.3. Stable from prior DEXA. History of approximately 5 year Fosamax use on drug-free holiday. Plan repeat DEXA in several years. Increased calcium and vitamin D. 3. Mammography 11/2014. Continue annual mammography when due. SBE monthly reviewed. 4. Pap smear 2011. No Pap smear done today. No history of significant abnormal Pap smears. Per current screening guidelines we both agree to stop screening based on age and hysterectomy history. 5. Colonoscopy  2013. Repeat at their recommended interval. 6. Health maintenance. No routine blood work done as this is done at her primary physician's office. Follow up 1 year, sooner as needed.   Dara LordsFONTAINE,Nell Schrack P MD, 9:45 AM 04/30/2015

## 2015-04-30 NOTE — Patient Instructions (Signed)

## 2015-05-02 LAB — URINALYSIS W MICROSCOPIC + REFLEX CULTURE
BACTERIA UA: NONE SEEN [HPF]
Bilirubin Urine: NEGATIVE
CRYSTALS: NONE SEEN [HPF]
Casts: NONE SEEN [LPF]
GLUCOSE, UA: NEGATIVE
Hgb urine dipstick: NEGATIVE
Ketones, ur: NEGATIVE
Nitrite: NEGATIVE
PROTEIN: NEGATIVE
RBC / HPF: NONE SEEN RBC/HPF (ref <=2)
Specific Gravity, Urine: 1.015 (ref 1.001–1.035)
Squamous Epithelial / LPF: NONE SEEN [HPF] (ref <=5)
YEAST: NONE SEEN [HPF]
pH: 6 (ref 5.0–8.0)

## 2015-05-03 LAB — URINE CULTURE
Colony Count: NO GROWTH
ORGANISM ID, BACTERIA: NO GROWTH

## 2015-05-10 DIAGNOSIS — E7800 Pure hypercholesterolemia, unspecified: Secondary | ICD-10-CM | POA: Diagnosis not present

## 2015-05-10 DIAGNOSIS — I2510 Atherosclerotic heart disease of native coronary artery without angina pectoris: Secondary | ICD-10-CM | POA: Diagnosis not present

## 2015-05-23 ENCOUNTER — Other Ambulatory Visit: Payer: Self-pay | Admitting: Cardiology

## 2015-05-29 ENCOUNTER — Other Ambulatory Visit: Payer: Self-pay | Admitting: Cardiology

## 2015-06-13 ENCOUNTER — Ambulatory Visit (INDEPENDENT_AMBULATORY_CARE_PROVIDER_SITE_OTHER): Payer: PPO | Admitting: Gynecology

## 2015-06-13 ENCOUNTER — Telehealth: Payer: Self-pay | Admitting: *Deleted

## 2015-06-13 ENCOUNTER — Encounter: Payer: Self-pay | Admitting: Gynecology

## 2015-06-13 VITALS — BP 120/76

## 2015-06-13 DIAGNOSIS — R102 Pelvic and perineal pain: Secondary | ICD-10-CM | POA: Diagnosis not present

## 2015-06-13 DIAGNOSIS — N898 Other specified noninflammatory disorders of vagina: Secondary | ICD-10-CM | POA: Diagnosis not present

## 2015-06-13 LAB — URINALYSIS W MICROSCOPIC + REFLEX CULTURE
Bilirubin Urine: NEGATIVE
CASTS: NONE SEEN [LPF]
CRYSTALS: NONE SEEN [HPF]
Glucose, UA: NEGATIVE
KETONES UR: NEGATIVE
NITRITE: NEGATIVE
PROTEIN: NEGATIVE
Specific Gravity, Urine: 1.01 (ref 1.001–1.035)
YEAST: NONE SEEN [HPF]
pH: 5 (ref 5.0–8.0)

## 2015-06-13 LAB — WET PREP FOR TRICH, YEAST, CLUE
Clue Cells Wet Prep HPF POC: NONE SEEN
TRICH WET PREP: NONE SEEN
YEAST WET PREP: NONE SEEN

## 2015-06-13 MED ORDER — FLUCONAZOLE 150 MG PO TABS: 150.0000 mg | ORAL_TABLET | Freq: Once | ORAL | Status: DC

## 2015-06-13 MED ORDER — CIPROFLOXACIN HCL 250 MG PO TABS: 250.0000 mg | ORAL_TABLET | Freq: Two times a day (BID) | ORAL | Status: DC

## 2015-06-13 NOTE — Patient Instructions (Signed)
Take the one Diflucan pill. Take the ciprofloxacin twice daily for 7 days. Follow up if your symptoms persist, worsen or recur. 

## 2015-06-13 NOTE — Telephone Encounter (Signed)
I called to discuss the appointment that she made with [place] regarding lower abdominal pain. The patient states that the burning sensation is inside the vagina.  She is having discomfort in the vaginal area as well. Feels like it may be swollen. No discharge or UTI symptoms. She does have some lower abdominal discomfort but the main problem is vaginal. She stated she had previously saw Dr Audie Box for this a few years back and was given cream and she got better. I advised to keep the appointment. KW CMA

## 2015-06-13 NOTE — Progress Notes (Signed)
Kayla [name] Apr 08, 1941 [id number]        75 y.o.  [name] Presents with several days of vaginal burning both around the opening and inside. Some mild suprapubic discomfort. Frequency urgency dysuria. No low back pain. No vaginal odor. No systemic symptoms such as fever or chills.  Past medical history,surgical history, problem list, medications, allergies, family history and social history were all reviewed and documented in the EPIC chart.  Directed ROS with pertinent positives and negatives documented in the history of present illness/assessment and plan.  Exam: Kennon Portela assistant Filed Vitals:   06/13/15 1555  BP: 120/76   General appearance:  Normal Abdomen soft nontender without masses guarding rebound Pelvic external BUS vagina with atrophic changes. Scant discharge noted.  Bimanual without masses or tenderness.  Assessment/Plan:  75 y.o. [id number] with above history and exam. Urinalysis suggests low-grade UTI with 6-10 WBC, few bacteria and 0-5 squamous cells. Will cover with ciprofloxacin 250 mg twice a day 7 days. Follow up with culture. We'll also cover with Diflucan 150 mg 1 dose for the vaginal irritation. Wet prep was negative but she was having similar symptoms several years ago and was treated successfully with Diflucan with a negative wet prep. Follow up if symptoms persist, worsen or recur.    Dara Lords MD, 4:20 PM 06/13/2015

## 2015-06-15 LAB — URINE CULTURE
COLONY COUNT: NO GROWTH
ORGANISM ID, BACTERIA: NO GROWTH

## 2015-07-30 DIAGNOSIS — K5730 Diverticulosis of large intestine without perforation or abscess without bleeding: Secondary | ICD-10-CM | POA: Diagnosis not present

## 2015-07-30 DIAGNOSIS — R1084 Generalized abdominal pain: Secondary | ICD-10-CM | POA: Diagnosis not present

## 2015-07-30 DIAGNOSIS — R112 Nausea with vomiting, unspecified: Secondary | ICD-10-CM | POA: Diagnosis not present

## 2015-07-31 ENCOUNTER — Other Ambulatory Visit: Payer: Self-pay | Admitting: Internal Medicine

## 2015-07-31 DIAGNOSIS — R112 Nausea with vomiting, unspecified: Secondary | ICD-10-CM

## 2015-07-31 DIAGNOSIS — R1084 Generalized abdominal pain: Secondary | ICD-10-CM

## 2015-08-07 ENCOUNTER — Ambulatory Visit
Admission: RE | Admit: 2015-08-07 | Discharge: 2015-08-07 | Disposition: A | Discharge status: Home / Self Care | Payer: PPO | Source: Ambulatory Visit | Attending: Internal Medicine | Admitting: Internal Medicine

## 2015-08-07 DIAGNOSIS — R112 Nausea with vomiting, unspecified: Secondary | ICD-10-CM

## 2015-08-07 DIAGNOSIS — R1084 Generalized abdominal pain: Secondary | ICD-10-CM | POA: Diagnosis not present

## 2015-08-07 MED ORDER — IOPAMIDOL (ISOVUE-300) INJECTION 61%
100.0000 mL | Freq: Once | INTRAVENOUS | Status: AC | PRN
  Administered 2015-08-07: 100 mL via INTRAVENOUS

## 2015-08-08 DIAGNOSIS — M25551 Pain in right hip: Secondary | ICD-10-CM | POA: Diagnosis not present

## 2015-09-18 ENCOUNTER — Encounter: Payer: Self-pay | Admitting: Cardiovascular Disease

## 2015-09-18 ENCOUNTER — Ambulatory Visit (INDEPENDENT_AMBULATORY_CARE_PROVIDER_SITE_OTHER): Payer: PPO | Admitting: Cardiovascular Disease

## 2015-09-18 VITALS — BP 114/76 | HR 63 | Ht 68.0 in | Wt 166.0 lb

## 2015-09-18 DIAGNOSIS — Z5181 Encounter for therapeutic drug level monitoring: Secondary | ICD-10-CM | POA: Diagnosis not present

## 2015-09-18 DIAGNOSIS — I2510 Atherosclerotic heart disease of native coronary artery without angina pectoris: Secondary | ICD-10-CM

## 2015-09-18 DIAGNOSIS — E7800 Pure hypercholesterolemia, unspecified: Secondary | ICD-10-CM | POA: Diagnosis not present

## 2015-09-18 DIAGNOSIS — I252 Old myocardial infarction: Secondary | ICD-10-CM

## 2015-09-18 NOTE — Patient Instructions (Signed)
Your physician wants you to follow-up in: 1 YEAR You will receive a reminder letter in the mail two months in advance. If you don't receive a letter, please call our office to schedule the follow-up appointment.   Your physician recommends that you continue on your current medications as directed. Please refer to the Current Medication list given to you today.    If you need a refill on your cardiac medications before your next appointment, please call your pharmacy.     Thank you for choosing [facility] !

## 2015-09-18 NOTE — Progress Notes (Signed)
Patient ID: Kayla [name], female   DOB: 12/17/1940, 75 y.o.   MRN: [id number]      SUBJECTIVE: The patient is a 75 year old woman who I am meeting for the first time. She is a former patient of Dr. Patty SermonsBrackbill. She reportedly has a history of a non-Q wave myocardial infarction dating back to 2003. She apparently underwent cardiac catheterization but did not require percutaneous coronary intervention. She has a history of hypercholesterolemia. She has been a widow since June 2014 when her husband died of liver cancer.  The patient denies any symptoms of chest pain, palpitations, shortness of breath, lightheadedness, dizziness, leg swelling, orthopnea, PND, and syncope.  I reviewed her coronary angiography report from 01/13/2002 which demonstrated a large first diagonal branch with a proximal 95% stenosis. 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  [facility] sometimes twice per week, sometimes three times per week, and then may go 2 or 3 weeks without exercising at all. She says this is due to laziness.   Review of Systems: As per "subjective", otherwise negative.  Allergies  Allergen Reactions  . Codeine Nausea Only  . Lipitor [Atorvastatin Calcium] Other (See Comments)    Muscle aches  . Simvastatin Other (See Comments)    Muscle aches    Current Outpatient Prescriptions  Medication Sig Dispense Refill  . aspirin 81 MG tablet Take 81 mg by mouth daily.      . Calcium-Vitamin D (POSTURE-D PO) Take 600 mg by mouth 2 (two) times daily.      . Cholecalciferol (VITAMIN D) 1000 UNITS capsule Take 1,000 Units by mouth daily.     . clopidogrel (PLAVIX) 75 MG tablet TAKE ONE TABLET BY MOUTH ONCE DAILY. 30 tablet 10  . Coenzyme Q10 (COQ10) 100 MG CAPS Take 100 mg by mouth daily.     . Methylcellulose, Laxative, (CITRUCEL PO) Take 1 Dose by mouth daily.     . metoprolol (LOPRESSOR) 50 MG tablet TAKE (1/2) TABLET BY MOUTH TWICE  DAILY. 30 tablet 8  . nitroGLYCERIN (NITROSTAT) 0.4 MG SL tablet Place 1 tablet (0.4 mg total) under the tongue every 5 (five) minutes as needed for chest pain. 25 tablet 11  . Omega-3 Fatty Acids (FISH OIL) 1200 MG CAPS Take 1,200 mg by mouth daily.    . rosuvastatin (CRESTOR) 5 MG tablet TAKE 1 TABLET BY MOUTH DAILY. 30 tablet 6   No current facility-administered medications for this visit.    Past Medical History  Diagnosis Date  . Ischemic heart disease   . Myocardial infarction (HCC) 2003  . Hyperlipidemia   . Hypertension   . Osteopenia 11/2014    T score -1.3. FRAX 10%/1.8% noting prior Fosamax use.    Past Surgical History  Procedure Laterality Date  . Breast surgery      Breast bx  . Abdominal hysterectomy  1984    TAH bleeding adenomyosis    Social History   Social History  . Marital Status: Widowed    Spouse Name: N/A  . Number of Children: N/A  . Years of Education: N/A   Occupational History  . Not on file.   Social History Main Topics  . Smoking status: Never Smoker   . Smokeless tobacco: Never Used  . Alcohol Use: No  . Drug Use: No  . Sexual Activity: No   Other Topics Concern  . Not on file   Social History Narrative  Filed Vitals:   09/18/15 1108  BP: 114/76  Pulse: 63  Height:  (1.727 m)  Weight: 166 lb (75.297 kg)  SpO2: 99%    PHYSICAL EXAM General: NAD HEENT: Normal. Neck: No JVD, no thyromegaly. Lungs: Clear to auscultation bilaterally with normal respiratory effort. CV: Nondisplaced PMI.  Regular rate and rhythm, normal S1/S2, no S3/S4, no murmur. No pretibial or periankle edema.  No carotid bruit.   Abdomen: Soft, nontender, no distention.  Neurologic: Alert and oriented.  Psych: Normal affect. Skin: Normal. Musculoskeletal: No gross deformities.    ECG: Most recent ECG reviewed.      ASSESSMENT AND PLAN: 1. CAD with h/o MI: Stable ischemic heart disease. Continue aspirin, Plavix, metoprolol, and statin  therapy. Had a lengthy discussion regarding both antiplatelet and statin therapy. Given that she has done so well all these years free of symptoms and without bleeding problems, I think it is reasonable to continue Plavix.  2. Hyperlipidemia: 03/12/15 total cholesterol 169, triglycerides 49, HDL 77, LDL 82. Continue current dose of statin therapy (Crestor 5 mg).  Dispo: fu 1 year.   Prentice Docker, M.D., F.A.C.C.

## 2015-11-08 DIAGNOSIS — E7800 Pure hypercholesterolemia, unspecified: Secondary | ICD-10-CM | POA: Diagnosis not present

## 2015-11-08 DIAGNOSIS — I2510 Atherosclerotic heart disease of native coronary artery without angina pectoris: Secondary | ICD-10-CM | POA: Diagnosis not present

## 2015-11-08 DIAGNOSIS — Z6825 Body mass index (BMI) 25.0-25.9, adult: Secondary | ICD-10-CM | POA: Diagnosis not present

## 2015-11-08 DIAGNOSIS — Z0000 Encounter for general adult medical examination without abnormal findings: Secondary | ICD-10-CM | POA: Diagnosis not present

## 2015-11-22 DIAGNOSIS — Z23 Encounter for immunization: Secondary | ICD-10-CM | POA: Diagnosis not present

## 2015-12-03 DIAGNOSIS — Z803 Family history of malignant neoplasm of breast: Secondary | ICD-10-CM | POA: Diagnosis not present

## 2015-12-03 DIAGNOSIS — Z1231 Encounter for screening mammogram for malignant neoplasm of breast: Secondary | ICD-10-CM | POA: Diagnosis not present

## 2015-12-27 ENCOUNTER — Ambulatory Visit (INDEPENDENT_AMBULATORY_CARE_PROVIDER_SITE_OTHER): Payer: PPO | Admitting: Gynecology

## 2015-12-27 ENCOUNTER — Encounter: Payer: Self-pay | Admitting: Gynecology

## 2015-12-27 VITALS — BP 122/76

## 2015-12-27 DIAGNOSIS — R102 Pelvic and perineal pain: Secondary | ICD-10-CM | POA: Diagnosis not present

## 2015-12-27 DIAGNOSIS — N760 Acute vaginitis: Secondary | ICD-10-CM | POA: Diagnosis not present

## 2015-12-27 LAB — URINALYSIS W MICROSCOPIC + REFLEX CULTURE
BILIRUBIN URINE: NEGATIVE
CRYSTALS: NONE SEEN [HPF]
Glucose, UA: NEGATIVE
Hgb urine dipstick: NEGATIVE
NITRITE: NEGATIVE
RBC / HPF: NONE SEEN RBC/HPF (ref <=2)
SPECIFIC GRAVITY, URINE: 1.02 (ref 1.001–1.035)
Yeast: NONE SEEN [HPF]
pH: 5 (ref 5.0–8.0)

## 2015-12-27 LAB — WET PREP FOR TRICH, YEAST, CLUE
Clue Cells Wet Prep HPF POC: NONE SEEN
TRICH WET PREP: NONE SEEN

## 2015-12-27 MED ORDER — FLUCONAZOLE 150 MG PO TABS: 150.0000 mg | ORAL_TABLET | Freq: Every day | ORAL | 0 refills | Status: DC

## 2015-12-27 NOTE — Patient Instructions (Addendum)
Take the Diflucan pill daily for 3 days call me if your symptoms persist, worsen or recur Do not take your cholesterol medicine the day you take the Diflucan pills

## 2015-12-27 NOTE — Addendum Note (Signed)
Addended by: Dayna BarkerGARDNER, Dorn Hartshorne K on: 12/27/2015 02:29 PM   Modules accepted: Orders

## 2015-12-27 NOTE — Progress Notes (Signed)
    Kayla [name] 03/10/1941 [id number]        [age] y.o.  [name] presents with one-week of vulvar burning throbbing and discomfort. No vaginal discharge. No urinary symptoms such as frequency dysuria or urgency low back pain fever or chills. No nausea vomiting diarrhea constipation. Was treated for UTI and yeast vaginitis 06/13/2015 with complete resolution of her symptoms.  Past medical history,surgical history, problem list, medications, allergies, family history and social history were all reviewed and documented in the EPIC chart.  Directed ROS with pertinent positives and negatives documented in the history of present illness/assessment and plan.  Exam: Kennon PortelaKim Gardner assistant Vitals:   12/27/15 1003  BP: 122/76   General appearance:  Normal Spine straight without CVA tenderness Abdomen soft nontender without masses guarding rebound Pelvic external BUS vagina normal with atrophic changes. Scant discharge noted.  Bimanual exam without masses or tenderness.  Assessment/Plan:  75 y.o. [name] with above history and exam. Wet prep is positive for yeast. Urinalysis appears contaminated all it does show some bacteria. Will await culture results and treat if positive. We will treat the yeast vaginitis with Diflucan 150 mg daily for 3 days. Hopefully this will eradicate any reserve and recurrence. The need to hold her cholesterol medicine while taking the Diflucan was discussed. Follow up if symptoms persist, worsen or recur.    Dara LordsFONTAINE,Kayla Milledge P MD, 10:28 AM 12/27/2015

## 2015-12-28 LAB — URINE CULTURE

## 2016-01-02 ENCOUNTER — Telehealth: Payer: Self-pay

## 2016-01-02 MED ORDER — CIPROFLOXACIN HCL 250 MG PO TABS: 250.0000 mg | ORAL_TABLET | Freq: Two times a day (BID) | ORAL | 0 refills | Status: DC

## 2016-01-02 NOTE — Telephone Encounter (Signed)
Patient called stating you told her to call and report this week if symptoms not improved. She said they are not any better.  She mentioned that the prescription you gave her time before this visit "worked great". I looked back and that was a Diflucan Rx as well but she was also treated for uti with Cipro.

## 2016-01-02 NOTE — Telephone Encounter (Signed)
Okay for Diflucan 150 mg daily 3 days. Hold her cholesterol medicine the day she takes the Diflucan.

## 2016-01-02 NOTE — Telephone Encounter (Signed)
Patient informed. Rx sent 

## 2016-01-02 NOTE — Telephone Encounter (Signed)
I am concerned my note may have been confusing. What you treated her with on 12/27/15 was Diflucan for 3 days. That is what she is saying has not helped her.  She said what she had last time worked really well but it was the same treatment except with the cipro.  Just wanted to clarify and make sure you wanted her to repeat the Diflucan?

## 2016-01-02 NOTE — Telephone Encounter (Signed)
We can try a short course of ciprofloxacin 250 mg twice a day 3 days. Hold on the Diflucan. Office visit if symptoms persist

## 2016-02-25 ENCOUNTER — Other Ambulatory Visit: Payer: Self-pay | Admitting: Cardiovascular Disease

## 2016-02-26 ENCOUNTER — Other Ambulatory Visit: Payer: Self-pay | Admitting: Cardiovascular Disease

## 2016-02-29 DIAGNOSIS — Z23 Encounter for immunization: Secondary | ICD-10-CM | POA: Diagnosis not present

## 2016-03-10 DIAGNOSIS — H5203 Hypermetropia, bilateral: Secondary | ICD-10-CM | POA: Diagnosis not present

## 2016-03-10 DIAGNOSIS — H25013 Cortical age-related cataract, bilateral: Secondary | ICD-10-CM | POA: Diagnosis not present

## 2016-03-10 DIAGNOSIS — H52223 Regular astigmatism, bilateral: Secondary | ICD-10-CM | POA: Diagnosis not present

## 2016-03-10 DIAGNOSIS — H16143 Punctate keratitis, bilateral: Secondary | ICD-10-CM | POA: Diagnosis not present

## 2016-03-10 DIAGNOSIS — H2513 Age-related nuclear cataract, bilateral: Secondary | ICD-10-CM | POA: Diagnosis not present

## 2016-03-10 DIAGNOSIS — H524 Presbyopia: Secondary | ICD-10-CM | POA: Diagnosis not present

## 2016-03-24 DIAGNOSIS — J310 Chronic rhinitis: Secondary | ICD-10-CM | POA: Diagnosis not present

## 2016-03-24 DIAGNOSIS — H6993 Unspecified Eustachian tube disorder, bilateral: Secondary | ICD-10-CM | POA: Diagnosis not present

## 2016-03-24 DIAGNOSIS — J018 Other acute sinusitis: Secondary | ICD-10-CM | POA: Diagnosis not present

## 2016-03-26 DIAGNOSIS — M25512 Pain in left shoulder: Secondary | ICD-10-CM | POA: Diagnosis not present

## 2016-04-30 ENCOUNTER — Ambulatory Visit (INDEPENDENT_AMBULATORY_CARE_PROVIDER_SITE_OTHER): Payer: PPO | Admitting: Gynecology

## 2016-04-30 ENCOUNTER — Encounter: Payer: Self-pay | Admitting: Gynecology

## 2016-04-30 VITALS — BP 130/76 | Ht 68.0 in | Wt 174.0 lb

## 2016-04-30 DIAGNOSIS — N952 Postmenopausal atrophic vaginitis: Secondary | ICD-10-CM

## 2016-04-30 DIAGNOSIS — M899 Disorder of bone, unspecified: Secondary | ICD-10-CM

## 2016-04-30 DIAGNOSIS — Z01411 Encounter for gynecological examination (general) (routine) with abnormal findings: Secondary | ICD-10-CM | POA: Diagnosis not present

## 2016-04-30 NOTE — Progress Notes (Signed)
    Kayla [name] J Starling 01/29/1941 [id number]        76 y.o.  [name] for breast and pelvic exam  Past medical history,surgical history, problem list, medications, allergies, family history and social history were all reviewed and documented as reviewed in the EPIC chart.  ROS:  Performed with pertinent positives and negatives included in the history, assessment and plan.   Additional significant findings :  None   Exam: Kennon PortelaKim Gardner assistant Vitals:   04/30/16 1505  BP: 130/76  Weight: 174 lb (78.9 kg)  Height: 5\' 8"  (1.727 m)   Body mass index is 26.46 kg/m.  General appearance:  Normal affect, orientation and appearance. Skin: Grossly normal HEENT: Without gross lesions.  No cervical or supraclavicular adenopathy. Thyroid normal.  Lungs:  Clear without wheezing, rales or rhonchi Cardiac: RR, without RMG Abdominal:  Soft, nontender, without masses, guarding, rebound, organomegaly or hernia Breasts:  Examined lying and sitting without masses, retractions, discharge or axillary adenopathy. Pelvic:  Ext, BUS, Vagina with atrophic changes  Adnexa without masses or tenderness    Anus and perineum normal   Rectovaginal normal sphincter tone without palpated masses or tenderness.    Assessment/Plan:  76 y.o. [name] female for breast and pelvic exam  1. Postmenopausal/atrophic genital changes. Status post TVH for DUB/adenomyosis. No significant hot flushes, night sweats, vaginal dryness 2. Osteopenia. DEXA 2016 T score -1.3. History of Fosamax use for approximately 5 years on drug-free holiday. Repeat DEXA this coming year the patient will schedule follow up for this at 2 year interval. Strong family history of osteoporosis in her mother. 3. Pap smear 2011. Pap smear done today. No history of significant abnormal Pap smears. Per current screening guidelines we both agree to stop screening based on age and hysterectomy history. 4. Mammography 11/2015. Continue with annual mammography  when due. SBE monthly reviewed. 5. Colonoscopy 2013. Repeat at their recommended interval. 6. Health maintenance. No routine lab work done as patient does this elsewhere. Follow up 1 year, sooner as needed.   Dara LordsFONTAINE,Mozella Rexrode P MD, 3:30 PM 04/30/2016

## 2016-04-30 NOTE — Patient Instructions (Signed)
Followup for bone density as scheduled. 

## 2016-05-01 ENCOUNTER — Other Ambulatory Visit: Payer: Self-pay | Admitting: Cardiovascular Disease

## 2016-05-08 ENCOUNTER — Encounter: Payer: Self-pay | Admitting: Gynecology

## 2016-05-08 ENCOUNTER — Ambulatory Visit (INDEPENDENT_AMBULATORY_CARE_PROVIDER_SITE_OTHER): Payer: PPO

## 2016-05-08 DIAGNOSIS — M8588 Other specified disorders of bone density and structure, other site: Secondary | ICD-10-CM

## 2016-05-08 DIAGNOSIS — M899 Disorder of bone, unspecified: Secondary | ICD-10-CM

## 2016-05-08 DIAGNOSIS — M25512 Pain in left shoulder: Secondary | ICD-10-CM | POA: Diagnosis not present

## 2016-05-08 DIAGNOSIS — I2510 Atherosclerotic heart disease of native coronary artery without angina pectoris: Secondary | ICD-10-CM | POA: Diagnosis not present

## 2016-05-08 DIAGNOSIS — E7800 Pure hypercholesterolemia, unspecified: Secondary | ICD-10-CM | POA: Diagnosis not present

## 2016-05-29 ENCOUNTER — Other Ambulatory Visit: Payer: Self-pay | Admitting: Cardiovascular Disease

## 2016-07-31 DIAGNOSIS — J069 Acute upper respiratory infection, unspecified: Secondary | ICD-10-CM | POA: Diagnosis not present

## 2016-08-14 DIAGNOSIS — I2510 Atherosclerotic heart disease of native coronary artery without angina pectoris: Secondary | ICD-10-CM | POA: Diagnosis not present

## 2016-08-14 DIAGNOSIS — N181 Chronic kidney disease, stage 1: Secondary | ICD-10-CM | POA: Diagnosis not present

## 2016-08-14 DIAGNOSIS — E7800 Pure hypercholesterolemia, unspecified: Secondary | ICD-10-CM | POA: Diagnosis not present

## 2016-08-14 DIAGNOSIS — E559 Vitamin D deficiency, unspecified: Secondary | ICD-10-CM | POA: Diagnosis not present

## 2016-09-23 DIAGNOSIS — M542 Cervicalgia: Secondary | ICD-10-CM | POA: Diagnosis not present

## 2016-09-24 ENCOUNTER — Other Ambulatory Visit: Payer: Self-pay | Admitting: Cardiovascular Disease

## 2016-09-29 ENCOUNTER — Ambulatory Visit (INDEPENDENT_AMBULATORY_CARE_PROVIDER_SITE_OTHER): Payer: PPO | Admitting: Cardiovascular Disease

## 2016-09-29 ENCOUNTER — Encounter: Payer: Self-pay | Admitting: Cardiovascular Disease

## 2016-09-29 VITALS — BP 120/82 | HR 50 | Ht 68.0 in | Wt 174.0 lb

## 2016-09-29 DIAGNOSIS — E7800 Pure hypercholesterolemia, unspecified: Secondary | ICD-10-CM | POA: Diagnosis not present

## 2016-09-29 DIAGNOSIS — I2510 Atherosclerotic heart disease of native coronary artery without angina pectoris: Secondary | ICD-10-CM | POA: Diagnosis not present

## 2016-09-29 DIAGNOSIS — Z5181 Encounter for therapeutic drug level monitoring: Secondary | ICD-10-CM | POA: Diagnosis not present

## 2016-09-29 DIAGNOSIS — I252 Old myocardial infarction: Secondary | ICD-10-CM

## 2016-09-29 NOTE — Progress Notes (Signed)
SUBJECTIVE: The patient presents for routine follow-up of coronary artery disease. She reportedly has a history of a non-Q wave myocardial infarction dating back to 2003. She apparently underwent cardiac catheterization but did not require percutaneous coronary intervention. She has a history of hypercholesterolemia. She has been a widow since June 2014 when her husband died of liver cancer.  I reviewed her coronary angiography report from 01/13/2002 which demonstrated a large first diagonal branch with a proximal 95% stenosis. 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  I reviewed labs performed on 08/15/2016. Total cholesterol 154, triglycerides 68, HDL 62, LDL 78. Hemoglobin, platelets, and renal function were all normal.  ECG performed in the office today which I ordered and personally interpreted demonstrates normal sinus rhythm with no ischemic ST segment or T-wave abnormalities, nor any arrhythmias.  The patient denies any symptoms of chest pain, palpitations, shortness of breath, lightheadedness, dizziness, leg swelling, orthopnea, PND, and syncope.  [facility].   Review of Systems: As per "subjective", otherwise negative.  Allergies  Allergen Reactions  . Codeine Nausea Only  . Lipitor [Atorvastatin Calcium] Other (See Comments)    Muscle aches  . Simvastatin Other (See Comments)    Muscle aches    Current Outpatient Prescriptions  Medication Sig Dispense Refill  . aspirin 81 MG tablet Take 81 mg by mouth daily.      . Calcium-Vitamin D (POSTURE-D PO) Take 600 mg by mouth 2 (two) times daily.      . Cholecalciferol (VITAMIN D) 1000 UNITS capsule Take 1,000 Units by mouth daily.     . clopidogrel (PLAVIX) 75 MG tablet TAKE ONE TABLET BY MOUTH ONCE DAILY. 30 tablet 6  . Coenzyme Q10 (COQ10) 100 MG CAPS Take 100 mg by mouth daily.     . Methylcellulose, Laxative, (CITRUCEL PO) Take 1 Dose by  mouth daily.     . metoprolol tartrate (LOPRESSOR) 50 MG tablet TAKE (1/2) TABLET BY MOUTH TWICE DAILY. 30 tablet 6  . nitroGLYCERIN (NITROSTAT) 0.4 MG SL tablet Place 1 tablet (0.4 mg total) under the tongue every 5 (five) minutes as needed for chest pain. 25 tablet 11  . Omega-3 Fatty Acids (FISH OIL) 1200 MG CAPS Take 1,200 mg by mouth daily.    . rosuvastatin (CRESTOR) 5 MG tablet TAKE 1 TABLET BY MOUTH DAILY. 30 tablet 11   No current facility-administered medications for this visit.     Past Medical History:  Diagnosis Date  . Hyperlipidemia   . Hypertension   . Ischemic heart disease   . Myocardial infarction (HCC) 2003  . Osteopenia 04/2015   T score -1.3. Stable from prior DEXA    Past Surgical History:  Procedure Laterality Date  . ABDOMINAL HYSTERECTOMY  1984   TAH bleeding adenomyosis  . BREAST SURGERY     Breast bx    Social History   Social History  . Marital status: Widowed    Spouse name: N/A  . Number of children: N/A  . Years of education: N/A   Occupational History  . Not on file.   Social History Main Topics  . Smoking status: Never Smoker  . Smokeless tobacco: Never Used  . Alcohol use No  . Drug use: No  . Sexual activity: No     Comment: 1st intercourse 76 yo-Fewer than 5 partners   Other Topics Concern  . Not on file   Social  History Narrative  . No narrative on file     Vitals:   09/29/16 1141  BP: 120/82  Pulse: (!) 50  SpO2: 98%  Weight: 174 lb (78.9 kg)  Height: 5\' 8"  (1.727 m)    Wt Readings from Last 3 Encounters:  09/29/16 174 lb (78.9 kg)  04/30/16 174 lb (78.9 kg)  09/18/15 166 lb (75.3 kg)     PHYSICAL EXAM General: NAD HEENT: Normal. Neck: No JVD, no thyromegaly. Lungs: Clear to auscultation bilaterally with normal respiratory effort. CV: Nondisplaced PMI.  Bradycardic, regular rhythm, normal S1/S2, no S3/S4, no murmur. No pretibial or periankle edema.  No carotid bruit.   Abdomen: Soft, nontender, no  distention.  Neurologic: Alert and oriented.  Psych: Normal affect. Skin: Normal. Musculoskeletal: No gross deformities.    ECG: Most recent ECG reviewed.   Labs: Lab Results  Component Value Date/Time   K 4.3 03/12/2015 08:40 AM   BUN 24 03/12/2015 08:40 AM   CREATININE 0.95 (H) 03/12/2015 08:40 AM   ALT 16 03/12/2015 08:40 AM     Lipids: Lab Results  Component Value Date/Time   LDLCALC 82 03/12/2015 08:40 AM   CHOL 169 03/12/2015 08:40 AM   TRIG 49 03/12/2015 08:40 AM   HDL 77 03/12/2015 08:40 AM       ASSESSMENT AND PLAN: 1. CAD with h/o MI: Symptomatically stable. Continue aspirin, Plavix, metoprolol, and statin. We previously had a lengthy discussion regarding both antiplatelet and statin therapy. Given that she has done so well all these years free of symptoms and without bleeding problems, I think it is reasonable to continue Plavix.  2. Hyperlipidemia: Lipids reviewed above. Continue Crestor 5 mg daily.    Disposition: Follow up 1 yr  Prentice Docker, M.D., F.A.C.C.

## 2016-09-29 NOTE — Patient Instructions (Signed)
Your physician wants you to follow-up in: 1 year Dr Koneswaran You will receive a reminder letter in the mail two months in advance. If you don't receive a letter, please call our office to schedule the follow-up appointment.     Your physician recommends that you continue on your current medications as directed. Please refer to the Current Medication list given to you today.    If you need a refill on your cardiac medications before your next appointment, please call your pharmacy.      Thank you for choosing [facility] !         

## 2016-11-20 DIAGNOSIS — Z0000 Encounter for general adult medical examination without abnormal findings: Secondary | ICD-10-CM | POA: Diagnosis not present

## 2016-11-20 DIAGNOSIS — N183 Chronic kidney disease, stage 3 (moderate): Secondary | ICD-10-CM | POA: Diagnosis not present

## 2016-11-20 DIAGNOSIS — I2510 Atherosclerotic heart disease of native coronary artery without angina pectoris: Secondary | ICD-10-CM | POA: Diagnosis not present

## 2016-11-20 DIAGNOSIS — E7800 Pure hypercholesterolemia, unspecified: Secondary | ICD-10-CM | POA: Diagnosis not present

## 2016-11-20 DIAGNOSIS — Z6823 Body mass index (BMI) 23.0-23.9, adult: Secondary | ICD-10-CM | POA: Diagnosis not present

## 2016-12-04 DIAGNOSIS — Z803 Family history of malignant neoplasm of breast: Secondary | ICD-10-CM | POA: Diagnosis not present

## 2016-12-04 DIAGNOSIS — Z1231 Encounter for screening mammogram for malignant neoplasm of breast: Secondary | ICD-10-CM | POA: Diagnosis not present

## 2016-12-30 ENCOUNTER — Other Ambulatory Visit: Payer: Self-pay | Admitting: Cardiovascular Disease

## 2017-01-26 ENCOUNTER — Other Ambulatory Visit: Payer: Self-pay | Admitting: Cardiovascular Disease

## 2017-01-26 DIAGNOSIS — I209 Angina pectoris, unspecified: Secondary | ICD-10-CM

## 2017-01-26 MED ORDER — NITROGLYCERIN 0.4 MG SL SUBL: 0.4000 mg | SUBLINGUAL_TABLET | SUBLINGUAL | 3 refills | Status: DC | PRN

## 2017-01-26 NOTE — Telephone Encounter (Signed)
Pt lvm needing her nitroGLYCERIN (NITROSTAT) 0.4 MG SL tablet [[id number]]  Refilled--stated that [facility] has sent the fax

## 2017-02-04 DIAGNOSIS — Z23 Encounter for immunization: Secondary | ICD-10-CM | POA: Diagnosis not present

## 2017-02-19 DIAGNOSIS — I2510 Atherosclerotic heart disease of native coronary artery without angina pectoris: Secondary | ICD-10-CM | POA: Diagnosis not present

## 2017-02-19 DIAGNOSIS — N183 Chronic kidney disease, stage 3 (moderate): Secondary | ICD-10-CM | POA: Diagnosis not present

## 2017-02-19 DIAGNOSIS — E7800 Pure hypercholesterolemia, unspecified: Secondary | ICD-10-CM | POA: Diagnosis not present

## 2017-03-03 ENCOUNTER — Other Ambulatory Visit: Payer: Self-pay | Admitting: Cardiovascular Disease

## 2017-03-30 DIAGNOSIS — H2513 Age-related nuclear cataract, bilateral: Secondary | ICD-10-CM | POA: Diagnosis not present

## 2017-04-01 ENCOUNTER — Telehealth: Payer: Self-pay | Admitting: Cardiovascular Disease

## 2017-04-01 NOTE — Telephone Encounter (Signed)
Called pt. No answer. Left msg to call back.  

## 2017-04-01 NOTE — Telephone Encounter (Signed)
Pt called stating that she's having a heavy feeling in her chest, feels like she has lots of gas and she's having to take things to make herself burp, states it's running into her shoulders.   States no CP and no SOB--please give pt a call @ [phone]

## 2017-04-02 NOTE — Telephone Encounter (Signed)
Patient has had 3 days duration of chest tightness, feeling like she needed to burp, but couldn't. Thought it was indigestion, using Tums and Alka-seltzer with minimal relief. I had he take a NTG and pain went away. She will keep a log of how often she is needing to use NTG  now and understands to go to the ED if she needs more than 3 NTG

## 2017-04-02 NOTE — Telephone Encounter (Signed)
Returning call from yesterday. °

## 2017-04-03 NOTE — Telephone Encounter (Signed)
Have her follow up with me in the near future and I agree with your "symptom log" recommendation.

## 2017-04-03 NOTE — Telephone Encounter (Signed)
Next available apt 05/21/16 920 am [place] with Dr Purvis SheffieldKoneswaran, pt aware

## 2017-04-20 ENCOUNTER — Encounter: Payer: Self-pay | Admitting: Cardiovascular Disease

## 2017-04-20 ENCOUNTER — Ambulatory Visit: Payer: PPO | Admitting: Cardiovascular Disease

## 2017-04-20 ENCOUNTER — Encounter: Payer: Self-pay | Admitting: *Deleted

## 2017-04-20 VITALS — BP 122/78 | HR 67 | Ht 68.0 in | Wt 157.0 lb

## 2017-04-20 DIAGNOSIS — I252 Old myocardial infarction: Secondary | ICD-10-CM

## 2017-04-20 DIAGNOSIS — I25118 Atherosclerotic heart disease of native coronary artery with other forms of angina pectoris: Secondary | ICD-10-CM

## 2017-04-20 DIAGNOSIS — E7800 Pure hypercholesterolemia, unspecified: Secondary | ICD-10-CM | POA: Diagnosis not present

## 2017-04-20 DIAGNOSIS — I209 Angina pectoris, unspecified: Secondary | ICD-10-CM | POA: Diagnosis not present

## 2017-04-20 NOTE — Progress Notes (Signed)
SUBJECTIVE: The patient presents for routine follow-up.  She had called our office about 2-1/2 weeks ago complaining of 3 days of chest tightness with the sensation of needing to belch but could not.  She thought it was indigestion and tried Tums and Alka-Seltzer with minimal relief.  She took one nitroglycerin which resolved the pain.  She was instructed to keep a symptom log.  She reportedly has a history of a non-Q wave myocardial infarction dating back to 2003. She apparently underwent cardiac catheterization but did not require percutaneous coronary intervention. She has a history of hypercholesterolemia. She has been a widow since June 2014 when her husband died of liver cancer.  I reviewed her coronary angiography report from 01/13/2002 which demonstrated a large first diagonal branch with a proximal 95% stenosis. 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  ECG performed in the office today which I ordered and personally interpreted demonstrates normal sinus rhythm with no ischemic ST segment or T-wave abnormalities, nor any arrhythmias.  She has not had any further symptoms.  She does not like taking nitroglycerin because it causes a bad headache.  She denies any increasing fatigue since the symptoms occurred.  She seldom has palpitations.   Review of Systems: As per "subjective", otherwise negative.  Allergies  Allergen Reactions  . Codeine Nausea Only  . Lipitor [Atorvastatin Calcium] Other (See Comments)    Muscle aches  . Simvastatin Other (See Comments)    Muscle aches    Current Outpatient Medications  Medication Sig Dispense Refill  . aspirin 81 MG tablet Take 81 mg by mouth daily.      . Calcium-Vitamin D (POSTURE-D PO) Take 600 mg by mouth 2 (two) times daily.      . Cholecalciferol (VITAMIN D) 1000 UNITS capsule Take 1,000 Units by mouth daily.     . clopidogrel (PLAVIX) 75 MG tablet TAKE (1) TABLET BY MOUTH ONCE  DAILY. 90 tablet 3  . Coenzyme Q10 (COQ10) 100 MG CAPS Take 100 mg by mouth daily.     . Methylcellulose, Laxative, (CITRUCEL PO) Take 1 Dose by mouth daily.     . metoprolol tartrate (LOPRESSOR) 50 MG tablet TAKE (1/2) TABLET BY MOUTH TWICE DAILY. 30 tablet 6  . nitroGLYCERIN (NITROSTAT) 0.4 MG SL tablet Place 1 tablet (0.4 mg total) under the tongue every 5 (five) minutes as needed for chest pain. 25 tablet 3  . Omega-3 Fatty Acids (FISH OIL) 1200 MG CAPS Take 1,200 mg by mouth daily.    . rosuvastatin (CRESTOR) 5 MG tablet TAKE 1 TABLET BY MOUTH DAILY. 30 tablet 0   No current facility-administered medications for this visit.     Past Medical History:  Diagnosis Date  . Hyperlipidemia   . Hypertension   . Ischemic heart disease   . Myocardial infarction (HCC) 2003  . Osteopenia 04/2015   T score -1.3. Stable from prior DEXA    Past Surgical History:  Procedure Laterality Date  . ABDOMINAL HYSTERECTOMY  1984   TAH bleeding adenomyosis  . BREAST SURGERY     Breast bx    Social History   Socioeconomic History  . Marital status: Widowed    Spouse name: Not on file  . Number of children: Not on file  . Years of education: Not on file  . Highest education level: Not on file  Social Needs  . Financial resource strain: Not on file  .  Food insecurity - worry: Not on file  . Food insecurity - inability: Not on file  . Transportation needs - medical: Not on file  . Transportation needs - non-medical: Not on file  Occupational History  . Not on file  Tobacco Use  . Smoking status: Never Smoker  . Smokeless tobacco: Never Used  Substance and Sexual Activity  . Alcohol use: No    Alcohol/week: 0.0 oz  . Drug use: No  . Sexual activity: No    Birth control/protection: Surgical    Comment: 1st intercourse 76 yo-Fewer than 5 partners  Other Topics Concern  . Not on file  Social History Narrative  . Not on file     Vitals:   04/20/17 0908  BP: 122/78  Pulse: 67    SpO2: 98%  Weight: 157 lb (71.2 kg)  Height: 5\' 8"  (1.727 m)    Wt Readings from Last 3 Encounters:  04/20/17 157 lb (71.2 kg)  09/29/16 174 lb (78.9 kg)  04/30/16 174 lb (78.9 kg)     PHYSICAL EXAM General: NAD HEENT: Normal. Neck: No JVD, no thyromegaly. Lungs: Clear to auscultation bilaterally with normal respiratory effort. CV: Regular rate and rhythm, normal S1/S2, no S3/S4, no murmur. No pretibial or periankle edema.  No carotid bruit.   Abdomen: Soft, nontender, no distention.  Neurologic: Alert and oriented.  Psych: Normal affect. Skin: Normal. Musculoskeletal: No gross deformities.    ECG: Most recent ECG reviewed.   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  I will obtain an echocardiogram to evaluate cardiac structure and function and also obtain an exercise Myoview stress test to evaluate for ischemia. Continue aspirin, Plavix, metoprolol, and statin. We previously had a lengthy discussion regarding both antiplatelet and statin therapy. Given that she has done so well all these years free of symptoms and without bleeding problems, I think it is reasonable to continue Plavix.  2. Hyperlipidemia: Lipids previously reviewed. Continue Crestor 5 mg daily.     Disposition: Follow up 6 weeks   Prentice DockerSuresh Ivori Storr, M.D., F.A.C.C.

## 2017-04-20 NOTE — Addendum Note (Signed)
Addended by: Lesle ChrisHILL, Alyx Gee G on: 04/20/2017 09:41 AM   Modules accepted: Orders

## 2017-04-20 NOTE — Patient Instructions (Addendum)
Medication Instructions:  Continue all current medications.  Labwork: none  Testing/Procedures:  Your physician has requested that you have an echocardiogram. Echocardiography is a painless test that uses sound waves to create images of your heart. It provides your doctor with information about the size and shape of your heart and how well your heart's chambers and valves are working. This procedure takes approximately one hour. There are no restrictions for this procedure. Your physician has requested that you have en exercise stress myoview. For further information please visit [email or web address]. Please follow instruction sheet, as given.  Office will contact with results via phone or letter.    Follow-Up: 6 weeks - [place] office   Any Other Special Instructions Will Be Listed Below (If Applicable).  If you need a refill on your cardiac medications before your next appointment, please call your pharmacy.

## 2017-04-22 ENCOUNTER — Other Ambulatory Visit: Payer: Self-pay | Admitting: Cardiovascular Disease

## 2017-04-22 DIAGNOSIS — R079 Chest pain, unspecified: Secondary | ICD-10-CM

## 2017-04-22 DIAGNOSIS — I2510 Atherosclerotic heart disease of native coronary artery without angina pectoris: Secondary | ICD-10-CM

## 2017-04-22 DIAGNOSIS — I209 Angina pectoris, unspecified: Secondary | ICD-10-CM

## 2017-04-23 ENCOUNTER — Other Ambulatory Visit: Payer: Self-pay

## 2017-04-23 ENCOUNTER — Ambulatory Visit (INDEPENDENT_AMBULATORY_CARE_PROVIDER_SITE_OTHER): Payer: PPO

## 2017-04-23 DIAGNOSIS — I209 Angina pectoris, unspecified: Secondary | ICD-10-CM

## 2017-04-23 DIAGNOSIS — I2510 Atherosclerotic heart disease of native coronary artery without angina pectoris: Secondary | ICD-10-CM | POA: Diagnosis not present

## 2017-04-23 DIAGNOSIS — R079 Chest pain, unspecified: Secondary | ICD-10-CM | POA: Diagnosis not present

## 2017-04-24 ENCOUNTER — Telehealth: Payer: Self-pay | Admitting: *Deleted

## 2017-04-24 NOTE — Telephone Encounter (Signed)
-----   Message from Laqueta LindenSuresh A Koneswaran, MD sent at 04/23/2017  4:35 PM EST ----- Normal pumping function. Valve leakage noted.

## 2017-04-24 NOTE — Telephone Encounter (Signed)
Pt aware and voiced understanding - routed to pcp  

## 2017-04-27 ENCOUNTER — Encounter (HOSPITAL_COMMUNITY): Payer: Self-pay

## 2017-04-27 ENCOUNTER — Ambulatory Visit (HOSPITAL_COMMUNITY)
Admission: RE | Admit: 2017-04-27 | Discharge: 2017-04-27 | Disposition: A | Discharge status: Home / Self Care | Payer: PPO | Source: Ambulatory Visit | Attending: Cardiovascular Disease | Admitting: Cardiovascular Disease

## 2017-04-27 ENCOUNTER — Encounter (HOSPITAL_BASED_OUTPATIENT_CLINIC_OR_DEPARTMENT_OTHER)
Admission: RE | Admit: 2017-04-27 | Discharge: 2017-04-27 | Disposition: A | Discharge status: Home / Self Care | Payer: PPO | Source: Ambulatory Visit | Attending: Cardiovascular Disease | Admitting: Cardiovascular Disease

## 2017-04-27 DIAGNOSIS — I209 Angina pectoris, unspecified: Secondary | ICD-10-CM

## 2017-04-27 LAB — NM MYOCAR MULTI W/SPECT W/WALL MOTION / EF
CHL CUP MPHR: 142 {beats}/min
CHL CUP NUCLEAR SSS: 0
CHL RATE OF PERCEIVED EXERTION: 15
CSEPED: 4 min
CSEPPHR: 133 {beats}/min
Estimated workload: 4.6 METS
Exercise duration (sec): 0 s
LVDIAVOL: 64 mL (ref 46–106)
LVSYSVOL: 15 mL
NUC STRESS TID: 1.47
Percent HR: 93 %
RATE: 0.35
Rest HR: 75 {beats}/min
SDS: 0
SRS: 0

## 2017-04-27 MED ORDER — REGADENOSON 0.4 MG/5ML IV SOLN
INTRAVENOUS | Status: AC
  Filled 2017-04-27: qty 5

## 2017-04-27 MED ORDER — TECHNETIUM TC 99M TETROFOSMIN IV KIT
10.0000 | PACK | Freq: Once | INTRAVENOUS | Status: AC | PRN
  Administered 2017-04-27: 10 via INTRAVENOUS

## 2017-04-27 MED ORDER — SODIUM CHLORIDE 0.9% FLUSH
INTRAVENOUS | Status: AC
  Administered 2017-04-27: 10 mL via INTRAVENOUS
  Filled 2017-04-27: qty 10

## 2017-04-27 MED ORDER — TECHNETIUM TC 99M TETROFOSMIN IV KIT
30.0000 | PACK | Freq: Once | INTRAVENOUS | Status: AC | PRN
  Administered 2017-04-27: 30 via INTRAVENOUS

## 2017-04-29 ENCOUNTER — Other Ambulatory Visit: Payer: Self-pay | Admitting: Cardiovascular Disease

## 2017-05-05 ENCOUNTER — Ambulatory Visit: Payer: PPO | Admitting: Gynecology

## 2017-05-05 ENCOUNTER — Encounter: Payer: Self-pay | Admitting: Gynecology

## 2017-05-05 VITALS — BP 118/76

## 2017-05-05 DIAGNOSIS — N952 Postmenopausal atrophic vaginitis: Secondary | ICD-10-CM

## 2017-05-05 DIAGNOSIS — N3000 Acute cystitis without hematuria: Secondary | ICD-10-CM

## 2017-05-05 DIAGNOSIS — N898 Other specified noninflammatory disorders of vagina: Secondary | ICD-10-CM | POA: Diagnosis not present

## 2017-05-05 LAB — WET PREP FOR TRICH, YEAST, CLUE

## 2017-05-05 MED ORDER — NITROFURANTOIN MONOHYD MACRO 100 MG PO CAPS: 100.0000 mg | ORAL_CAPSULE | Freq: Two times a day (BID) | ORAL | 0 refills | Status: DC

## 2017-05-05 MED ORDER — METRONIDAZOLE 0.75 % VA GEL: 1.0000 | Freq: Every day | VAGINAL | 0 refills | Status: DC

## 2017-05-05 NOTE — Progress Notes (Signed)
    Kayla [name] 07/09/1940 [id number]        77 y.o.  [name] presents with a one-week history of vulvar irritation and burning particularly after urination.  No discharge or odor.  History of same before had been treated with Diflucan with resolution of her symptoms.  No urinary symptoms such as frequency dysuria urgency low back pain fever or chills.  No nausea vomiting diarrhea constipation.  Past medical history,surgical history, problem list, medications, allergies, family history and social history were all reviewed and documented in the EPIC chart.  Directed ROS with pertinent positives and negatives documented in the history of present illness/assessment and plan.  Exam: Kennon PortelaKim Gardner assistant Vitals:   05/05/17 1209  BP: 118/76   General appearance:  Normal Spine straight without CVA tenderness Abdomen soft nontender without masses guarding rebound Pelvic external BUS vagina with atrophic changes.  Significant discharge.  Bimanual exam without masses or tenderness.  Assessment/Plan:  77 y.o. [name] with history and exam as above.  Urine analysis consistent with UTI showing 10-20 WBC, 0-5 epithelial, no significant RBC and moderate bacteriuria.  Wet prep shows clue cells but no yeast.  We will treat for UTI with Macrobid 100 mg twice daily times 7 days.  Will cover for bacterial vaginosis with MetroGel nightly x5-7 nights.  Follow-up if symptoms persist, worsen or recur.    Dara Lordsimothy P Johannes Everage MD, 12:16 PM 05/05/2017

## 2017-05-05 NOTE — Patient Instructions (Signed)
Take antibiotic twice daily for 7 days. Use vaginal cream nightly for 5-7 days

## 2017-05-07 LAB — URINALYSIS W MICROSCOPIC + REFLEX CULTURE
BILIRUBIN URINE: NEGATIVE
Glucose, UA: NEGATIVE
HYALINE CAST: NONE SEEN /LPF
Hgb urine dipstick: NEGATIVE
Ketones, ur: NEGATIVE
NITRITES URINE, INITIAL: NEGATIVE
PROTEIN: NEGATIVE
RBC / HPF: NONE SEEN /HPF (ref 0–2)
Specific Gravity, Urine: 1.007 (ref 1.001–1.03)
pH: 5.5 (ref 5.0–8.0)

## 2017-05-07 LAB — URINE CULTURE
MICRO NUMBER:: 90034261
Result:: NO GROWTH
SPECIMEN QUALITY:: ADEQUATE

## 2017-05-07 LAB — CULTURE INDICATED

## 2017-05-13 ENCOUNTER — Encounter: Payer: Self-pay | Admitting: Gynecology

## 2017-05-13 ENCOUNTER — Ambulatory Visit: Payer: PPO | Admitting: Gynecology

## 2017-05-13 VITALS — BP 118/74 | Ht 68.0 in | Wt 160.0 lb

## 2017-05-13 DIAGNOSIS — Z87440 Personal history of urinary (tract) infections: Secondary | ICD-10-CM

## 2017-05-13 DIAGNOSIS — N952 Postmenopausal atrophic vaginitis: Secondary | ICD-10-CM

## 2017-05-13 DIAGNOSIS — M8580 Other specified disorders of bone density and structure, unspecified site: Secondary | ICD-10-CM

## 2017-05-13 DIAGNOSIS — Z01411 Encounter for gynecological examination (general) (routine) with abnormal findings: Secondary | ICD-10-CM | POA: Diagnosis not present

## 2017-05-13 DIAGNOSIS — R10816 Epigastric abdominal tenderness: Secondary | ICD-10-CM | POA: Diagnosis not present

## 2017-05-13 NOTE — Progress Notes (Signed)
    Kayla [name] Kayla [name] 08/20/1940 [id number]        77 y.o.  [name] for breast and pelvic exam.  Recently treated for UTI and bacterial vaginosis feeling better after antibiotic treatment.  Past medical history,surgical history, problem list, medications, allergies, family history and social history were all reviewed and documented as reviewed in the EPIC chart.  ROS:  Performed with pertinent positives and negatives included in the history, assessment and plan.   Additional significant findings : None   Exam: Kayla [name] assistant Vitals:   05/13/17 1558  BP: 118/74  Weight: 160 lb (72.6 kg)  Height: 5\' 8"  (1.727 m)   Body mass index is 24.33 kg/m.  General appearance:  Normal affect, orientation and appearance. Skin: Grossly normal HEENT: Without gross lesions.  No cervical or supraclavicular adenopathy. Thyroid normal.  Lungs:  Clear without wheezing, rales or rhonchi Cardiac: RR, without RMG Abdominal:  Soft, nontender, without masses, guarding, rebound, organomegaly or hernia Breasts:  Examined lying and sitting without masses, retractions, discharge or axillary adenopathy. Pelvic:  Ext, BUS, Vagina: With atrophic changes  Adnexa: Without masses or tenderness    Anus and perineum: Normal   Rectovaginal: Normal sphincter tone without palpated masses or tenderness.    Assessment/Plan:  [age] y.o. [name] female for breast and pelvic exam, status post TVH for DUB/adenomyosis.   1. Postmenopausal/atrophic genital changes.  No significant hot flushes, night sweats or vaginal dryness. 2. History of recent treatment for UTI and vaginitis.  Check baseline UA now. 3. Osteopenia.  DEXA 04/2016 T score -1.3 stable from prior DEXA.  Plan repeat DEXA next year at 2-year interval. 4. Pap smear 2011.  No Pap smear done today.  No history of abnormal Pap smears.  Per current screening guidelines based on age and hysterectomy history we both agree to stop screening. 5. Colonoscopy 2013.   Repeat at their recommended interval. 6. Mammography 11/2016.  Continue with annual mammography when due.  SBE monthly reviewed. 7. Health maintenance.  No routine lab work done as patient does this elsewhere.  Follow-up 1 year, sooner as needed.   Dara Lordsimothy P Loreto Loescher MD, 4:24 PM 05/13/2017

## 2017-05-13 NOTE — Patient Instructions (Signed)
Follow-up in 1 year, sooner as needed. 

## 2017-05-14 LAB — URINALYSIS W MICROSCOPIC + REFLEX CULTURE
BACTERIA UA: NONE SEEN /HPF
Bilirubin Urine: NEGATIVE
Glucose, UA: NEGATIVE
HGB URINE DIPSTICK: NEGATIVE
HYALINE CAST: NONE SEEN /LPF
Ketones, ur: NEGATIVE
Leukocyte Esterase: NEGATIVE
Nitrites, Initial: NEGATIVE
Protein, ur: NEGATIVE
RBC / HPF: NONE SEEN /HPF (ref 0–2)
Specific Gravity, Urine: 1.004 (ref 1.001–1.03)
Squamous Epithelial / LPF: NONE SEEN /HPF (ref <=5)
WBC UA: NONE SEEN /HPF (ref 0–5)
pH: <=5 (ref 5.0–8.0)

## 2017-05-20 ENCOUNTER — Other Ambulatory Visit: Payer: Self-pay | Admitting: Internal Medicine

## 2017-05-20 DIAGNOSIS — R1084 Generalized abdominal pain: Secondary | ICD-10-CM | POA: Diagnosis not present

## 2017-05-20 DIAGNOSIS — R1011 Right upper quadrant pain: Secondary | ICD-10-CM | POA: Diagnosis not present

## 2017-05-26 ENCOUNTER — Ambulatory Visit
Admission: RE | Admit: 2017-05-26 | Discharge: 2017-05-26 | Disposition: A | Discharge status: Home / Self Care | Payer: PPO | Source: Ambulatory Visit | Attending: Internal Medicine | Admitting: Internal Medicine

## 2017-05-26 DIAGNOSIS — R1011 Right upper quadrant pain: Secondary | ICD-10-CM

## 2017-05-26 DIAGNOSIS — R1084 Generalized abdominal pain: Secondary | ICD-10-CM

## 2017-05-26 DIAGNOSIS — R109 Unspecified abdominal pain: Secondary | ICD-10-CM | POA: Diagnosis not present

## 2017-05-27 ENCOUNTER — Other Ambulatory Visit: Payer: Self-pay | Admitting: Cardiovascular Disease

## 2017-06-02 ENCOUNTER — Other Ambulatory Visit: Payer: Self-pay | Admitting: Cardiovascular Disease

## 2017-06-03 DIAGNOSIS — R198 Other specified symptoms and signs involving the digestive system and abdomen: Secondary | ICD-10-CM | POA: Diagnosis not present

## 2017-06-03 DIAGNOSIS — R1084 Generalized abdominal pain: Secondary | ICD-10-CM | POA: Diagnosis not present

## 2017-06-03 DIAGNOSIS — R140 Abdominal distension (gaseous): Secondary | ICD-10-CM | POA: Diagnosis not present

## 2017-06-03 DIAGNOSIS — R932 Abnormal findings on diagnostic imaging of liver and biliary tract: Secondary | ICD-10-CM | POA: Diagnosis not present

## 2017-06-08 ENCOUNTER — Encounter: Payer: Self-pay | Admitting: Cardiovascular Disease

## 2017-06-08 ENCOUNTER — Ambulatory Visit: Payer: PPO | Admitting: Cardiovascular Disease

## 2017-06-08 VITALS — BP 128/70 | HR 75 | Ht 68.0 in | Wt 161.0 lb

## 2017-06-08 DIAGNOSIS — I25118 Atherosclerotic heart disease of native coronary artery with other forms of angina pectoris: Secondary | ICD-10-CM | POA: Diagnosis not present

## 2017-06-08 DIAGNOSIS — I252 Old myocardial infarction: Secondary | ICD-10-CM

## 2017-06-08 DIAGNOSIS — I38 Endocarditis, valve unspecified: Secondary | ICD-10-CM

## 2017-06-08 DIAGNOSIS — E7800 Pure hypercholesterolemia, unspecified: Secondary | ICD-10-CM

## 2017-06-08 NOTE — Patient Instructions (Addendum)
Your physician wants you to follow-up in:3 months  with Dr Koneswaran       Your physician recommends that you continue on your current medications as directed. Please refer to the Current Medication list given to you today.    If you need a refill on your cardiac medications before your next appointment, please call your pharmacy.    No lab work or tests ordered today.      Thank you for choosing [facility] !        

## 2017-06-08 NOTE — Progress Notes (Signed)
SUBJECTIVE: The patient presents for routine follow-up. She reportedly has a history of a non-Q wave myocardial infarction dating back to 2003. She apparently underwent cardiac catheterization but did not require percutaneous coronary intervention. She has a history of hypercholesterolemia. She has been a widow since June 14, 2014when her husband died of liver cancer.  I reviewed her coronary angiography report from 01/13/2002 which demonstrated a large first diagonal branch with a proximal 95% stenosis. 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  She avoids taking nitroglycerin due to headaches.  Echocardiogram 04/23/17 demonstrated normal left ventricular systolic function and regional wall motion, LVEF 60-65%, mild LVH, grade 1 diastolic dysfunction, mild to moderate mitral regurgitation, and moderate tricuspid regurgitation.  Nuclear stress test on 04/27/17 was low risk.  There is a brief 7 beat run of atrial tachycardia during recovery.  There were no perfusion abnormalities.  The patient denies any symptoms of chest pain, palpitations, shortness of breath, lightheadedness, dizziness, leg swelling, orthopnea, PND, and syncope.    Review of Systems: As per "subjective", otherwise negative.  Allergies  Allergen Reactions  . Codeine Nausea Only  . Lipitor [Atorvastatin Calcium] Other (See Comments)    Muscle aches  . Simvastatin Other (See Comments)    Muscle aches    Current Outpatient Medications  Medication Sig Dispense Refill  . aspirin 81 MG tablet Take 81 mg by mouth daily.      . Calcium-Vitamin D (POSTURE-D PO) Take 600 mg by mouth 2 (two) times daily.      . Cholecalciferol (VITAMIN D) 1000 UNITS capsule Take 1,000 Units by mouth daily.     . clopidogrel (PLAVIX) 75 MG tablet TAKE (1) TABLET BY MOUTH ONCE DAILY. 90 tablet 3  . Coenzyme Q10 (COQ10) 100 MG CAPS Take 100 mg by mouth daily.     . Methylcellulose, Laxative,  (CITRUCEL PO) Take 1 Dose by mouth daily.     . metoprolol tartrate (LOPRESSOR) 50 MG tablet TAKE (1/2) TABLET BY MOUTH TWICE DAILY. 90 tablet 1  . nitroGLYCERIN (NITROSTAT) 0.4 MG SL tablet Place 1 tablet (0.4 mg total) under the tongue every 5 (five) minutes as needed for chest pain. 25 tablet 3  . Omega-3 Fatty Acids (FISH OIL) 1200 MG CAPS Take 1,200 mg by mouth daily.    . rosuvastatin (CRESTOR) 5 MG tablet TAKE 1 TABLET BY MOUTH DAILY. 90 tablet 0   No current facility-administered medications for this visit.     Past Medical History:  Diagnosis Date  . Hyperlipidemia   . Hypertension   . Ischemic heart disease   . Myocardial infarction (HCC) 2003  . Osteopenia 04/2015   T score -1.3. Stable from prior DEXA    Past Surgical History:  Procedure Laterality Date  . ABDOMINAL HYSTERECTOMY  1984   TAH bleeding adenomyosis  . BREAST SURGERY     Breast bx    Social History   Socioeconomic History  . Marital status: Widowed    Spouse name: Not on file  . Number of children: Not on file  . Years of education: Not on file  . Highest education level: Not on file  Social Needs  . Financial resource strain: Not on file  . Food insecurity - worry: Not on file  . Food insecurity - inability: Not on file  . Transportation needs - medical: Not on file  . Transportation needs - non-medical: Not on file  Occupational  History  . Not on file  Tobacco Use  . Smoking status: Never Smoker  . Smokeless tobacco: Never Used  Substance and Sexual Activity  . Alcohol use: No    Alcohol/week: 0.0 oz  . Drug use: No  . Sexual activity: No    Birth control/protection: Surgical    Comment: 1st intercourse 77 yo-Fewer than 5 partners  Other Topics Concern  . Not on file  Social History Narrative  . Not on file     Vitals:   06/08/17 1455  BP: 128/70  Pulse: 75  SpO2: 96%  Weight: 161 lb (73 kg)  Height: 5\' 8"  (1.727 m)    Wt Readings from Last 3 Encounters:  06/08/17 161 lb  (73 kg)  05/13/17 160 lb (72.6 kg)  04/20/17 157 lb (71.2 kg)     PHYSICAL EXAM General: NAD HEENT: Normal. Neck: No JVD, no thyromegaly. Lungs: Clear to auscultation bilaterally with normal respiratory effort. CV: Regular rate and rhythm, normal S1/S2, no S3/S4, no murmur. No pretibial or periankle edema.  No carotid bruit.   Abdomen: Soft, nontender, no distention.  Neurologic: Alert and oriented.  Psych: Normal affect. Skin: Normal. Musculoskeletal: No gross deformities.    ECG: Most recent ECG reviewed.   Labs: Lab Results  Component Value Date/Time   K 4.3 03/12/2015 08:40 AM   BUN 24 03/12/2015 08:40 AM   CREATININE 0.95 (H) 03/12/2015 08:40 AM   ALT 16 03/12/2015 08:40 AM     Lipids: Lab Results  Component Value Date/Time   LDLCALC 82 03/12/2015 08:40 AM   CHOL 169 03/12/2015 08:40 AM   TRIG 49 03/12/2015 08:40 AM   HDL 77 03/12/2015 08:40 AM       ASSESSMENT AND PLAN: 1.  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  Echocardiogram and nuclear stress test reviewed above with normal left ventricular systolic function and no evidence of ischemia.  Continue aspirin, Plavix, metoprolol, and statin. We previously had a lengthy discussion regarding both antiplatelet and statin therapy. Given that she has done so well all these years free of symptoms and without bleeding problems, I think it is reasonable to continue Plavix.  However, it may be more reasonable to stop Plavix and start rivaroxaban 5 mg twice daily.  2.  Hyperlipidemia: Continue Crestor 5 mg daily.  3.  Valvular heart disease: Echocardiogram reviewed above with both mitral and tricuspid regurgitation.  I will continue to monitor with clinical and echocardiographic surveillance.   Disposition: Follow up 3 months   Prentice DockerSuresh Koneswaran, M.D., F.A.C.C.

## 2017-06-12 DIAGNOSIS — J028 Acute pharyngitis due to other specified organisms: Secondary | ICD-10-CM | POA: Diagnosis not present

## 2017-06-12 DIAGNOSIS — R509 Fever, unspecified: Secondary | ICD-10-CM | POA: Diagnosis not present

## 2017-07-29 DIAGNOSIS — H2513 Age-related nuclear cataract, bilateral: Secondary | ICD-10-CM | POA: Diagnosis not present

## 2017-08-05 ENCOUNTER — Other Ambulatory Visit: Payer: Self-pay | Admitting: Cardiovascular Disease

## 2017-08-27 DIAGNOSIS — R140 Abdominal distension (gaseous): Secondary | ICD-10-CM | POA: Diagnosis not present

## 2017-08-27 DIAGNOSIS — R932 Abnormal findings on diagnostic imaging of liver and biliary tract: Secondary | ICD-10-CM | POA: Diagnosis not present

## 2017-08-27 DIAGNOSIS — R198 Other specified symptoms and signs involving the digestive system and abdomen: Secondary | ICD-10-CM | POA: Diagnosis not present

## 2017-08-27 DIAGNOSIS — K219 Gastro-esophageal reflux disease without esophagitis: Secondary | ICD-10-CM | POA: Diagnosis not present

## 2017-09-01 ENCOUNTER — Encounter: Payer: Self-pay | Admitting: Gynecology

## 2017-09-01 DIAGNOSIS — E7800 Pure hypercholesterolemia, unspecified: Secondary | ICD-10-CM | POA: Diagnosis not present

## 2017-09-01 DIAGNOSIS — R635 Abnormal weight gain: Secondary | ICD-10-CM | POA: Diagnosis not present

## 2017-09-01 DIAGNOSIS — I2510 Atherosclerotic heart disease of native coronary artery without angina pectoris: Secondary | ICD-10-CM | POA: Diagnosis not present

## 2017-09-09 ENCOUNTER — Ambulatory Visit: Payer: PPO | Admitting: Cardiovascular Disease

## 2017-09-09 ENCOUNTER — Encounter: Payer: Self-pay | Admitting: Cardiovascular Disease

## 2017-09-09 VITALS — BP 142/82 | HR 64 | Ht 68.0 in | Wt 165.8 lb

## 2017-09-09 DIAGNOSIS — E785 Hyperlipidemia, unspecified: Secondary | ICD-10-CM

## 2017-09-09 DIAGNOSIS — I38 Endocarditis, valve unspecified: Secondary | ICD-10-CM | POA: Diagnosis not present

## 2017-09-09 DIAGNOSIS — I25118 Atherosclerotic heart disease of native coronary artery with other forms of angina pectoris: Secondary | ICD-10-CM | POA: Diagnosis not present

## 2017-09-09 DIAGNOSIS — I10 Essential (primary) hypertension: Secondary | ICD-10-CM

## 2017-09-09 NOTE — Progress Notes (Signed)
SUBJECTIVE: The patient presents for routine follow-up. She reportedly has a history of a non-Q wave myocardial infarction dating back to 2003. She apparently underwent cardiac catheterization but did not require percutaneous coronary intervention. She has a history of hypercholesterolemia. She has been a widow since 2014-06-11when her husband died of liver cancer.  I reviewed her coronary angiography report from 01/13/2002 which demonstrated a large first diagonal branch with a proximal 95% stenosis. 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  She avoids taking nitroglycerin due to headaches.  Echocardiogram 04/23/17 demonstrated normal left ventricular systolic function and regional wall motion, LVEF 60-65%, mild LVH, grade 1 diastolic dysfunction, mild to moderate mitral regurgitation, and moderate tricuspid regurgitation.  Nuclear stress test on 04/27/17 was low risk.  There is a brief 7 beat run of atrial tachycardia during recovery.  There were no perfusion abnormalities.  The patient denies any symptoms of chest pain, palpitations, shortness of breath, lightheadedness, dizziness, leg swelling, orthopnea, PND, and syncope.  She has gained some weight and says she enjoys eating sweets and has been eating more frequently.  She does not cook for herself as she lives alone and eats out quite a bit.  She attends the [facility] [employer] semi-regularly.     Review of Systems: As per "subjective", otherwise negative.  Allergies  Allergen Reactions  . Codeine Nausea Only  . Lipitor [Atorvastatin Calcium] Other (See Comments)    Muscle aches  . Simvastatin Other (See Comments)    Muscle aches    Current Outpatient Medications  Medication Sig Dispense Refill  . aspirin 81 MG tablet Take 81 mg by mouth daily.      . Calcium-Vitamin D (POSTURE-D PO) Take 600 mg by mouth 2 (two) times daily.      . Cholecalciferol (VITAMIN D) 1000 UNITS capsule  Take 1,000 Units by mouth daily.     . clopidogrel (PLAVIX) 75 MG tablet TAKE (1) TABLET BY MOUTH ONCE DAILY. 90 tablet 3  . Coenzyme Q10 (COQ10) 100 MG CAPS Take 100 mg by mouth daily.     . Methylcellulose, Laxative, (CITRUCEL PO) Take 1 Dose by mouth daily.     . metoprolol tartrate (LOPRESSOR) 50 MG tablet TAKE (1/2) TABLET BY MOUTH TWICE DAILY. 90 tablet 1  . nitroGLYCERIN (NITROSTAT) 0.4 MG SL tablet Place 1 tablet (0.4 mg total) under the tongue every 5 (five) minutes as needed for chest pain. 25 tablet 3  . Omega-3 Fatty Acids (FISH OIL) 1200 MG CAPS Take 1,200 mg by mouth daily.    . Probiotic Product (ALIGN) 4 MG CAPS Take 4 mg by mouth daily.    . rosuvastatin (CRESTOR) 5 MG tablet TAKE 1 TABLET BY MOUTH DAILY. 30 tablet 0   No current facility-administered medications for this visit.     Past Medical History:  Diagnosis Date  . Hyperlipidemia   . Hypertension   . Ischemic heart disease   . Myocardial infarction (HCC) 2003  . Osteopenia 04/2015   T score -1.3. Stable from prior DEXA    Past Surgical History:  Procedure Laterality Date  . ABDOMINAL HYSTERECTOMY  1984   TAH bleeding adenomyosis  . BREAST SURGERY     Breast bx    Social History   Socioeconomic History  . Marital status: Widowed    Spouse name: Not on file  . Number of children: Not on file  . Years of education: Not on  file  . Highest education level: Not on file  Occupational History  . Not on file  Social Needs  . Financial resource strain: Not on file  . Food insecurity:    Worry: Not on file    Inability: Not on file  . Transportation needs:    Medical: Not on file    Non-medical: Not on file  Tobacco Use  . Smoking status: Never Smoker  . Smokeless tobacco: Never Used  Substance and Sexual Activity  . Alcohol use: No    Alcohol/week: 0.0 oz  . Drug use: No  . Sexual activity: Never    Birth control/protection: Surgical    Comment: 1st intercourse 77 yo-Fewer than 5 partners    Lifestyle  . Physical activity:    Days per week: Not on file    Minutes per session: Not on file  . Stress: Not on file  Relationships  . Social connections:    Talks on phone: Not on file    Gets together: Not on file    Attends religious service: Not on file    Active member of club or organization: Not on file    Attends meetings of clubs or organizations: Not on file    Relationship status: Not on file  . Intimate partner violence:    Fear of current or ex partner: Not on file    Emotionally abused: Not on file    Physically abused: Not on file    Forced sexual activity: Not on file  Other Topics Concern  . Not on file  Social History Narrative  . Not on file     Vitals:   09/09/17 0933  BP: (!) 142/82  Pulse: 64  SpO2: 98%  Weight: 165 lb 12.8 oz (75.2 kg)  Height:  (1.727 m)    Wt Readings from Last 3 Encounters:  09/09/17 165 lb 12.8 oz (75.2 kg)  06/08/17 161 lb (73 kg)  05/13/17 160 lb (72.6 kg)     PHYSICAL EXAM General: NAD HEENT: Normal. Neck: No JVD, no thyromegaly. Lungs: Clear to auscultation bilaterally with normal respiratory effort. CV: Regular rate and rhythm, normal S1/S2, no S3/S4, no murmur. No pretibial or periankle edema.  No carotid bruit.   Abdomen: Soft, nontender, no distention.  Neurologic: Alert and oriented.  Psych: Normal affect. Skin: Normal. Musculoskeletal: No gross deformities.    ECG: Most recent ECG reviewed.   Labs: Lab Results  Component Value Date/Time   K 4.3 03/12/2015 08:40 AM   BUN 24 03/12/2015 08:40 AM   CREATININE 0.95 (H) 03/12/2015 08:40 AM   ALT 16 03/12/2015 08:40 AM     Lipids: Lab Results  Component Value Date/Time   LDLCALC 82 03/12/2015 08:40 AM   CHOL 169 03/12/2015 08:40 AM   TRIG 49 03/12/2015 08:40 AM   HDL 77 03/12/2015 08:40 AM       ASSESSMENT AND PLAN:  1.  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  Echocardiogram and nuclear stress test  reviewed above with normal left ventricular systolic function and no evidence of ischemia.  Continue aspirin, Plavix, metoprolol, and statin. We previously had a lengthy discussion regarding both antiplatelet and statin therapy. Given that she has done so well all these years free of symptoms and without bleeding problems, I think it is reasonable to continue Plavix.  However, it may be more reasonable to stop Plavix and start rivaroxaban 2.5 mg twice daily.  2.  Hyperlipidemia: Continue Crestor 5  mg daily.  3.  Valvular heart disease: Echocardiogram reviewed above with both mitral and tricuspid regurgitation.  I will continue to monitor with clinical and echocardiographic surveillance.  4.  Hypertension: Blood pressure is mildly elevated.  This will need further monitoring.    Disposition: Follow up 6 months   Prentice Docker, M.D., F.A.C.C.

## 2017-09-09 NOTE — Patient Instructions (Signed)
Medication Instructions:  Your physician recommends that you continue on your current medications as directed. Please refer to the Current Medication list given to you today.   Labwork: I WILL REQUEST A COPY OF LABS FROM PCP.   Testing/Procedures: NONE  Follow-Up: Your physician wants you to follow-up in: 6 MONTHS.  You will receive a reminder letter in the mail two months in advance. If you don't receive a letter, please call our office to schedule the follow-up appointment.   Any Other Special Instructions Will Be Listed Below (If Applicable).     If you need a refill on your cardiac medications before your next appointment, please call your pharmacy.   

## 2017-09-28 ENCOUNTER — Other Ambulatory Visit: Payer: Self-pay | Admitting: Cardiovascular Disease

## 2017-10-05 ENCOUNTER — Other Ambulatory Visit: Payer: Self-pay | Admitting: Cardiovascular Disease

## 2017-10-13 DIAGNOSIS — H18413 Arcus senilis, bilateral: Secondary | ICD-10-CM | POA: Diagnosis not present

## 2017-10-13 DIAGNOSIS — H25013 Cortical age-related cataract, bilateral: Secondary | ICD-10-CM | POA: Diagnosis not present

## 2017-10-13 DIAGNOSIS — H25043 Posterior subcapsular polar age-related cataract, bilateral: Secondary | ICD-10-CM | POA: Diagnosis not present

## 2017-10-13 DIAGNOSIS — H2513 Age-related nuclear cataract, bilateral: Secondary | ICD-10-CM | POA: Diagnosis not present

## 2017-11-23 ENCOUNTER — Other Ambulatory Visit: Payer: Self-pay | Admitting: Cardiovascular Disease

## 2017-11-26 HISTORY — PX: CATARACT EXTRACTION W/ INTRAOCULAR LENS IMPLANT: SHX1309

## 2017-12-07 DIAGNOSIS — H52201 Unspecified astigmatism, right eye: Secondary | ICD-10-CM | POA: Diagnosis not present

## 2017-12-07 DIAGNOSIS — H2511 Age-related nuclear cataract, right eye: Secondary | ICD-10-CM | POA: Diagnosis not present

## 2017-12-08 DIAGNOSIS — H2512 Age-related nuclear cataract, left eye: Secondary | ICD-10-CM | POA: Diagnosis not present

## 2017-12-09 DIAGNOSIS — R945 Abnormal results of liver function studies: Secondary | ICD-10-CM | POA: Diagnosis not present

## 2017-12-09 DIAGNOSIS — I2510 Atherosclerotic heart disease of native coronary artery without angina pectoris: Secondary | ICD-10-CM | POA: Diagnosis not present

## 2017-12-09 DIAGNOSIS — N183 Chronic kidney disease, stage 3 (moderate): Secondary | ICD-10-CM | POA: Diagnosis not present

## 2017-12-09 DIAGNOSIS — Z0000 Encounter for general adult medical examination without abnormal findings: Secondary | ICD-10-CM | POA: Diagnosis not present

## 2017-12-09 DIAGNOSIS — E7800 Pure hypercholesterolemia, unspecified: Secondary | ICD-10-CM | POA: Diagnosis not present

## 2017-12-21 DIAGNOSIS — H52202 Unspecified astigmatism, left eye: Secondary | ICD-10-CM | POA: Diagnosis not present

## 2017-12-21 DIAGNOSIS — H2512 Age-related nuclear cataract, left eye: Secondary | ICD-10-CM | POA: Diagnosis not present

## 2018-01-18 DIAGNOSIS — Z1231 Encounter for screening mammogram for malignant neoplasm of breast: Secondary | ICD-10-CM | POA: Diagnosis not present

## 2018-01-21 IMAGING — NM NM MYOCAR MULTI W/SPECT W/WALL MOTION & EF
2 series · 12 of 12 positions shown · non-contrast
Comparison: none

[Series 1: rest · 6.51mm/px · 6 of 64 frames shown]
[frame 6/64]
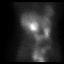
[frame 16/64]
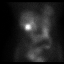
[frame 27/64]
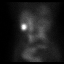
[frame 38/64]
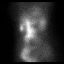
[frame 48/64]
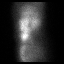
[frame 59/64]
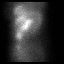

[Series 3: stress gated - perfusion · 6.51mm/px · 6 of 64 frames shown]
[frame 6/64]
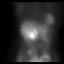
[frame 16/64]
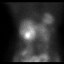
[frame 27/64]
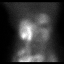
[frame 38/64]
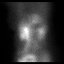
[frame 48/64]
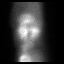
[frame 59/64]
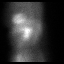

[12 of 12 positions shown; findings below may reference images not displayed]

Canned report from images found in remote index.

Refer to host system for actual result text.

## 2018-02-11 DIAGNOSIS — Z23 Encounter for immunization: Secondary | ICD-10-CM | POA: Diagnosis not present

## 2018-02-16 IMAGING — US US ABDOMEN COMPLETE
1 series · 13 of 25 positions shown · non-contrast
Comparison: CT 08/07/2015.  Ultrasound 11/29/2012.

CLINICAL DATA: Abdominal pain.

EXAM:
ABDOMEN ULTRASOUND COMPLETE

[Series 1: us abdomen complete · 0.20mm/px · 13 of 92 slices shown]
[im 1/92]
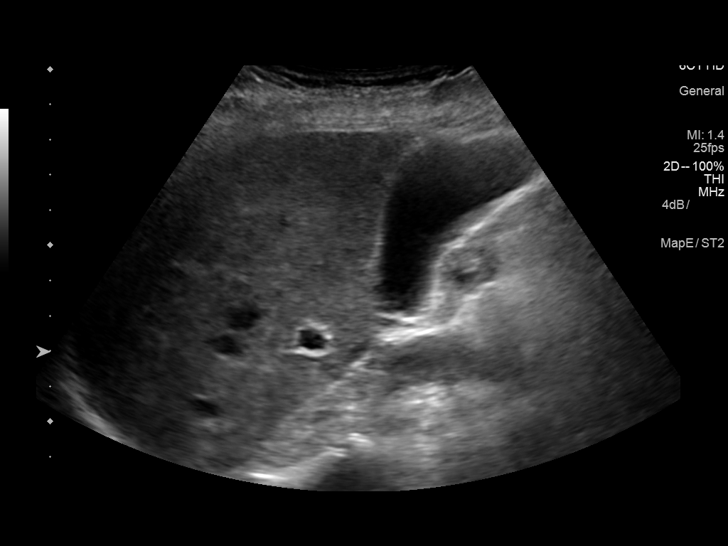
[im 8/92]
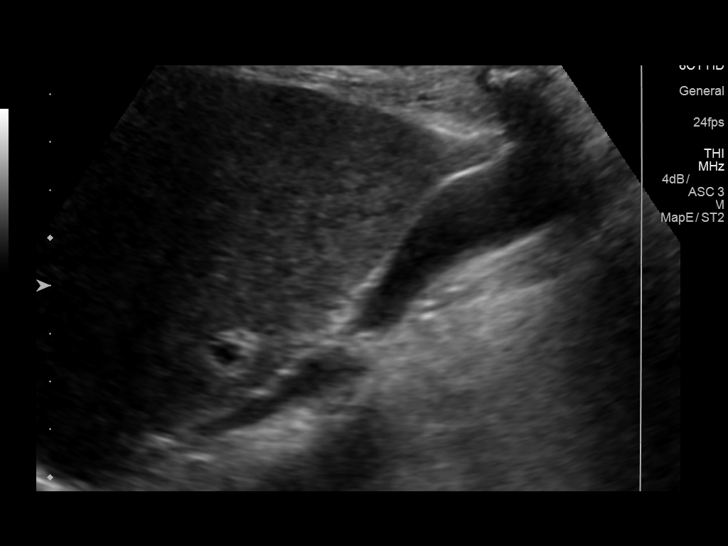
[im 16/92]
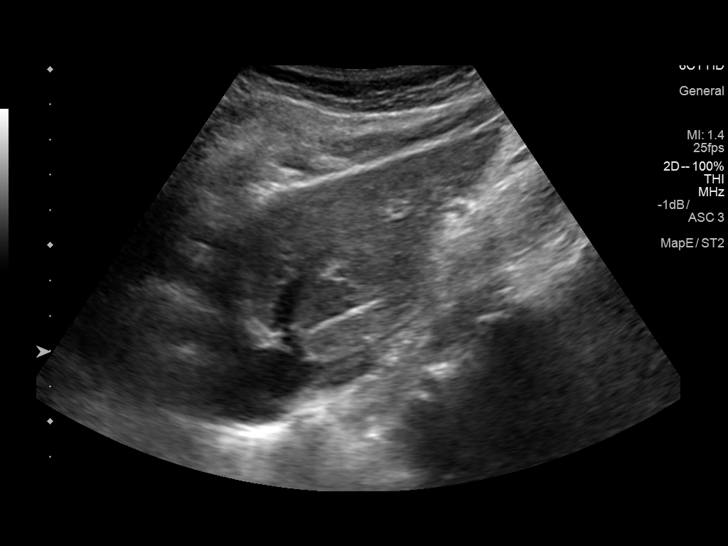
[im 23/92]
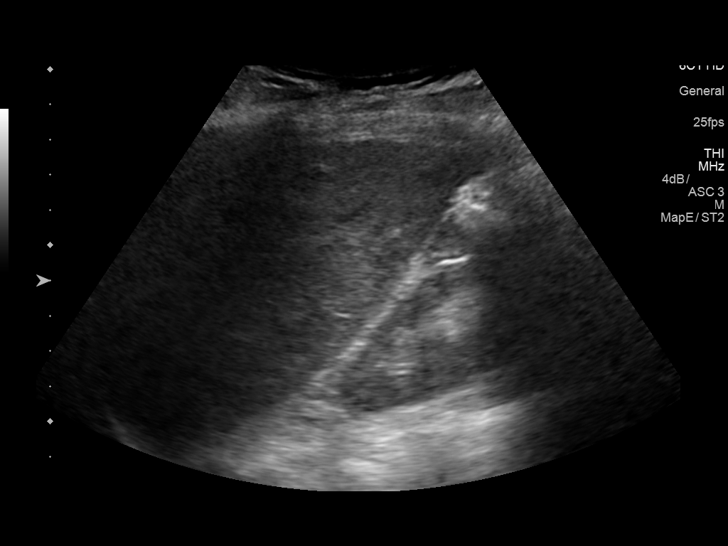
[im 31/92]
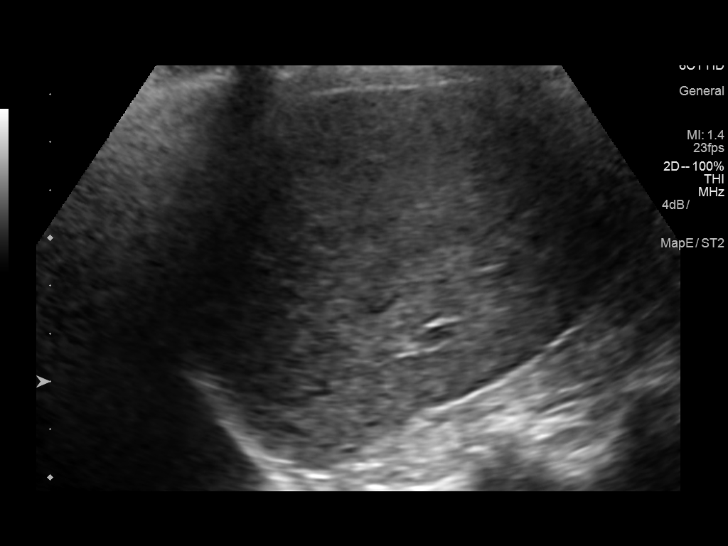
[im 38/92]
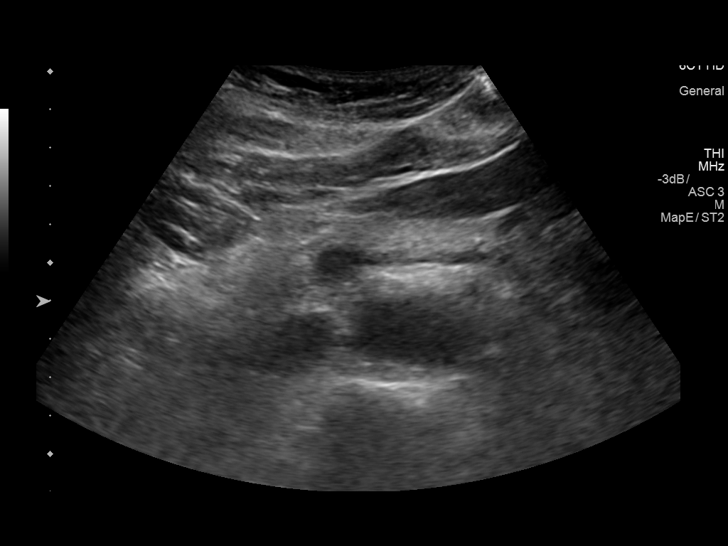
[im 46/92]
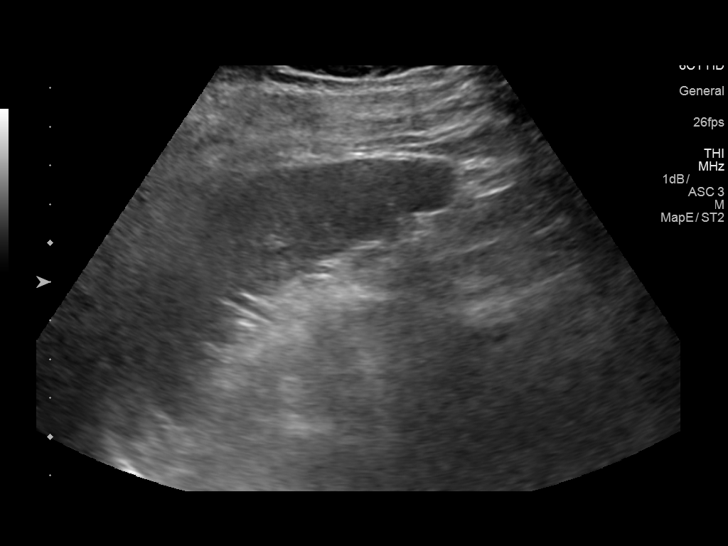
[im 54/92]
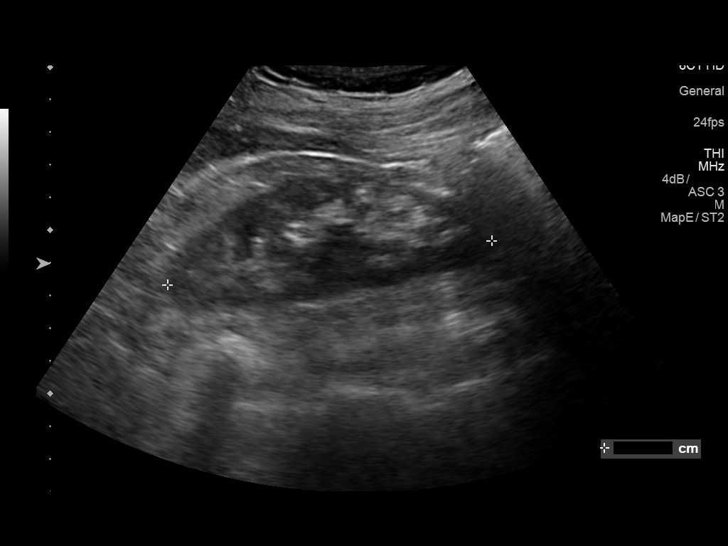
[im 61/92]
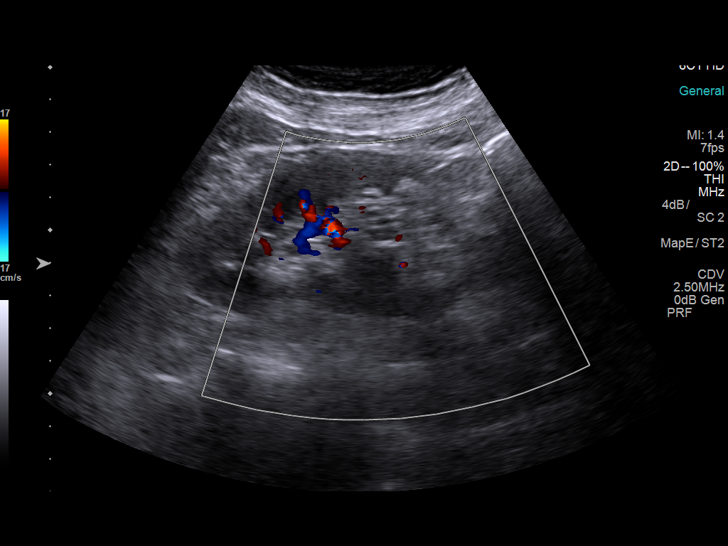
[im 69/92]
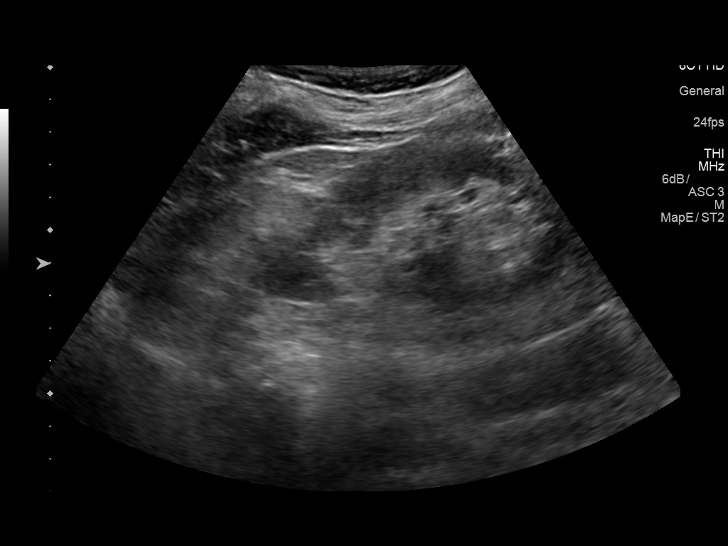
[im 76/92]
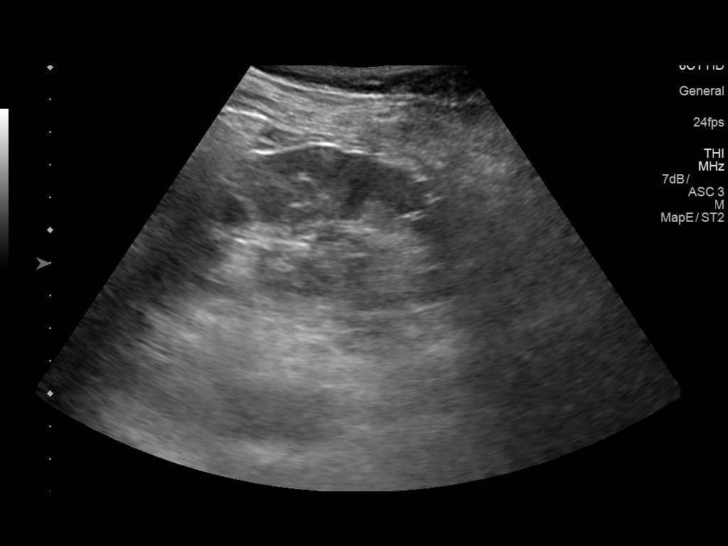
[im 84/92]
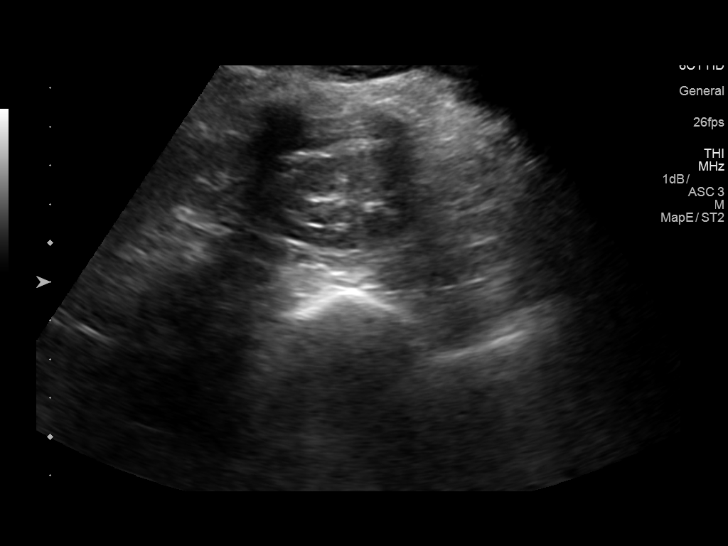
[im 92/92]
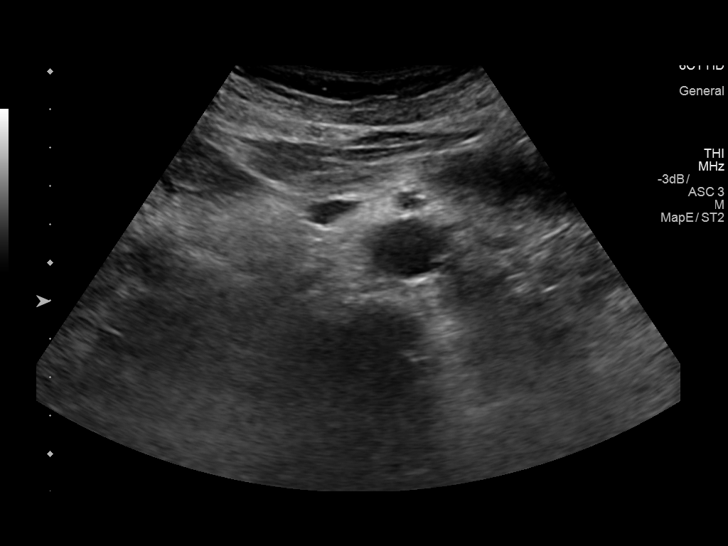

[13 of 25 positions shown; findings below may reference images not displayed]

FINDINGS: Gallbladder: No gallstones or wall thickening visualized. No
sonographic Murphy sign noted by sonographer.

Common bile duct: Diameter: 3.7 mm

Liver: Coarse hepatic echotexture consistent fatty infiltration
and/or hepatocellular disease. Liver contour is slightly irregular,
cirrhosis cannot be excluded. Portal vein is patent on color Doppler
imaging with normal direction of blood flow towards the liver.

IVC: No abnormality visualized.

Pancreas: Visualized portion unremarkable.

Spleen: Size and appearance within normal limits.

Right Kidney: Length: 11.2 cm. Echogenicity within normal limits.
Malrotation again noted. No mass or hydronephrosis visualized.

Left Kidney: Length: 10.0 cm. Echogenicity within normal limits. No
mass or hydronephrosis visualized.

Abdominal aorta: No aneurysm visualized.

Other findings: None.
IMPRESSION: 1. No acute abnormality identified. No evidence of gallstones or
biliary distention.

2. No hydronephrosis or acute renal abnormality. Right kidney is
malrotated, this is stable.

3. Liver has a coarse echotexture suggesting fatty infiltration
and/or hepatocellular disease. Slightly lobular hepatic contour
noted. Cirrhosis cannot be excluded.

## 2018-04-12 ENCOUNTER — Encounter: Payer: Self-pay | Admitting: Cardiovascular Disease

## 2018-04-12 ENCOUNTER — Ambulatory Visit: Payer: PPO | Admitting: Cardiovascular Disease

## 2018-04-12 VITALS — BP 118/80 | HR 62 | Ht 68.0 in | Wt 176.8 lb

## 2018-04-12 DIAGNOSIS — I10 Essential (primary) hypertension: Secondary | ICD-10-CM | POA: Diagnosis not present

## 2018-04-12 DIAGNOSIS — I38 Endocarditis, valve unspecified: Secondary | ICD-10-CM | POA: Diagnosis not present

## 2018-04-12 DIAGNOSIS — E785 Hyperlipidemia, unspecified: Secondary | ICD-10-CM | POA: Diagnosis not present

## 2018-04-12 DIAGNOSIS — I25118 Atherosclerotic heart disease of native coronary artery with other forms of angina pectoris: Secondary | ICD-10-CM

## 2018-04-12 DIAGNOSIS — I252 Old myocardial infarction: Secondary | ICD-10-CM | POA: Diagnosis not present

## 2018-04-12 MED ORDER — ROSUVASTATIN CALCIUM 10 MG PO TABS: 10.0000 mg | ORAL_TABLET | Freq: Every day | ORAL | 3 refills | Status: DC

## 2018-04-12 NOTE — Patient Instructions (Signed)
Medication Instructions:  INCREASE Crestor to 10 mg daily at dinner If you need a refill on your cardiac medications before your next appointment, please call your pharmacy.   Lab work: None If you have labs (blood work) drawn today and your tests are completely normal, you will receive your results only by: Marland Kitchen. MyChart Message (if you have MyChart) OR . A paper copy in the mail If you have any lab test that is abnormal or we need to change your treatment, we will call you to review the results.  Testing/Procedures: None  Follow-Up: At [facility], you and your health needs are our priority.  As part of our continuing mission to provide you with exceptional heart care, we have created designated Provider Care Teams.  These Care Teams include your primary Cardiologist (physician) and Advanced Practice Providers (APPs -  Physician Assistants and Nurse Practitioners) who all work together to provide you with the care you need, when you need it. You will need a follow up appointment in 6 months.  Please call our office 2 months in advance to schedule this appointment.  You may see Prentice DockerSuresh Koneswaran, MD or one of the following Advanced Practice Providers on your designated Care Team:   Randall AnBrittany Strader, PA-C [facility]([place] Office) . Jacolyn ReedyMichele Lenze, PA-C [facility]([place] Office)  Any Other Special Instructions Will Be Listed Below (If Applicable). None

## 2018-04-12 NOTE — Progress Notes (Signed)
SUBJECTIVE: The patient presents for routine follow-up. She reportedly has a history of a non-Q wave myocardial infarction dating back to 2003. She apparently underwent cardiac catheterization but did not require percutaneous coronary intervention. She has a history of hypercholesterolemia. She has been a widow since June 2014 when her husband died of liver cancer.  I reviewed her coronary angiography report from 01/13/2002 which demonstrated a large first diagonal branch with a proximal 95% stenosis. 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  She avoids taking nitroglycerin due to headaches.  Echocardiogram 04/23/17 demonstrated normal left ventricular systolic function and regional wall motion, LVEF 60-65%, mild LVH, grade 1 diastolic dysfunction, mild to moderate mitral regurgitation, and moderate tricuspid regurgitation.  Nuclear stress test on 04/27/17 was low risk. There is a brief 7 beat run of atrial tachycardia during recovery. There were no perfusion abnormalities.  The patient denies any symptoms of chest pain, palpitations, shortness of breath, lightheadedness, dizziness, orthopnea, PND, and syncope.  If she sits for prolonged periods of time or eats too much salt she develops swelling in her ankles and feet.  She also has a history of venous disease with treatment for this over 5 years ago.    Review of Systems: As per "subjective", otherwise negative.  Allergies  Allergen Reactions  . Codeine Nausea Only  . Lipitor [Atorvastatin Calcium] Other (See Comments)    Muscle aches  . Simvastatin Other (See Comments)    Muscle aches    Current Outpatient Medications  Medication Sig Dispense Refill  . aspirin 81 MG tablet Take 81 mg by mouth daily.      . Calcium-Vitamin D (POSTURE-D PO) Take 600 mg by mouth 2 (two) times daily.      . Cholecalciferol (VITAMIN D) 1000 UNITS capsule Take 1,000 Units by mouth daily.      . clopidogrel (PLAVIX) 75 MG tablet TAKE (1) TABLET BY MOUTH ONCE DAILY. 90 tablet 1  . Coenzyme Q10 (COQ10) 100 MG CAPS Take 100 mg by mouth daily.     . Methylcellulose, Laxative, (CITRUCEL PO) Take 1 Dose by mouth daily.     . metoprolol tartrate (LOPRESSOR) 50 MG tablet TAKE (1/2) TABLET BY MOUTH TWICE DAILY. 90 tablet 1  . nitroGLYCERIN (NITROSTAT) 0.4 MG SL tablet Place 1 tablet (0.4 mg total) under the tongue every 5 (five) minutes as needed for chest pain. 25 tablet 3  . Omega-3 Fatty Acids (FISH OIL) 1200 MG CAPS Take 1,200 mg by mouth daily.    . rosuvastatin (CRESTOR) 5 MG tablet TAKE 1 TABLET BY MOUTH DAILY. 30 tablet 6   No current facility-administered medications for this visit.     Past Medical History:  Diagnosis Date  . Hyperlipidemia   . Hypertension   . Ischemic heart disease   . Myocardial infarction (HCC) 2003  . Osteopenia 04/2015   T score -1.3. Stable from prior DEXA    Past Surgical History:  Procedure Laterality Date  . ABDOMINAL HYSTERECTOMY  1984   TAH bleeding adenomyosis  . BREAST SURGERY     Breast bx    Social History   Socioeconomic History  . Marital status: Widowed    Spouse name: Not on file  . Number of children: Not on file  . Years of education: Not on file  . Highest education level: Not on file  Occupational History  . Not on file  Social Needs  . Financial  resource strain: Not on file  . Food insecurity:    Worry: Not on file    Inability: Not on file  . Transportation needs:    Medical: Not on file    Non-medical: Not on file  Tobacco Use  . Smoking status: Never Smoker  . Smokeless tobacco: Never Used  Substance and Sexual Activity  . Alcohol use: No    Alcohol/week: 0.0 standard drinks  . Drug use: No  . Sexual activity: Never    Birth control/protection: Surgical    Comment: 1st intercourse 77 yo-Fewer than 5 partners  Lifestyle  . Physical activity:    Days per week: Not on file    Minutes per session: Not  on file  . Stress: Not on file  Relationships  . Social connections:    Talks on phone: Not on file    Gets together: Not on file    Attends religious service: Not on file    Active member of club or organization: Not on file    Attends meetings of clubs or organizations: Not on file    Relationship status: Not on file  . Intimate partner violence:    Fear of current or ex partner: Not on file    Emotionally abused: Not on file    Physically abused: Not on file    Forced sexual activity: Not on file  Other Topics Concern  . Not on file  Social History Narrative  . Not on file     Vitals:   04/12/18 1114  BP: 118/80  Pulse: 62  SpO2: 98%  Weight: 176 lb 12.8 oz (80.2 kg)  Height: 5\' 8"  (1.727 m)    Wt Readings from Last 3 Encounters:  04/12/18 176 lb 12.8 oz (80.2 kg)  09/09/17 165 lb 12.8 oz (75.2 kg)  06/08/17 161 lb (73 kg)     PHYSICAL EXAM General: NAD HEENT: Normal. Neck: No JVD, no thyromegaly. Lungs: Clear to auscultation bilaterally with normal respiratory effort. CV: Regular rate and rhythm, normal S1/S2, no S3/S4, no murmur. No pretibial or periankle edema.  No carotid bruit.   Abdomen: Soft, nontender, no distention.  Neurologic: Alert and oriented.  Psych: Normal affect. Skin: Normal. Musculoskeletal: No gross deformities.    ECG: Reviewed above under Subjective   Labs: Lab Results  Component Value Date/Time   K 4.3 03/12/2015 08:40 AM   BUN 24 03/12/2015 08:40 AM   CREATININE 0.95 (H) 03/12/2015 08:40 AM   ALT 16 03/12/2015 08:40 AM     Lipids: Lab Results  Component Value Date/Time   LDLCALC 82 03/12/2015 08:40 AM   CHOL 169 03/12/2015 08:40 AM   TRIG 49 03/12/2015 08:40 AM   HDL 77 03/12/2015 08:40 AM       ASSESSMENT AND PLAN:  1. 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 Echocardiogram and nuclear stress test reviewed above with normal left ventricular systolic function and no evidence of  ischemia. Continue aspirin, Plavix, metoprolol, and statin (I will increase Crestor to 10 mg daily). We previously had a lengthy discussion regarding both antiplatelet and statin therapy. Given that she has done so well all these years free of symptoms and without bleeding problems, I think it is reasonable to continue Plavix.However, it may be more reasonable to stop Plavix and start rivaroxaban 2.5 mg twice daily.  2. Hyperlipidemia: Currently taking Crestor 5 mg daily.  LDL 89 on 09/01/2017.  Triglycerides, total cholesterol, and HDL were normal.  I will increase  Crestor to 10 mg daily.  I told her to please inform me if she were to develop increased myalgias and/or joint pains and before stopping or reducing the dose of Crestor.  3. Valvular heart disease: Echocardiogram reviewed above with both mitral and tricuspid regurgitation. I will continue to monitor with clinical and echocardiographic surveillance.  4.  Hypertension: BP is normal.  No changes to therapy.   Disposition: Follow up 6 months   Prentice Docker, M.D., F.A.C.C.

## 2018-04-20 DIAGNOSIS — N183 Chronic kidney disease, stage 3 (moderate): Secondary | ICD-10-CM | POA: Diagnosis not present

## 2018-04-20 DIAGNOSIS — E7800 Pure hypercholesterolemia, unspecified: Secondary | ICD-10-CM | POA: Diagnosis not present

## 2018-04-20 DIAGNOSIS — I2510 Atherosclerotic heart disease of native coronary artery without angina pectoris: Secondary | ICD-10-CM | POA: Diagnosis not present

## 2018-05-03 DIAGNOSIS — H26493 Other secondary cataract, bilateral: Secondary | ICD-10-CM | POA: Diagnosis not present

## 2018-05-03 DIAGNOSIS — H524 Presbyopia: Secondary | ICD-10-CM | POA: Diagnosis not present

## 2018-05-03 DIAGNOSIS — Z961 Presence of intraocular lens: Secondary | ICD-10-CM | POA: Diagnosis not present

## 2018-05-03 DIAGNOSIS — H52222 Regular astigmatism, left eye: Secondary | ICD-10-CM | POA: Diagnosis not present

## 2018-05-03 DIAGNOSIS — H5211 Myopia, right eye: Secondary | ICD-10-CM | POA: Diagnosis not present

## 2018-05-14 ENCOUNTER — Encounter: Payer: PPO | Admitting: Gynecology

## 2018-05-18 ENCOUNTER — Encounter: Payer: Self-pay | Admitting: Gynecology

## 2018-05-18 ENCOUNTER — Ambulatory Visit (INDEPENDENT_AMBULATORY_CARE_PROVIDER_SITE_OTHER): Payer: PPO | Admitting: Gynecology

## 2018-05-18 VITALS — BP 118/78 | Ht 68.0 in | Wt 171.0 lb

## 2018-05-18 DIAGNOSIS — M8580 Other specified disorders of bone density and structure, unspecified site: Secondary | ICD-10-CM

## 2018-05-18 DIAGNOSIS — N952 Postmenopausal atrophic vaginitis: Secondary | ICD-10-CM

## 2018-05-18 DIAGNOSIS — Z01419 Encounter for gynecological examination (general) (routine) without abnormal findings: Secondary | ICD-10-CM | POA: Diagnosis not present

## 2018-05-18 NOTE — Progress Notes (Signed)
    Kayla [name] 12/30/1940 [id number]        78 y.o.  [name] for breast and pelvic exam.  Without gynecologic complaints  Past medical history,surgical history, problem list, medications, allergies, family history and social history were all reviewed and documented as reviewed in the EPIC chart.  ROS:  Performed with pertinent positives and negatives included in the history, assessment and plan.   Additional significant findings : None   Exam: Kennon Portela assistant Vitals:   05/18/18 1218  BP: 118/78  Weight: 171 lb (77.6 kg)  Height: 5\' 8"  (1.727 m)   Body mass index is 26 kg/m.  General appearance:  Normal affect, orientation and appearance. Skin: Grossly normal HEENT: Without gross lesions.  No cervical or supraclavicular adenopathy. Thyroid normal.  Lungs:  Clear without wheezing, rales or rhonchi Cardiac: RR, without RMG Abdominal:  Soft, nontender, without masses, guarding, rebound, organomegaly or hernia Breasts:  Examined lying and sitting without masses, retractions, discharge or axillary adenopathy. Pelvic:  Ext, BUS, Vagina: With atrophic changes  Adnexa: Without masses or tenderness    Anus and perineum: Normal   Rectovaginal: Normal sphincter tone without palpated masses or tenderness.    Assessment/Plan:  78 y.o. G61P2002 female for breast and pelvic exam.  Status post TVH for DU B and adenomyosis.  1. Postmenopausal.  No significant menopausal symptoms. 2. Osteopenia.  DEXA 2018 T score -1.3.  Options to repeat now versus waiting another year to discussed.  Patient's mother with severe osteoporosis.  Patient prefers to follow-up now and she will schedule at a 2-year interval. 3. Mammography 12/2017.  Continue with annual mammography when due.  Breast exam normal today. 4. Colonoscopy 2013.  Repeat at their recommended interval. 5. Pap smear 2011.  No Pap smear done today.  No history of abnormal Pap smears.  We both agree to stop screening per current  screening guidelines based on hysterectomy and age. 6. Health maintenance.  No routine lab work done as patient does this elsewhere.  Follow-up for bone density.  Follow-up for annual exam in 1 year.   Dara Lords MD, 12:38 PM 05/18/2018

## 2018-05-18 NOTE — Patient Instructions (Signed)
Followup for bone density as scheduled. 

## 2018-05-19 DIAGNOSIS — K5901 Slow transit constipation: Secondary | ICD-10-CM | POA: Diagnosis not present

## 2018-05-19 DIAGNOSIS — R1030 Lower abdominal pain, unspecified: Secondary | ICD-10-CM | POA: Diagnosis not present

## 2018-05-19 DIAGNOSIS — R1033 Periumbilical pain: Secondary | ICD-10-CM | POA: Diagnosis not present

## 2018-05-19 DIAGNOSIS — K625 Hemorrhage of anus and rectum: Secondary | ICD-10-CM | POA: Diagnosis not present

## 2018-05-19 DIAGNOSIS — R198 Other specified symptoms and signs involving the digestive system and abdomen: Secondary | ICD-10-CM | POA: Diagnosis not present

## 2018-05-25 ENCOUNTER — Other Ambulatory Visit: Payer: Self-pay | Admitting: Cardiovascular Disease

## 2018-05-28 DIAGNOSIS — K625 Hemorrhage of anus and rectum: Secondary | ICD-10-CM | POA: Diagnosis not present

## 2018-06-25 ENCOUNTER — Other Ambulatory Visit: Payer: Self-pay | Admitting: Physician Assistant

## 2018-06-25 DIAGNOSIS — R1033 Periumbilical pain: Secondary | ICD-10-CM

## 2018-06-25 DIAGNOSIS — R1011 Right upper quadrant pain: Secondary | ICD-10-CM

## 2018-06-25 DIAGNOSIS — K5901 Slow transit constipation: Secondary | ICD-10-CM | POA: Diagnosis not present

## 2018-06-29 ENCOUNTER — Other Ambulatory Visit: Payer: Self-pay | Admitting: Cardiovascular Disease

## 2018-07-01 ENCOUNTER — Ambulatory Visit
Admission: RE | Admit: 2018-07-01 | Discharge: 2018-07-01 | Disposition: A | Discharge status: Home / Self Care | Payer: PPO | Source: Ambulatory Visit | Attending: Physician Assistant | Admitting: Physician Assistant

## 2018-07-01 DIAGNOSIS — R1033 Periumbilical pain: Secondary | ICD-10-CM

## 2018-07-01 DIAGNOSIS — R1011 Right upper quadrant pain: Secondary | ICD-10-CM | POA: Diagnosis not present

## 2018-07-05 DIAGNOSIS — K8020 Calculus of gallbladder without cholecystitis without obstruction: Secondary | ICD-10-CM | POA: Diagnosis not present

## 2018-07-08 DIAGNOSIS — K811 Chronic cholecystitis: Secondary | ICD-10-CM | POA: Diagnosis not present

## 2018-07-09 ENCOUNTER — Other Ambulatory Visit: Payer: Self-pay

## 2018-07-09 DIAGNOSIS — Z8249 Family history of ischemic heart disease and other diseases of the circulatory system: Secondary | ICD-10-CM | POA: Diagnosis not present

## 2018-07-19 LAB — HYPERCOAGULABLE PANEL, COMPREHENSIVE
APTT: 26.2 s
AT III ACT/NOR PPP CHRO: 93 %
Act. Prt C Resist w/FV Defic.: 2.7 ratio
Beta-2 Glycoprotein I, IgA: <10 SAU
Beta-2 Glycoprotein I, IgM: <10 SMU
DRVVT Screen Seconds: 40 s
FACTOR VII ANTIGEN: 98 %
FACTOR VIII ACTIVITY: 123 %
HEXAGONAL PHOSPHOLIPID NEUTRAL: 0 s
Homocysteine: 13.9 umol/L
PROT C AG ACT/NOR PPP IMM: 93 %
PROT S AG ACT/NOR PPP IMM: 84 %
Protein C Ag/FVII Ag Ratio**: 0.9 ratio
Protein S Ag/FVII Ag Ratio**: 0.9 ratio

## 2018-07-19 LAB — ACTIVATED PROTEIN C RESISTANCE: Activated Protein C Resistance: 2.8 ratio (ref 2.2–3.5)

## 2018-07-19 LAB — FACTOR 5 LEIDEN

## 2018-07-19 LAB — ANTITHROMBIN PANEL
ANTITHROMB III FUNC: 81 % (ref 75–135)
AT III AG PPP IMM-ACNC: 88 % (ref 72–124)

## 2018-07-29 DIAGNOSIS — I2510 Atherosclerotic heart disease of native coronary artery without angina pectoris: Secondary | ICD-10-CM | POA: Diagnosis not present

## 2018-07-29 DIAGNOSIS — G894 Chronic pain syndrome: Secondary | ICD-10-CM | POA: Diagnosis not present

## 2018-07-29 DIAGNOSIS — M47816 Spondylosis without myelopathy or radiculopathy, lumbar region: Secondary | ICD-10-CM | POA: Diagnosis not present

## 2018-07-29 DIAGNOSIS — E7800 Pure hypercholesterolemia, unspecified: Secondary | ICD-10-CM | POA: Diagnosis not present

## 2018-07-29 DIAGNOSIS — N182 Chronic kidney disease, stage 2 (mild): Secondary | ICD-10-CM | POA: Diagnosis not present

## 2018-07-29 DIAGNOSIS — M2240 Chondromalacia patellae, unspecified knee: Secondary | ICD-10-CM | POA: Diagnosis not present

## 2018-07-29 DIAGNOSIS — M170 Bilateral primary osteoarthritis of knee: Secondary | ICD-10-CM | POA: Diagnosis not present

## 2018-08-10 ENCOUNTER — Telehealth: Payer: Self-pay | Admitting: Cardiovascular Disease

## 2018-08-10 NOTE — Telephone Encounter (Signed)
Returned pt call. Requested labs form pcp. Let pt know once her labs are reviewed by Dr. Purvis Sheffield we will let her know about medication adjustments. Pt voiced understanding.

## 2018-08-10 NOTE — Telephone Encounter (Signed)
Patient called stating that she had recent labs from PCP  She is wanting to know if she can go back on the 5 mg of Crestor vs 10 mg.

## 2018-08-10 NOTE — Telephone Encounter (Signed)
Requested.

## 2018-08-10 NOTE — Telephone Encounter (Signed)
The only lipids I see are from May 2019. Would need more recent labs.

## 2018-08-16 ENCOUNTER — Encounter: Payer: Self-pay | Admitting: Cardiovascular Disease

## 2018-08-18 ENCOUNTER — Other Ambulatory Visit: Payer: Self-pay | Admitting: Cardiovascular Disease

## 2018-08-31 ENCOUNTER — Telehealth: Payer: Self-pay | Admitting: Cardiovascular Disease

## 2018-08-31 NOTE — Telephone Encounter (Signed)
Patient states she is to have tooth extracted and needs to know how long to be off of ASA and Plavix prior to / tg

## 2018-08-31 NOTE — Telephone Encounter (Signed)
Can hold Plavix 5 days prior. No need to stop ASA.

## 2018-08-31 NOTE — Telephone Encounter (Signed)
Upper left molar needs extracted

## 2018-08-31 NOTE — Telephone Encounter (Signed)
Pt.notified

## 2018-09-02 ENCOUNTER — Telehealth: Payer: Self-pay | Admitting: Cardiovascular Disease

## 2018-09-02 NOTE — Telephone Encounter (Signed)
Needs to know when she can start back Plavix from tooth extraction  Also, would like to know when she needs to stop Plavix for Gall Baldder surgery that has not been scheduled yet

## 2018-09-02 NOTE — Telephone Encounter (Signed)
Pt will restart plavix day after extraction as long as there is no gross bleeding

## 2018-09-21 ENCOUNTER — Ambulatory Visit (INDEPENDENT_AMBULATORY_CARE_PROVIDER_SITE_OTHER): Payer: PPO | Admitting: Gynecology

## 2018-09-21 ENCOUNTER — Other Ambulatory Visit: Payer: Self-pay

## 2018-09-21 ENCOUNTER — Encounter: Payer: Self-pay | Admitting: Gynecology

## 2018-09-21 VITALS — BP 118/78

## 2018-09-21 DIAGNOSIS — N644 Mastodynia: Secondary | ICD-10-CM | POA: Diagnosis not present

## 2018-09-21 NOTE — Progress Notes (Signed)
    Kayla [name] April 08, 1941 [id number]        78 y.o.  [name] presents with a several week history of left breast bruising.  Also notes some left breast tenderness in this area.  She does not remember trauma to her breast.  She does bruise easily and is on Plavix.  She was unsure whether she felt something in the area on self breast exam.  She googled her symptoms and was concerned that she had breast cancer.  Last mammogram September 2019.  Past medical history,surgical history, problem list, medications, allergies, family history and social history were all reviewed and documented in the EPIC chart.  Directed ROS with pertinent positives and negatives documented in the history of present illness/assessment and plan.  Exam: Kennon Portela assistant Vitals:   09/21/18 1135  BP: 118/78   General appearance:  Normal Both breasts examined lying and sitting.  Right without masses, retractions, discharge, adenopathy.  Left with area of bruising from 3:00 to 6:00.  No underlying masses, retractions, discharge or adenopathy.  Assessment/Plan:  78 y.o. [name] with 2 weeks of left breast bruising.  She does bruise easily but she feels these bruises are lasting longer and she is concerned about breast cancer.  Exam today is normal.  Recommended diagnostic mammography of the left breast for completeness and patient reassurance.  Assuming negative she will continue with self breast exams and as long as the bruising resolves and she palpates no abnormalities then she will follow.     Dara Lords MD, 11:54 AM 09/21/2018

## 2018-09-21 NOTE — Patient Instructions (Signed)
[facility] will call you to arrange for the mammogram.  If you do not hear from them within several days call my office.

## 2018-09-23 ENCOUNTER — Telehealth: Payer: Self-pay | Admitting: *Deleted

## 2018-09-23 NOTE — Telephone Encounter (Signed)
Patient informed. 

## 2018-09-23 NOTE — Telephone Encounter (Signed)
-----   Message from Dara Lords, MD sent at 09/21/2018 11:57 AM EDT ----- Diagnostic mammography, ultrasound at radiologist discretion at [facility] reference new onset left breast bruising 3-6 o'clock with patient tenderness.  No overt trauma as cause.  No palpable abnormalities on physician exam.  Patient very concerned about breast cancer possibilities

## 2018-09-23 NOTE — Telephone Encounter (Signed)
Patient scheduled on 10/14/18 @ 9:00am as [facility], order faxed, left message for pt to call.

## 2018-10-14 ENCOUNTER — Encounter: Payer: Self-pay | Admitting: Gynecology

## 2018-10-14 DIAGNOSIS — S2002XA Contusion of left breast, initial encounter: Secondary | ICD-10-CM | POA: Diagnosis not present

## 2018-11-22 ENCOUNTER — Encounter: Payer: Self-pay | Admitting: Cardiovascular Disease

## 2018-11-22 ENCOUNTER — Ambulatory Visit (INDEPENDENT_AMBULATORY_CARE_PROVIDER_SITE_OTHER): Payer: PPO | Admitting: Cardiovascular Disease

## 2018-11-22 ENCOUNTER — Other Ambulatory Visit: Payer: Self-pay

## 2018-11-22 VITALS — BP 132/68 | HR 59 | Ht 68.0 in | Wt 146.2 lb

## 2018-11-22 DIAGNOSIS — I38 Endocarditis, valve unspecified: Secondary | ICD-10-CM

## 2018-11-22 DIAGNOSIS — E785 Hyperlipidemia, unspecified: Secondary | ICD-10-CM | POA: Diagnosis not present

## 2018-11-22 DIAGNOSIS — I25118 Atherosclerotic heart disease of native coronary artery with other forms of angina pectoris: Secondary | ICD-10-CM

## 2018-11-22 DIAGNOSIS — I10 Essential (primary) hypertension: Secondary | ICD-10-CM | POA: Diagnosis not present

## 2018-11-22 NOTE — Progress Notes (Signed)
SUBJECTIVE: The patient presents for routine follow-up. She reportedly has a history of a non-Q wave myocardial infarction dating back to 2003. She apparently underwent cardiac catheterization but did not require percutaneous coronary intervention. She has a history of hypercholesterolemia. She has been a widow since 2012/11/07 when her husband died of liver cancer.  I reviewed her coronary angiography report from 01/13/2002 which demonstrated a large first diagonal branch with a proximal 95% stenosis. 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  She avoids taking nitroglycerin due to headaches.  Echocardiogram 04/23/17 demonstrated normal left ventricular systolic function and regional wall motion, LVEF 60-65%, mild LVH, grade 1 diastolic dysfunction, mild to moderate mitral regurgitation, and moderate tricuspid regurgitation.  Nuclear stress test on 04/27/17 was low risk. There is a brief 7 beat run of atrial tachycardia during recovery. There were no perfusion abnormalities.  The patient denies any symptoms of chest pain, palpitations, shortness of breath, lightheadedness, dizziness, leg swelling, orthopnea, PND, and syncope.  She is scheduled for cholecystectomy next week.  She misses going to the [facility]  She has been somewhat complacent.  She has a son and a daughter who live in [place] and [place].  They both retired this past year.    Review of Systems: As per "subjective", otherwise negative.  Allergies  Allergen Reactions  . Codeine Nausea Only  . Lipitor [Atorvastatin Calcium] Other (See Comments)    Muscle aches  . Simvastatin Other (See Comments)    Muscle aches    Current Outpatient Medications  Medication Sig Dispense Refill  . Ascorbic Acid (VITA-C PO) Take 1,000 mg by mouth 3 (three) times daily.     Marland Kitchen aspirin 81 MG tablet Take 81 mg by mouth daily.      . Calcium-Vitamin D (POSTURE-D PO) Take 1  tablet by mouth 2 (two) times daily.     . Cholecalciferol (VITAMIN D) 1000 UNITS capsule Take 1,000 Units by mouth daily.     . clopidogrel (PLAVIX) 75 MG tablet TAKE (1) TABLET BY MOUTH ONCE DAILY. (Patient taking differently: Take 75 mg by mouth daily. ) 90 tablet 3  . Coenzyme Q10 (COQ10) 100 MG CAPS Take 100 mg by mouth daily.     . Methylcellulose, Laxative, (CITRUCEL PO) Take 1 packet by mouth at bedtime.     . metoprolol tartrate (LOPRESSOR) 50 MG tablet TAKE (1/2) TABLET BY MOUTH TWICE DAILY. (Patient taking differently: Take 25 mg by mouth 2 (two) times daily. ) 90 tablet 1  . nitroGLYCERIN (NITROSTAT) 0.4 MG SL tablet Place 1 tablet (0.4 mg total) under the tongue every 5 (five) minutes as needed for chest pain. 25 tablet 3  . Omega-3 Fatty Acids (FISH OIL) 1200 MG CAPS Take 1,200 mg by mouth daily.    Marland Kitchen OVER THE COUNTER MEDICATION Take 1 tablet by mouth 2 (two) times a day. Drenamin    . OVER THE COUNTER MEDICATION Take 1 tablet by mouth 3 (three) times daily. Cholacol    . Probiotic Product (ALIGN) 4 MG CAPS Take 4 mg by mouth daily.    . rosuvastatin (CRESTOR) 10 MG tablet Take 1 tablet (10 mg total) by mouth daily. 90 tablet 3   No current facility-administered medications for this visit.     Past Medical History:  Diagnosis Date  . Hyperlipidemia   . Hypertension   . Ischemic heart disease   . Myocardial infarction ([place]) 2003  .  Osteopenia 04/2015   T score -1.3. Stable from prior DEXA    Past Surgical History:  Procedure Laterality Date  . ABDOMINAL HYSTERECTOMY  1984   TAH bleeding adenomyosis  . BREAST SURGERY     Breast bx    Social History   Socioeconomic History  . Marital status: Widowed    Spouse name: Not on file  . Number of children: Not on file  . Years of education: Not on file  . Highest education level: Not on file  Occupational History  . Not on file  Social Needs  . Financial resource strain: Not on file  . Food insecurity    Worry: Not  on file    Inability: Not on file  . Transportation needs    Medical: Not on file    Non-medical: Not on file  Tobacco Use  . Smoking status: Never Smoker  . Smokeless tobacco: Never Used  Substance and Sexual Activity  . Alcohol use: No    Alcohol/week: 0.0 standard drinks  . Drug use: No  . Sexual activity: Never    Birth control/protection: Surgical    Comment: 1st intercourse 78 yo-Fewer than 5 partners  Lifestyle  . Physical activity    Days per week: Not on file    Minutes per session: Not on file  . Stress: Not on file  Relationships  . Social Musicianconnections    Talks on phone: Not on file    Gets together: Not on file    Attends religious service: Not on file    Active member of club or organization: Not on file    Attends meetings of clubs or organizations: Not on file    Relationship status: Not on file  . Intimate partner violence    Fear of current or ex partner: Not on file    Emotionally abused: Not on file    Physically abused: Not on file    Forced sexual activity: Not on file  Other Topics Concern  . Not on file  Social History Narrative  . Not on file     Vitals:   11/22/18 1250  BP: 132/68  Pulse: (!) 59  SpO2: 100%  Weight: 146 lb 3.2 oz (66.3 kg)  Height: 5\' 8"  (1.727 m)    Wt Readings from Last 3 Encounters:  11/22/18 146 lb 3.2 oz (66.3 kg)  05/18/18 171 lb (77.6 kg)  04/12/18 176 lb 12.8 oz (80.2 kg)     PHYSICAL EXAM General: NAD HEENT: Normal. Neck: No JVD, no thyromegaly. Lungs: Clear to auscultation bilaterally with normal respiratory effort. CV: Regular rate and rhythm, normal S1/S2, no S3/S4, no murmur. No pretibial or periankle edema.  No carotid bruit.   Abdomen: Soft, nontender, no distention.  Neurologic: Alert and oriented.  Psych: Normal affect. Skin: Normal. Musculoskeletal: No gross deformities.    ECG: Reviewed above under Subjective   Labs: Lab Results  Component Value Date/Time   K 4.3 03/12/2015 08:40 AM    BUN 24 03/12/2015 08:40 AM   CREATININE 0.95 (H) 03/12/2015 08:40 AM   ALT 16 03/12/2015 08:40 AM     Lipids: Lab Results  Component Value Date/Time   LDLCALC 82 03/12/2015 08:40 AM   CHOL 169 03/12/2015 08:40 AM   TRIG 49 03/12/2015 08:40 AM   HDL 77 03/12/2015 08:40 AM       ASSESSMENT AND PLAN: 1. 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 Echocardiogram and nuclear stress test reviewed above with  normal left ventricular systolic function and no evidence of ischemia. Continue aspirin, Plavix, metoprolol, and statin. We previously had a lengthy discussion regarding both antiplatelet and statin therapy. Given that she has done so well all these years free of symptoms and without bleeding problems, I think it is reasonable to continue Plavix.However, it may be more reasonable to stop Plavix and start rivaroxaban2.5 mg twice daily.  2. Hyperlipidemia: LDL 69 on 07/29/2018.  Triglycerides, total cholesterol, and HDL were normal. Continue Crestor 10 mg daily.  I told her to please inform me if she were to develop increased myalgias and/or joint pains and before stopping or reducing the dose of Crestor.  3. Valvular heart disease: Echocardiogram reviewed above with both mitral and tricuspid regurgitation. I will continue to monitor with clinical and echocardiographic surveillance.  4. Hypertension: BP is normal.  No changes to therapy.     Disposition: Follow up 6 months   Prentice DockerSuresh Verdun Rackley, M.D., F.A.C.C.

## 2018-11-22 NOTE — Patient Instructions (Signed)

## 2018-11-23 ENCOUNTER — Other Ambulatory Visit: Payer: Self-pay

## 2018-11-23 ENCOUNTER — Encounter (HOSPITAL_COMMUNITY)
Admission: RE | Admit: 2018-11-23 | Discharge: 2018-11-23 | Disposition: A | Discharge status: Home / Self Care | Payer: PPO | Source: Ambulatory Visit | Attending: Surgery | Admitting: Surgery

## 2018-11-23 ENCOUNTER — Encounter (HOSPITAL_COMMUNITY): Payer: Self-pay

## 2018-11-23 DIAGNOSIS — Z20828 Contact with and (suspected) exposure to other viral communicable diseases: Secondary | ICD-10-CM | POA: Insufficient documentation

## 2018-11-23 DIAGNOSIS — Z01812 Encounter for preprocedural laboratory examination: Secondary | ICD-10-CM | POA: Diagnosis not present

## 2018-11-23 LAB — BASIC METABOLIC PANEL
Anion gap: 9 (ref 5–15)
BUN: 14 mg/dL (ref 8–23)
CO2: 27 mmol/L (ref 22–32)
Calcium: 9.5 mg/dL (ref 8.9–10.3)
Chloride: 103 mmol/L (ref 98–111)
Creatinine, Ser: 0.89 mg/dL (ref 0.44–1.00)
GFR calc Af Amer: >60 mL/min (ref >60)
GFR calc non Af Amer: >60 mL/min (ref >60)
Glucose, Bld: 96 mg/dL (ref 70–99)
Potassium: 4.1 mmol/L (ref 3.5–5.1)
Sodium: 139 mmol/L (ref 135–145)

## 2018-11-23 LAB — CBC
HCT: 41.4 % (ref 36.0–46.0)
Hemoglobin: 13.3 g/dL (ref 12.0–15.0)
MCH: 32.4 pg (ref 26.0–34.0)
MCHC: 32.1 g/dL (ref 30.0–36.0)
MCV: 100.7 fL — ABNORMAL HIGH (ref 80.0–100.0)
Platelets: 194 10*3/uL (ref 150–400)
RBC: 4.11 MIL/uL (ref 3.87–5.11)
RDW: 12.5 % (ref 11.5–15.5)
WBC: 5.3 10*3/uL (ref 4.0–10.5)
nRBC: 0 % (ref 0.0–0.2)

## 2018-11-23 NOTE — Progress Notes (Signed)
lov cardiology Dr Bronson Ing 11-22-2018 epic   Stress test 2018 epic   Echo 2018 epci   ekg 04-12-18 epic

## 2018-11-23 NOTE — Patient Instructions (Addendum)
YOU NEED TO HAVE A COVID 19 TEST ON_____Thursday July 30th @___9 :10 am____, THIS TEST MUST BE DONE BEFORE SURGERY, COME  [address], [place] , [id number]. ONCE YOUR COVID TEST IS COMPLETED, PLEASE BEGIN THE QUARANTINE INSTRUCTIONS AS OUTLINED IN YOUR HANDOUT.                Kayla [name]   Your procedure is scheduled on: 11-29-2018   [facility] Main  Entr[facility]     Report to admitting at 12:00PM   1 VISITOR IS ALLOWED TO WAIT IN WAITING ROOM  ONLY DAY OF YOUR SURGERY.    Call this number if you have problems the morning of surgery [phone]    Remember: YOU MAY HAVE CLEAR LIQUIDS FROM MIDNIGHT UNTIL 8:00AM. NOTHING BY MOUTH AFTER 8:00AM!  BRUSH YOUR TEETH MORNING OF SURGERY AND RINSE YOUR MOUTH OUT, NO CHEWING GUM CANDY OR MINTS.     CLEAR LIQUID DIET   Foods Allowed                                                                     Foods Excluded  Coffee and tea, regular and decaf                             liquids that you cannot  Plain Jell-O any favor except red or purple                                           see through such as: Fruit ices (not with fruit pulp)                                     milk, soups, orange juice  Iced Popsicles                                    All solid food Carbonated beverages, regular and diet                                    Cranberry, grape and apple juices Sports drinks like Gatorade Lightly seasoned clear broth or consume(fat free) Sugar, honey syrup  Sample Menu Breakfast                                Lunch                                     Supper Cranberry juice                    Beef broth  Chicken broth Jell-O                                     Grape juice                           Apple juice Coffee or tea                        Jell-O                                      Popsicle                                                Coffee or tea                         Coffee or tea  _____________________________________________________________________       Take these medicines the morning of surgery with A SIP OF WATER: metoprolol, rosuvastatin                                 You may not have any metal on your body including hair pins and              piercings  Do not wear jewelry, make-up, lotions, powders or perfumes, deodorant             Do not wear nail polish.  Do not shave  48 hours prior to surgery.                Do not bring valuables to the hospital. [place].  Contacts, dentures or bridgework may not be worn into surgery.      Patients discharged the day of surgery will not be allowed to drive home. IF YOU ARE HAVING SURGERY AND GOING HOME THE SAME DAY, YOU MUST HAVE AN ADULT TO DRIVE YOU HOME AND BE WITH YOU FOR 24 HOURS. YOU MAY GO HOME BY TAXI OR UBER OR ORTHERWISE, BUT AN ADULT MUST ACCOMPANY YOU HOME AND STAY WITH YOU FOR 24 HOURS.  Name and phone number of your driver:  Special Instructions: N/A              Please read over the following fact sheets you were given: _____________________________________________________________________             [facility] - Preparing for Surgery Before surgery, you can play an important role.  Because skin is not sterile, your skin needs to be as free of germs as possible.  You can reduce the number of germs on your skin by washing with CHG (chlorahexidine gluconate) soap before surgery.  CHG is an antiseptic cleaner which kills germs and bonds with the skin to continue killing germs even after washing. Please DO NOT use if you have an allergy to CHG or antibacterial soaps.  If your skin becomes reddened/irritated stop using the CHG and inform your  nurse when you arrive at Short Stay. Do not shave (including legs and underarms) for at least 48 hours prior to the first CHG shower.  You may shave your face/neck. Please follow these  instructions carefully:  1.  Shower with CHG Soap the night before surgery and the  morning of Surgery.  2.  If you choose to wash your hair, wash your hair first as usual with your  normal  shampoo.  3.  After you shampoo, rinse your hair and body thoroughly to remove the  shampoo.                           4.  Use CHG as you would any other liquid soap.  You can apply chg directly  to the skin and wash                       Gently with a scrungie or clean washcloth.  5.  Apply the CHG Soap to your body ONLY FROM THE NECK DOWN.   Do not use on face/ open                           Wound or open sores. Avoid contact with eyes, ears mouth and genitals (private parts).                       Wash face,  Genitals (private parts) with your normal soap.             6.  Wash thoroughly, paying special attention to the area where your surgery  will be performed.  7.  Thoroughly rinse your body with warm water from the neck down.  8.  DO NOT shower/wash with your normal soap after using and rinsing off  the CHG Soap.                9.  Pat yourself dry with a clean towel.            10.  Wear clean pajamas.            11.  Place clean sheets on your bed the night of your first shower and do not  sleep with pets. Day of Surgery : Do not apply any lotions/deodorants the morning of surgery.  Please wear clean clothes to the hospital/surgery center.  FAILURE TO FOLLOW THESE INSTRUCTIONS MAY RESULT IN THE CANCELLATION OF YOUR SURGERY PATIENT SIGNATURE_________________________________  NURSE SIGNATURE__________________________________  ________________________________________________________________________

## 2018-11-25 ENCOUNTER — Other Ambulatory Visit (HOSPITAL_COMMUNITY)
Admission: RE | Admit: 2018-11-25 | Discharge: 2018-11-25 | Disposition: A | Discharge status: Home / Self Care | Payer: PPO | Source: Ambulatory Visit | Attending: Surgery | Admitting: Surgery

## 2018-11-25 DIAGNOSIS — Z01812 Encounter for preprocedural laboratory examination: Secondary | ICD-10-CM | POA: Diagnosis not present

## 2018-11-25 LAB — SARS CORONAVIRUS 2 (TAT 6-24 HRS): SARS Coronavirus 2: NEGATIVE

## 2018-11-25 NOTE — Progress Notes (Signed)
Anesthesia Chart Review   Case: [id number] Date/Time: 11/29/18 1345   Procedure: LAPAROSCOPIC CHOLECYSTECTOMY WITH INTRAOPERATIVE CHOLANGIOGRAM (N/A )   Anesthesia type: General   Pre-op diagnosis: CHRONIC CHOLECYSTITIS   Location: [facility] / [facility]   Surgeon: Johnathan Hausen, MD      DISCUSSION:78 y.o. never smoker with h/o MI 2003, HLD, HTN, mild to moderate mitral regurgitation, moderate TR (monitored by cardiologist), chronic cholecystitis scheduled for above procedure 11/29/2018 with Dr. Johnathan Hausen.   Pt last seen by cardiologist, Dr. Kate Sable, 11/22/2018.  Nuclear stress test on 04/27/17 was low risk.  Symptomatically stable at this visit. Dr. Bronson Ing aware of upcoming procedure.   Anticipate pt can proceed with planned procedure barring acute status change.   VS: BP 136/61   Pulse 64   Resp 16   Ht 5' 8.5" (1.74 m)   Wt 64 kg   SpO2 100%   BMI 21.13 kg/m   PROVIDERS: Jilda Panda, MD is PCP   Kate Sable, MD is Cardiologist  LABS: Labs reviewed: Acceptable for surgery. (all labs ordered are listed, but only abnormal results are displayed)  Labs Reviewed  CBC - Abnormal; Notable for the following components:      Result Value   MCV 100.7 (*)    All other components within normal limits  BASIC METABOLIC PANEL     IMAGES:   EKG: 04/12/18 Rate 60 bpm Normal sinus rhythm   CV: Echo 04/23/17 Study Conclusions  - Left ventricle: The cavity size was normal. Wall thickness was   increased in a pattern of mild LVH. Systolic function was normal.   The estimated ejection fraction was in the range of 60% to 65%.   Wall motion was normal; there were no regional wall motion   abnormalities. Doppler parameters are consistent with abnormal   left ventricular relaxation (grade 1 diastolic dysfunction). - Aortic valve: Trileaflet; mildly thickened, mildly calcified   leaflets. - Mitral valve: Mildly calcified annulus. Mildly thickened leaflets  . There was mild to moderate regurgitation. - Right atrium: The atrium was mildly dilated. - Tricuspid valve: There was moderate regurgitation. Past Medical History:  Diagnosis Date  . Hyperlipidemia   . Hypertension   . Ischemic heart disease   . Myocardial infarction ([place]) 2003  . Osteopenia 04/2015   T score -1.3. Stable from prior DEXA    Past Surgical History:  Procedure Laterality Date  . ABDOMINAL HYSTERECTOMY  1984   TAH bleeding adenomyosis  . BREAST SURGERY     Breast bx  . CATARACT EXTRACTION W/ INTRAOCULAR LENS IMPLANT Bilateral 11/2017    MEDICATIONS: . Ascorbic Acid (VITA-C PO)  . aspirin 81 MG tablet  . Calcium-Vitamin D (POSTURE-D PO)  . Cholecalciferol (VITAMIN D) 1000 UNITS capsule  . clopidogrel (PLAVIX) 75 MG tablet  . Coenzyme Q10 (COQ10) 100 MG CAPS  . Methylcellulose, Laxative, (CITRUCEL PO)  . metoprolol tartrate (LOPRESSOR) 50 MG tablet  . nitroGLYCERIN (NITROSTAT) 0.4 MG SL tablet  . Omega-3 Fatty Acids (FISH OIL) 1200 MG CAPS  . OVER THE COUNTER MEDICATION  . Probiotic Product (ALIGN) 4 MG CAPS  . rosuvastatin (CRESTOR) 10 MG tablet   No current facility-administered medications for this encounter.     Maia Plan [facility] Pre-Surgical Testing [phone] 11/25/18  11:14 AM

## 2018-11-25 NOTE — Anesthesia Preprocedure Evaluation (Addendum)
Anesthesia Evaluation  Patient identified by MRN, date of birth, ID band Patient awake    Reviewed: Allergy & Precautions, NPO status , Patient's Chart, lab work & pertinent test results  Airway Mallampati: II  TM Distance: >3 FB Neck ROM: Full    Dental no notable dental hx.    Pulmonary neg pulmonary ROS,    Pulmonary exam normal breath sounds clear to auscultation       Cardiovascular hypertension, + CAD and + Past MI  + Valvular Problems/Murmurs MR  Rhythm:Regular Rate:Normal + Systolic murmurs EF 54%   Neuro/Psych negative neurological ROS  negative psych ROS   GI/Hepatic negative GI ROS, Neg liver ROS,   Endo/Other  negative endocrine ROS  Renal/GU negative Renal ROS  negative genitourinary   Musculoskeletal negative musculoskeletal ROS (+)   Abdominal   Peds negative pediatric ROS (+)  Hematology negative hematology ROS (+)   Anesthesia Other Findings   Reproductive/Obstetrics negative OB ROS                           Anesthesia Physical Anesthesia Plan  ASA: III  Anesthesia Plan: General   Post-op Pain Management:    Induction: Intravenous and Rapid sequence  PONV Risk Score and Plan: 2 and Ondansetron, Dexamethasone and Treatment may vary due to age or medical condition  Airway Management Planned: Oral ETT  Additional Equipment:   Intra-op Plan:   Post-operative Plan: Extubation in OR  Informed Consent: I have reviewed the patients History and Physical, chart, labs and discussed the procedure including the risks, benefits and alternatives for the proposed anesthesia with the patient or authorized representative who has indicated his/her understanding and acceptance.     Dental advisory given  Plan Discussed with: CRNA and Surgeon  Anesthesia Plan Comments: (See PAT note 11/23/2018, Konrad Felix, PA-C)       Anesthesia Quick Evaluation

## 2018-11-26 ENCOUNTER — Ambulatory Visit (HOSPITAL_COMMUNITY): Admission: RE | Admit: 2018-11-26 | Payer: PPO | Source: Home / Self Care | Admitting: Surgery

## 2018-11-26 ENCOUNTER — Encounter (HOSPITAL_COMMUNITY): Admission: RE | Payer: Self-pay | Source: Home / Self Care

## 2018-11-26 SURGERY — LAPAROSCOPIC CHOLECYSTECTOMY WITH INTRAOPERATIVE CHOLANGIOGRAM
Anesthesia: General

## 2018-11-28 NOTE — H&P (Addendum)
Kayla [name] Location: [facility] Patient #: [id number] DOB: Jul 20, 1940 Widowed / Language: English / Race: White Female   History of Present Illness The patient is a 78 year old female who presents for evaluation of gall stones. She is referred by Dr. Cristina Gong with small stones/sludge and recent multiday attack of RUQ pain. Ultrasound showed the stones/sludge. She had an MI ~14 years ago and is followed by Dr. Jacinta Shoe in [place] and takes generic Plavix. I discussed lap chole with her in detail and gave her a [facility] booklet. She wants to have this done at [facility]. She is aware of the risks not limited to CBD injury, bleeding and bile leaks. Scheduling delayed related to Covid.  She has been off her Plavix and aspirin since last Tuesday.   Past Surgical History Malachi Bonds, CMA; 07/08/2018 11:18 AM) Breast Biopsy  Right. Hysterectomy (not due to cancer) - Partial   Diagnostic Studies History Malachi Bonds, CMA; 07/08/2018 11:18 AM) Colonoscopy  5-10 years ago Mammogram  within last year  Allergies Malachi Bonds, CMA; 07/08/2018 11:19 AM) Codeine Sulfate *ANALGESICS - OPIOID*  Lipitor *ANTIHYPERLIPIDEMICS*   Medication History (Chemira Jones, CMA; 07/08/2018 11:21 AM) Aspirin (81MG  Tablet, Oral) Active. Vitamin D (1000UNIT Tablet, Oral) Active. Clopidogrel Bisulfate (75MG  Tablet, Oral) Active. Co Q-10 (100MG  Capsule, Oral) Active. Metoprolol Tartrate (50MG  Tablet, Oral) Active. Nitroglycerin (0.4MG  Tab Sublingual, Sublingual) Active. Omega 3 (1000MG  Capsule, Oral) Active. Rosuvastatin Calcium (20MG  Tablet, Oral) Active. Medications Reconciled  Social History Malachi Bonds, CMA; 07/08/2018 11:18 AM) Caffeine use  Carbonated beverages, Coffee, Tea. No alcohol use  No drug use  Tobacco use  Never smoker.  Family History Malachi Bonds, CMA; 07/08/2018 11:18 AM) Arthritis  Mother. Heart Disease  Family Members In General,  Father. Hypertension  Mother. Migraine Headache  Daughter. Respiratory Condition  Father. Thyroid problems  Mother.  Pregnancy / Birth History Malachi Bonds, CMA; 07/08/2018 11:18 AM) Age at menarche  52 years. Gravida  2 Maternal age  65-25 Para  2  Other Problems Malachi Bonds, CMA; 07/08/2018 11:18 AM) Cholelithiasis  Congestive Heart Failure  Pulmonary Embolism / Blood Clot in Legs     Review of Systems (Chemira Jones CMA; 07/08/2018 11:18 AM) Skin Present- Dryness. Not Present- Change in Wart/Mole, Hives, Jaundice, New Lesions, Non-Healing Wounds, Rash and Ulcer. HEENT Present- Wears glasses/contact lenses. Not Present- Earache, Hearing Loss, Hoarseness, Nose Bleed, Oral Ulcers, Ringing in the Ears, Seasonal Allergies, Sinus Pain, Sore Throat, Visual Disturbances and Yellow Eyes. Respiratory Not Present- Bloody sputum, Chronic Cough, Difficulty Breathing, Snoring and Wheezing. Cardiovascular Not Present- Chest Pain, Difficulty Breathing Lying Down, Leg Cramps, Palpitations, Rapid Heart Rate, Shortness of Breath and Swelling of Extremities. Gastrointestinal Not Present- Abdominal Pain, Bloating, Bloody Stool, Change in Bowel Habits, Chronic diarrhea, Constipation, Difficulty Swallowing, Excessive gas, Gets full quickly at meals, Hemorrhoids, Indigestion, Nausea, Rectal Pain and Vomiting. Musculoskeletal Present- Joint Pain. Not Present- Back Pain, Joint Stiffness, Muscle Pain, Muscle Weakness and Swelling of Extremities. Neurological Present- Headaches. Not Present- Decreased Memory, Fainting, Numbness, Seizures, Tingling, Tremor, Trouble walking and Weakness. Psychiatric Not Present- Anxiety, Bipolar, Change in Sleep Pattern, Depression, Fearful and Frequent crying. Endocrine Not Present- Cold Intolerance, Excessive Hunger, Hair Changes, Heat Intolerance, Hot flashes and New Diabetes. Hematology Present- Blood Thinners. Not Present- Easy Bruising, Excessive  bleeding, Gland problems, HIV and Persistent Infections.  Vitals (Chemira Jones CMA; 07/08/2018 11:19 AM) 07/08/2018 11:18 AM Weight: 158.4 lb Height: 68in Body Surface Area: 1.85 m Body Mass Index: 24.08 kg/m  Pulse: 77 (  Regular)  BP: 130/80(Sitting, Left Arm, Standard)       Physical Exam (Corgan Mormile B. Daphine DeutscherMartin MD; 07/08/2018 11:40 AM) General Note: Well maintained WF NAD HEENT unremarkable Neck no bruits Chest clear Heart SR wtihout murmurs Abdomen not hurting at present Ext FROM     Assessment & Plan [place]li [place](Casidy Alberta B. Daphine DeutscherMartin MD; 07/08/2018 11:42 AM)  CHRONIC CHOLECYSTITIS (K81.1) Impression: [facility]. She will need to be off Plavix for 5 days preop. She has a history of blood clots in her family including her son. Will check clotting profile.

## 2018-11-29 ENCOUNTER — Observation Stay (HOSPITAL_COMMUNITY)
Admission: AD | Admit: 2018-11-29 | Discharge: 2018-11-30 | Disposition: A | Discharge status: Home / Self Care | Payer: PPO | Attending: Surgery | Admitting: Surgery

## 2018-11-29 ENCOUNTER — Encounter (HOSPITAL_COMMUNITY)
Admission: AD | Disposition: A | Discharge status: Home / Self Care | Payer: Self-pay | Source: Home / Self Care | Attending: Surgery

## 2018-11-29 ENCOUNTER — Ambulatory Visit (HOSPITAL_COMMUNITY): Payer: PPO | Admitting: Registered Nurse

## 2018-11-29 ENCOUNTER — Encounter (HOSPITAL_COMMUNITY): Payer: Self-pay | Admitting: *Deleted

## 2018-11-29 ENCOUNTER — Ambulatory Visit (HOSPITAL_COMMUNITY): Payer: PPO | Admitting: Physician Assistant

## 2018-11-29 ENCOUNTER — Ambulatory Visit (HOSPITAL_COMMUNITY): Payer: PPO

## 2018-11-29 ENCOUNTER — Other Ambulatory Visit: Payer: Self-pay

## 2018-11-29 ENCOUNTER — Telehealth (HOSPITAL_COMMUNITY): Payer: Self-pay | Admitting: *Deleted

## 2018-11-29 DIAGNOSIS — Z79899 Other long term (current) drug therapy: Secondary | ICD-10-CM | POA: Diagnosis not present

## 2018-11-29 DIAGNOSIS — I252 Old myocardial infarction: Secondary | ICD-10-CM | POA: Diagnosis not present

## 2018-11-29 DIAGNOSIS — I110 Hypertensive heart disease with heart failure: Secondary | ICD-10-CM | POA: Diagnosis not present

## 2018-11-29 DIAGNOSIS — K915 Postcholecystectomy syndrome: Secondary | ICD-10-CM | POA: Diagnosis not present

## 2018-11-29 DIAGNOSIS — Z7902 Long term (current) use of antithrombotics/antiplatelets: Secondary | ICD-10-CM | POA: Diagnosis not present

## 2018-11-29 DIAGNOSIS — Z7982 Long term (current) use of aspirin: Secondary | ICD-10-CM | POA: Diagnosis not present

## 2018-11-29 DIAGNOSIS — I119 Hypertensive heart disease without heart failure: Secondary | ICD-10-CM | POA: Diagnosis not present

## 2018-11-29 DIAGNOSIS — Z9049 Acquired absence of other specified parts of digestive tract: Secondary | ICD-10-CM

## 2018-11-29 DIAGNOSIS — K811 Chronic cholecystitis: Principal | ICD-10-CM | POA: Insufficient documentation

## 2018-11-29 DIAGNOSIS — I2510 Atherosclerotic heart disease of native coronary artery without angina pectoris: Secondary | ICD-10-CM | POA: Diagnosis not present

## 2018-11-29 DIAGNOSIS — I509 Heart failure, unspecified: Secondary | ICD-10-CM | POA: Diagnosis not present

## 2018-11-29 DIAGNOSIS — Z86711 Personal history of pulmonary embolism: Secondary | ICD-10-CM | POA: Insufficient documentation

## 2018-11-29 HISTORY — PX: CHOLECYSTECTOMY: SHX55

## 2018-11-29 LAB — CBC
HCT: 41.4 % (ref 36.0–46.0)
Hemoglobin: 13.8 g/dL (ref 12.0–15.0)
MCH: 33.2 pg (ref 26.0–34.0)
MCHC: 33.3 g/dL (ref 30.0–36.0)
MCV: 99.5 fL (ref 80.0–100.0)
Platelets: 175 10*3/uL (ref 150–400)
RBC: 4.16 MIL/uL (ref 3.87–5.11)
RDW: 12.2 % (ref 11.5–15.5)
WBC: 10.2 10*3/uL (ref 4.0–10.5)
nRBC: 0 % (ref 0.0–0.2)

## 2018-11-29 LAB — CREATININE, SERUM
Creatinine, Ser: 0.77 mg/dL (ref 0.44–1.00)
GFR calc Af Amer: >60 mL/min (ref >60)
GFR calc non Af Amer: >60 mL/min (ref >60)

## 2018-11-29 SURGERY — LAPAROSCOPIC CHOLECYSTECTOMY WITH INTRAOPERATIVE CHOLANGIOGRAM
Anesthesia: General | Site: Abdomen

## 2018-11-29 MED ORDER — HEPARIN SODIUM (PORCINE) 5000 UNIT/ML IJ SOLN
5000.0000 [IU] | Freq: Three times a day (TID) | INTRAMUSCULAR | Status: DC
  Administered 2018-11-29 – 2018-11-30 (×2): 5000 [IU] via SUBCUTANEOUS
  Filled 2018-11-29 (×2): qty 1

## 2018-11-29 MED ORDER — CHLORHEXIDINE GLUCONATE CLOTH 2 % EX PADS: 6.0000 | MEDICATED_PAD | Freq: Once | CUTANEOUS | Status: DC

## 2018-11-29 MED ORDER — FENTANYL CITRATE (PF) 100 MCG/2ML IJ SOLN
INTRAMUSCULAR | Status: AC
  Filled 2018-11-29: qty 2

## 2018-11-29 MED ORDER — ONDANSETRON 4 MG PO TBDP: 4.0000 mg | ORAL_TABLET | Freq: Four times a day (QID) | ORAL | Status: DC | PRN

## 2018-11-29 MED ORDER — ROCURONIUM BROMIDE 10 MG/ML (PF) SYRINGE
PREFILLED_SYRINGE | INTRAVENOUS | Status: DC | PRN
  Administered 2018-11-29: 40 mg via INTRAVENOUS

## 2018-11-29 MED ORDER — EPHEDRINE 5 MG/ML INJ
INTRAVENOUS | Status: AC
  Filled 2018-11-29: qty 10

## 2018-11-29 MED ORDER — BUPIVACAINE LIPOSOME 1.3 % IJ SUSP
20.0000 mL | Freq: Once | INTRAMUSCULAR | Status: DC
  Filled 2018-11-29: qty 20

## 2018-11-29 MED ORDER — PROPOFOL 10 MG/ML IV BOLUS
INTRAVENOUS | Status: DC | PRN
  Administered 2018-11-29: 100 mg via INTRAVENOUS

## 2018-11-29 MED ORDER — FENTANYL CITRATE (PF) 100 MCG/2ML IJ SOLN
INTRAMUSCULAR | Status: DC | PRN
  Administered 2018-11-29 (×2): 25 ug via INTRAVENOUS
  Administered 2018-11-29: 50 ug via INTRAVENOUS

## 2018-11-29 MED ORDER — HYDROCODONE-ACETAMINOPHEN 5-325 MG PO TABS: 1.0000 | ORAL_TABLET | ORAL | Status: DC | PRN

## 2018-11-29 MED ORDER — KCL IN DEXTROSE-NACL 20-5-0.45 MEQ/L-%-% IV SOLN
INTRAVENOUS | Status: DC
  Administered 2018-11-29: 16:00:00 via INTRAVENOUS
  Filled 2018-11-29 (×2): qty 1000

## 2018-11-29 MED ORDER — ACETAMINOPHEN 500 MG PO TABS
1000.0000 mg | ORAL_TABLET | ORAL | Status: AC
  Administered 2018-11-29: 12:00:00 1000 mg via ORAL
  Filled 2018-11-29: qty 2

## 2018-11-29 MED ORDER — FENTANYL CITRATE (PF) 100 MCG/2ML IJ SOLN
25.0000 ug | INTRAMUSCULAR | Status: DC | PRN
  Administered 2018-11-29 (×2): 50 ug via INTRAVENOUS

## 2018-11-29 MED ORDER — LIDOCAINE 2% (20 MG/ML) 5 ML SYRINGE
INTRAMUSCULAR | Status: DC | PRN
  Administered 2018-11-29: 60 mg via INTRAVENOUS

## 2018-11-29 MED ORDER — CEFAZOLIN SODIUM-DEXTROSE 2-4 GM/100ML-% IV SOLN
2.0000 g | INTRAVENOUS | Status: AC
  Administered 2018-11-29: 2 g via INTRAVENOUS
  Filled 2018-11-29: qty 100

## 2018-11-29 MED ORDER — METOPROLOL TARTRATE 5 MG/5ML IV SOLN: 5.0000 mg | Freq: Four times a day (QID) | INTRAVENOUS | Status: DC | PRN

## 2018-11-29 MED ORDER — METOPROLOL TARTRATE 25 MG PO TABS
25.0000 mg | ORAL_TABLET | Freq: Two times a day (BID) | ORAL | Status: DC
  Administered 2018-11-29 – 2018-11-30 (×2): 25 mg via ORAL
  Filled 2018-11-29 (×2): qty 1

## 2018-11-29 MED ORDER — HEPARIN SODIUM (PORCINE) 5000 UNIT/ML IJ SOLN
5000.0000 [IU] | Freq: Once | INTRAMUSCULAR | Status: AC
  Administered 2018-11-29: 5000 [IU] via SUBCUTANEOUS
  Filled 2018-11-29: qty 1

## 2018-11-29 MED ORDER — DEXAMETHASONE SODIUM PHOSPHATE 10 MG/ML IJ SOLN
INTRAMUSCULAR | Status: DC | PRN
  Administered 2018-11-29: 8 mg via INTRAVENOUS

## 2018-11-29 MED ORDER — PROPOFOL 10 MG/ML IV BOLUS
INTRAVENOUS | Status: AC
  Filled 2018-11-29: qty 20

## 2018-11-29 MED ORDER — PROMETHAZINE HCL 25 MG/ML IJ SOLN: 6.2500 mg | INTRAMUSCULAR | Status: DC | PRN

## 2018-11-29 MED ORDER — ONDANSETRON HCL 4 MG/2ML IJ SOLN
INTRAMUSCULAR | Status: DC | PRN
  Administered 2018-11-29: 4 mg via INTRAVENOUS

## 2018-11-29 MED ORDER — LACTATED RINGERS IV SOLN
INTRAVENOUS | Status: DC
  Administered 2018-11-29: 12:00:00 via INTRAVENOUS

## 2018-11-29 MED ORDER — LACTATED RINGERS IR SOLN
Status: DC | PRN
  Administered 2018-11-29: 1000 mL

## 2018-11-29 MED ORDER — TRAMADOL HCL 50 MG PO TABS
50.0000 mg | ORAL_TABLET | Freq: Four times a day (QID) | ORAL | Status: DC | PRN
  Administered 2018-11-29 – 2018-11-30 (×3): 50 mg via ORAL
  Filled 2018-11-29 (×3): qty 1

## 2018-11-29 MED ORDER — SUCCINYLCHOLINE CHLORIDE 200 MG/10ML IV SOSY
PREFILLED_SYRINGE | INTRAVENOUS | Status: DC | PRN
  Administered 2018-11-29: 100 mg via INTRAVENOUS

## 2018-11-29 MED ORDER — BUPIVACAINE LIPOSOME 1.3 % IJ SUSP
INTRAMUSCULAR | Status: DC | PRN
  Administered 2018-11-29: 20 mL

## 2018-11-29 MED ORDER — ONDANSETRON HCL 4 MG/2ML IJ SOLN: 4.0000 mg | Freq: Four times a day (QID) | INTRAMUSCULAR | Status: DC | PRN

## 2018-11-29 MED ORDER — KETOROLAC TROMETHAMINE 30 MG/ML IJ SOLN: 15.0000 mg | Freq: Once | INTRAMUSCULAR | Status: DC | PRN

## 2018-11-29 MED ORDER — 0.9 % SODIUM CHLORIDE (POUR BTL) OPTIME
TOPICAL | Status: DC | PRN
  Administered 2018-11-29: 1000 mL

## 2018-11-29 MED ORDER — IOHEXOL 300 MG/ML  SOLN
INTRAMUSCULAR | Status: DC | PRN
  Administered 2018-11-29: 2 mL

## 2018-11-29 MED ORDER — SUGAMMADEX SODIUM 200 MG/2ML IV SOLN
INTRAVENOUS | Status: AC
  Filled 2018-11-29: qty 2

## 2018-11-29 MED ORDER — EPHEDRINE SULFATE-NACL 50-0.9 MG/10ML-% IV SOSY
PREFILLED_SYRINGE | INTRAVENOUS | Status: DC | PRN
  Administered 2018-11-29 (×3): 5 mg via INTRAVENOUS

## 2018-11-29 MED ORDER — GABAPENTIN 300 MG PO CAPS
300.0000 mg | ORAL_CAPSULE | ORAL | Status: AC
  Administered 2018-11-29: 12:00:00 300 mg via ORAL
  Filled 2018-11-29: qty 1

## 2018-11-29 MED ORDER — NITROGLYCERIN 0.4 MG SL SUBL: 0.4000 mg | SUBLINGUAL_TABLET | SUBLINGUAL | Status: DC | PRN

## 2018-11-29 MED ORDER — SUGAMMADEX SODIUM 200 MG/2ML IV SOLN
INTRAVENOUS | Status: DC | PRN
  Administered 2018-11-29: 130 mg via INTRAVENOUS

## 2018-11-29 MED ORDER — FENTANYL CITRATE (PF) 100 MCG/2ML IJ SOLN: 12.5000 ug | INTRAMUSCULAR | Status: DC | PRN

## 2018-11-29 MED ORDER — ONDANSETRON HCL 4 MG/2ML IJ SOLN
INTRAMUSCULAR | Status: AC
  Filled 2018-11-29: qty 2

## 2018-11-29 SURGICAL SUPPLY — 44 items
ADH SKN CLS APL DERMABOND .7 (GAUZE/BANDAGES/DRESSINGS) ×1
APL SKNCLS STERI-STRIP NONHPOA (GAUZE/BANDAGES/DRESSINGS)
APL SWBSTK 6 STRL LF DISP (MISCELLANEOUS) ×2
APPLICATOR COTTON TIP 6 STRL (MISCELLANEOUS) ×2 IMPLANT
APPLICATOR COTTON TIP 6IN STRL (MISCELLANEOUS) ×6
APPLIER CLIP ROT 10 11.4 M/L (STAPLE) ×3
APR CLP MED LRG 11.4X10 (STAPLE) ×1
BAG SPEC RTRVL 10 TROC 200 (ENDOMECHANICALS)
BENZOIN TINCTURE PRP APPL 2/3 (GAUZE/BANDAGES/DRESSINGS) IMPLANT
CABLE HIGH FREQUENCY MONO STRZ (ELECTRODE) ×3 IMPLANT
CATH REDDICK CHOLANGI 4FR 50CM (CATHETERS) ×3 IMPLANT
CLIP APPLIE ROT 10 11.4 M/L (STAPLE) ×1 IMPLANT
CLOSURE WOUND 1/2 X4 (GAUZE/BANDAGES/DRESSINGS)
COVER MAYO STAND STRL (DRAPES) ×3 IMPLANT
COVER SURGICAL LIGHT HANDLE (MISCELLANEOUS) ×3 IMPLANT
COVER WAND RF STERILE (DRAPES) IMPLANT
DECANTER SPIKE VIAL GLASS SM (MISCELLANEOUS) ×3 IMPLANT
DERMABOND ADVANCED (GAUZE/BANDAGES/DRESSINGS) ×2
DERMABOND ADVANCED .7 DNX12 (GAUZE/BANDAGES/DRESSINGS) ×1 IMPLANT
DRAPE C-ARM 42X120 X-RAY (DRAPES) ×3 IMPLANT
ELECT PENCIL ROCKER SW 15FT (MISCELLANEOUS) IMPLANT
ELECT REM PT RETURN 15FT ADLT (MISCELLANEOUS) ×3 IMPLANT
GLOVE BIOGEL M 8.0 STRL (GLOVE) ×3 IMPLANT
GOWN STRL REUS W/TWL XL LVL3 (GOWN DISPOSABLE) ×9 IMPLANT
HEMOSTAT SURGICEL 4X8 (HEMOSTASIS) IMPLANT
IV CATH 14GX2 1/4 (CATHETERS) ×3 IMPLANT
KIT BASIN OR (CUSTOM PROCEDURE TRAY) ×3 IMPLANT
KIT TURNOVER KIT A (KITS) IMPLANT
L-HOOK LAP DISP 36CM (ELECTROSURGICAL)
LHOOK LAP DISP 36CM (ELECTROSURGICAL) IMPLANT
POUCH RETRIEVAL ECOSAC 10 (ENDOMECHANICALS) IMPLANT
POUCH RETRIEVAL ECOSAC 10MM (ENDOMECHANICALS)
SCISSORS LAP 5X45 EPIX DISP (ENDOMECHANICALS) ×3 IMPLANT
SET IRRIG TUBING LAPAROSCOPIC (IRRIGATION / IRRIGATOR) ×3 IMPLANT
SET TUBE SMOKE EVAC HIGH FLOW (TUBING) ×3 IMPLANT
SLEEVE XCEL OPT CAN 5 100 (ENDOMECHANICALS) ×3 IMPLANT
STRIP CLOSURE SKIN 1/2X4 (GAUZE/BANDAGES/DRESSINGS) IMPLANT
SUT MNCRL AB 4-0 PS2 18 (SUTURE) ×3 IMPLANT
SYR 20ML LL LF (SYRINGE) ×3 IMPLANT
TOWEL OR 17X26 10 PK STRL BLUE (TOWEL DISPOSABLE) ×3 IMPLANT
TRAY LAPAROSCOPIC (CUSTOM PROCEDURE TRAY) ×3 IMPLANT
TROCAR BLADELESS OPT 5 100 (ENDOMECHANICALS) ×3 IMPLANT
TROCAR XCEL BLUNT TIP 100MML (ENDOMECHANICALS) IMPLANT
TROCAR XCEL NON-BLD 11X100MML (ENDOMECHANICALS) ×3 IMPLANT

## 2018-11-29 NOTE — Anesthesia Postprocedure Evaluation (Signed)
Anesthesia Post Note  Patient: Kayla [name]  Procedure(s) Performed: LAPAROSCOPIC CHOLECYSTECTOMY WITH INTRAOPERATIVE CHOLANGIOGRAM (N/A Abdomen)     Patient location during evaluation: PACU Anesthesia Type: General Level of consciousness: awake and alert Pain management: pain level controlled Vital Signs Assessment: post-procedure vital signs reviewed and stable Respiratory status: spontaneous breathing, nonlabored ventilation, respiratory function stable and patient connected to nasal cannula oxygen Cardiovascular status: blood pressure returned to baseline and stable Postop Assessment: no apparent nausea or vomiting Anesthetic complications: no    Last Vitals:  Vitals:   11/29/18 1500 11/29/18 1515  BP: 134/70 129/71  Pulse: 81 68  Resp: 18 18  Temp:  (!) 35.6 C  SpO2: 100% 100%    Last Pain:  Vitals:   11/29/18 1500  TempSrc:   PainSc: 2                  Chanel Mcadams S

## 2018-11-29 NOTE — Op Note (Signed)
Kayla [name]  Primary Care Physician:  Jilda Panda, MD    11/29/2018  1:31 PM  Procedure: Laparoscopic Cholecystectomy with intraoperative cholangiogram  Surgeon: Catalina Antigua B. Hassell Done, MD, FACS Asst:  Romana Juniper, MD, FACS  Anes:  General  Drains:  None  Findings: Chronic cholecystitis with normal intraoperative cholangiogram  Description of Procedure: The patient was taken to OR 1 and given general anesthesia.  The patient was prepped with PCMX and draped sterilely. A time out was performed.  Access to the abdomen was achieved with a 5 mm Optiview through a right upper quadrant approach.  Port placement included three 5 mm ports and one 11 mm port in the upper midline.    The gallbladder was visualized and the fundus was grasped and the gallbladder was elevated. Traction on the infundibulum allowed for successful demonstration of the critical view. Inflammatory changes were chronic and the infundibulum was adherent to the duodenum.  The cystic duct was identified and clipped up on the gallbladder and an incision was made in the cystic duct and the Reddick catheter was inserted after milking the cystic duct of any debris. A dynamic cholangiogram was performed which demonstrated intrahepatic filling and free flow into the duodenum.    The cystic duct was then triple clipped and divided, the cystic artery was double clipped and divided and then the gallbladder was removed from the gallbladder bed. Removal of the gallbladder from the gallbladder bed was performed without entering it.  The gallbladder was then placed in a bag and brought out through one of the trocar sites. The gallbladder bed was inspected and no bleeding or bile leaks were seen.   General inspection of the abdomen was not remarkable.   Incisions were injected with Exparel by way of a TAP block with 20 cc of Exparel and closed with 4-0 Monocryl and Dermabond on the skin.  Sponge and needle count were correct.    The patient was  taken to the recovery room in satisfactory condition.

## 2018-11-29 NOTE — Transfer of Care (Signed)
Immediate Anesthesia Transfer of Care Note  Patient: Kayla [name]  Procedure(s) Performed: LAPAROSCOPIC CHOLECYSTECTOMY WITH INTRAOPERATIVE CHOLANGIOGRAM (N/A Abdomen)  Patient Location: PACU  Anesthesia Type:General  Level of Consciousness: awake, alert , oriented and patient cooperative  Airway & Oxygen Therapy: Patient Spontanous Breathing and Patient connected to face mask oxygen  Post-op Assessment: Report given to RN, Post -op Vital signs reviewed and stable and Patient moving all extremities  Post vital signs: Reviewed and stable  Last Vitals:  Vitals Value Taken Time  BP 132/68 11/29/18 1338  Temp    Pulse 89 11/29/18 1339  Resp 16 11/29/18 1339  SpO2 100 % 11/29/18 1339  Vitals shown include unvalidated device data.  Last Pain:  Vitals:   11/29/18 1137  TempSrc: Oral      Patients Stated Pain Goal: 4 (54/65/68 1275)  Complications: No apparent anesthesia complications

## 2018-11-29 NOTE — Anesthesia Procedure Notes (Signed)
Procedure Name: Intubation Date/Time: 11/29/2018 12:32 PM Performed by: Victoriano Lain, CRNA Pre-anesthesia Checklist: Patient identified, Emergency Drugs available, Suction available, Patient being monitored and Timeout performed Patient Re-evaluated:Patient Re-evaluated prior to induction Oxygen Delivery Method: Circle system utilized Preoxygenation: Pre-oxygenation with 100% oxygen Induction Type: IV induction, Rapid sequence and Cricoid Pressure applied Laryngoscope Size: Mac and 3 Grade View: Grade I Tube type: Oral Tube size: 7.0 mm Number of attempts: 1 Airway Equipment and Method: Stylet Placement Confirmation: ETT inserted through vocal cords under direct vision,  positive ETCO2 and breath sounds checked- equal and bilateral Secured at: 21 cm Tube secured with: Tape Dental Injury: Teeth and Oropharynx as per pre-operative assessment

## 2018-11-30 ENCOUNTER — Encounter (HOSPITAL_COMMUNITY): Payer: Self-pay | Admitting: Surgery

## 2018-11-30 DIAGNOSIS — K811 Chronic cholecystitis: Secondary | ICD-10-CM | POA: Diagnosis not present

## 2018-11-30 MED ORDER — TRAMADOL HCL 50 MG PO TABS: 50.0000 mg | ORAL_TABLET | Freq: Four times a day (QID) | ORAL | 0 refills | Status: DC | PRN

## 2018-11-30 NOTE — Discharge Summary (Signed)
Physician Discharge Summary  Patient ID: Kayla [name] MRN: [id number] DOB/AGE: 1940-10-08 78 y.o.  PCP: Jilda Panda, MD  Admit date: 11/29/2018 Discharge date: 11/30/2018  Admission Diagnoses:  Chronic cholecystitis  Discharge Diagnoses:  same  Principal Problem:   S/P laparoscopic cholecystectomyAugust2020   Surgery:  Laparoscopic cholecystectomy  Discharged Condition: improved  Hospital Course:   Had surgery Monday afternoon and kept overnight for observation;  Ready to go home on Tuesday.    Consults: none  Significant Diagnostic Studies: none    Discharge Exam: Blood pressure (!) 119/56, pulse 63, temperature 98.1 F (36.7 C), temperature source Oral, resp. rate 18, height 5' 8.5" (1.74 m), weight 64 kg, SpO2 98 %. Incisions ok.   Disposition: Discharge disposition: 01-Home or Self Care       Discharge Instructions    Diet - low sodium heart healthy   Complete by: As directed    Increase activity slowly   Complete by: As directed      Allergies as of 11/30/2018      Reactions   Codeine Nausea Only   Lipitor [atorvastatin Calcium] Other (See Comments)   Muscle aches   Simvastatin Other (See Comments)   Muscle aches      Medication List    STOP taking these medications   OVER THE COUNTER MEDICATION   OVER THE COUNTER MEDICATION     TAKE these medications   Align 4 MG Caps Take 4 mg by mouth daily.   aspirin 81 MG tablet Take 81 mg by mouth daily.   CITRUCEL PO Take 1 packet by mouth at bedtime.   clopidogrel 75 MG tablet Commonly known as: PLAVIX TAKE (1) TABLET BY MOUTH ONCE DAILY. What changed: See the new instructions.   CoQ10 100 MG Caps Take 100 mg by mouth daily.   Fish Oil 1200 MG Caps Take 1,200 mg by mouth daily.   metoprolol tartrate 50 MG tablet Commonly known as: LOPRESSOR TAKE (1/2) TABLET BY MOUTH TWICE DAILY. What changed: See the new instructions.   nitroGLYCERIN 0.4 MG SL tablet Commonly known as:  NITROSTAT Place 1 tablet (0.4 mg total) under the tongue every 5 (five) minutes as needed for chest pain.   POSTURE-D PO Take 1 tablet by mouth 2 (two) times daily.   rosuvastatin 10 MG tablet Commonly known as: CRESTOR Take 1 tablet (10 mg total) by mouth daily.   traMADol 50 MG tablet Commonly known as: ULTRAM Take 1 tablet (50 mg total) by mouth every 6 (six) hours as needed for moderate pain (mild pain).   VITA-C PO Take 1,000 mg by mouth 3 (three) times daily.   Vitamin D 1000 units capsule Take 1,000 Units by mouth daily.        Signed: Pedro Earls 11/30/2018, 7:39 AM

## 2018-11-30 NOTE — Discharge Instructions (Signed)

## 2018-11-30 NOTE — Progress Notes (Signed)
Discharge instructions given to patient. Patient had no questions. NT or writer will wheel patient out once family comes to pick her up  

## 2018-12-16 DIAGNOSIS — E7800 Pure hypercholesterolemia, unspecified: Secondary | ICD-10-CM | POA: Diagnosis not present

## 2018-12-16 DIAGNOSIS — Z6822 Body mass index (BMI) 22.0-22.9, adult: Secondary | ICD-10-CM | POA: Diagnosis not present

## 2018-12-16 DIAGNOSIS — I2510 Atherosclerotic heart disease of native coronary artery without angina pectoris: Secondary | ICD-10-CM | POA: Diagnosis not present

## 2018-12-16 DIAGNOSIS — N182 Chronic kidney disease, stage 2 (mild): Secondary | ICD-10-CM | POA: Diagnosis not present

## 2018-12-16 DIAGNOSIS — Z0000 Encounter for general adult medical examination without abnormal findings: Secondary | ICD-10-CM | POA: Diagnosis not present

## 2019-01-27 DIAGNOSIS — Z23 Encounter for immunization: Secondary | ICD-10-CM | POA: Diagnosis not present

## 2019-02-03 ENCOUNTER — Encounter: Payer: Self-pay | Admitting: Gynecology

## 2019-02-21 ENCOUNTER — Other Ambulatory Visit: Payer: Self-pay | Admitting: Cardiovascular Disease

## 2019-02-23 ENCOUNTER — Encounter: Payer: Self-pay | Admitting: Gynecology

## 2019-02-23 DIAGNOSIS — Z1231 Encounter for screening mammogram for malignant neoplasm of breast: Secondary | ICD-10-CM | POA: Diagnosis not present

## 2019-02-23 DIAGNOSIS — Z90710 Acquired absence of both cervix and uterus: Secondary | ICD-10-CM | POA: Diagnosis not present

## 2019-02-23 DIAGNOSIS — Z803 Family history of malignant neoplasm of breast: Secondary | ICD-10-CM | POA: Diagnosis not present

## 2019-02-23 DIAGNOSIS — Z8262 Family history of osteoporosis: Secondary | ICD-10-CM | POA: Diagnosis not present

## 2019-02-23 DIAGNOSIS — R29890 Loss of height: Secondary | ICD-10-CM | POA: Diagnosis not present

## 2019-02-23 DIAGNOSIS — M85852 Other specified disorders of bone density and structure, left thigh: Secondary | ICD-10-CM | POA: Diagnosis not present

## 2019-02-24 ENCOUNTER — Encounter: Payer: Self-pay | Admitting: Gynecology

## 2019-03-16 ENCOUNTER — Encounter: Payer: Self-pay | Admitting: Gynecology

## 2019-03-16 ENCOUNTER — Ambulatory Visit: Payer: PPO | Admitting: Gynecology

## 2019-03-16 ENCOUNTER — Other Ambulatory Visit: Payer: Self-pay

## 2019-03-16 VITALS — BP 124/78

## 2019-03-16 DIAGNOSIS — N898 Other specified noninflammatory disorders of vagina: Secondary | ICD-10-CM

## 2019-03-16 DIAGNOSIS — R300 Dysuria: Secondary | ICD-10-CM | POA: Diagnosis not present

## 2019-03-16 LAB — WET PREP FOR TRICH, YEAST, CLUE

## 2019-03-16 MED ORDER — NITROFURANTOIN MONOHYD MACRO 100 MG PO CAPS: 100.0000 mg | ORAL_CAPSULE | Freq: Two times a day (BID) | ORAL | 0 refills | Status: DC

## 2019-03-16 NOTE — Patient Instructions (Signed)
Take the prescribed antibiotic twice daily for 7 days.  Follow-up if your symptoms persist, worsen or recur. 

## 2019-03-16 NOTE — Progress Notes (Signed)
    Kayla [name] November 10, 1940 [id number]        78 y.o.  [name] presents with several days of dysuria with suprapubic discomfort.  Also noticed a little bit of blood tinge in her underwear but not in the toilet bowl.  No frequency urgency fever chills low back pain.  No vaginal discharge or odor.  Status post TVH for DUB and adenomyosis.  Past medical history,surgical history, problem list, medications, allergies, family history and social history were all reviewed and documented in the EPIC chart.  Directed ROS with pertinent positives and negatives documented in the history of present illness/assessment and plan.  Exam: [name] assistant Vitals:   03/16/19 1225  BP: 124/78   General appearance:  Normal Spine straight without CVA tenderness Abdomen soft nontender without mass guarding rebound Pelvic external BUS vagina with atrophic changes.  Slight white discharge noted.  No evidence of bleeding.  Bimanual without masses or tenderness  Assessment/Plan:  78 y.o. [id number] with history and exam as above.  Urine analysis consistent with UTI.  I think this accounts for some blood tingeing in her underwear due to a hemorrhagic cystitis.  No evidence of vaginal bleeding.  Will treat with Macrobid 100 mg twice daily x7 days.  We will follow-up if her symptoms persist or recur.    Anastasio Auerbach MD, 12:36 PM 03/16/2019

## 2019-03-18 LAB — URINALYSIS, COMPLETE W/RFL CULTURE
Bilirubin Urine: NEGATIVE
Glucose, UA: NEGATIVE
Hyaline Cast: NONE SEEN /LPF
Ketones, ur: NEGATIVE
Nitrites, Initial: NEGATIVE
Protein, ur: NEGATIVE
Specific Gravity, Urine: 1.01 (ref 1.001–1.03)
pH: 5.5 (ref 5.0–8.0)

## 2019-03-18 LAB — URINE CULTURE
MICRO NUMBER:: 1114571
Result:: NO GROWTH
SPECIMEN QUALITY:: ADEQUATE

## 2019-03-18 LAB — CULTURE INDICATED

## 2019-03-24 IMAGING — US US ABDOMEN COMPLETE
1 series · 13 of 25 positions shown · non-contrast
Comparison: 05/26/2017

CLINICAL DATA: Epigastric pain radiating around to RIGHT upper
quadrant and periumbilical abdominal pain off and on for 2 weeks,
history of MI, hypertension, ischemic heart disease

EXAM:
ABDOMEN ULTRASOUND COMPLETE

[Series 1: us abdomen complete · 0.20mm/px · 13 of 97 slices shown]
[im 1/97]
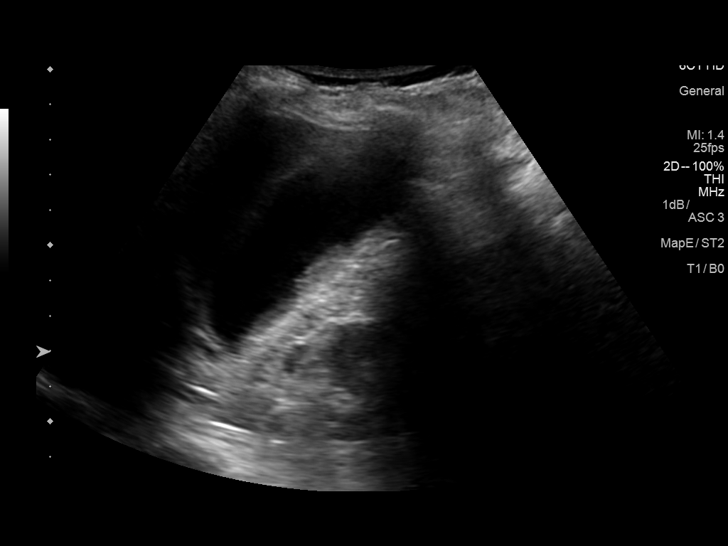
[im 9/97]
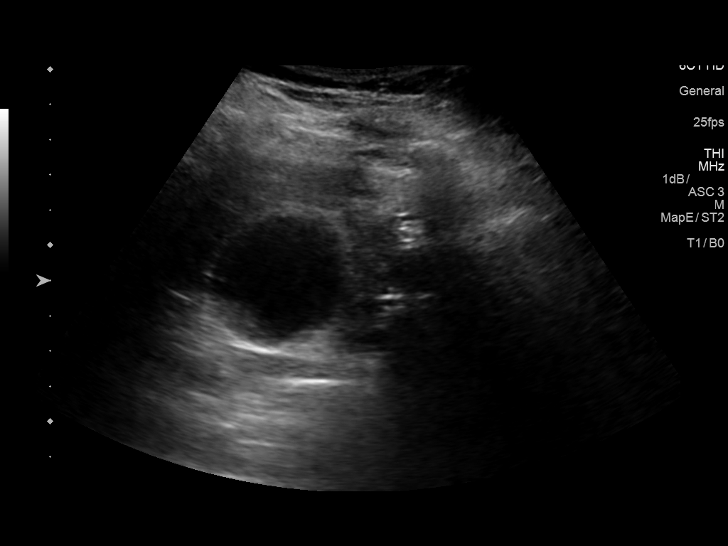
[im 17/97]
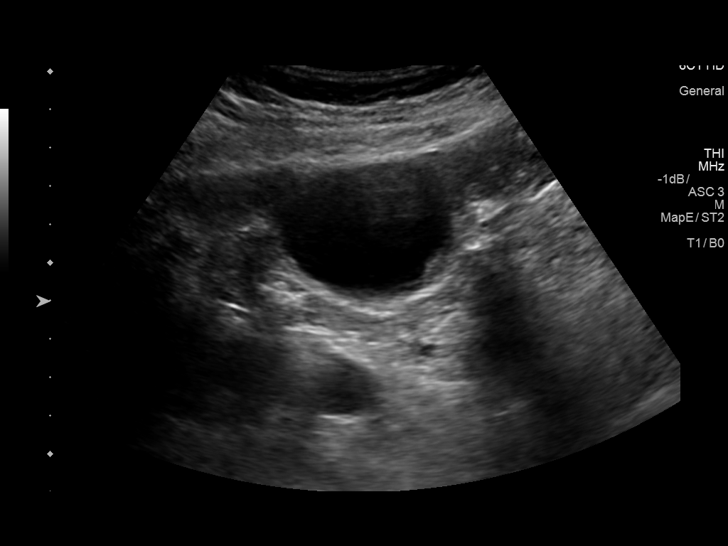
[im 25/97]
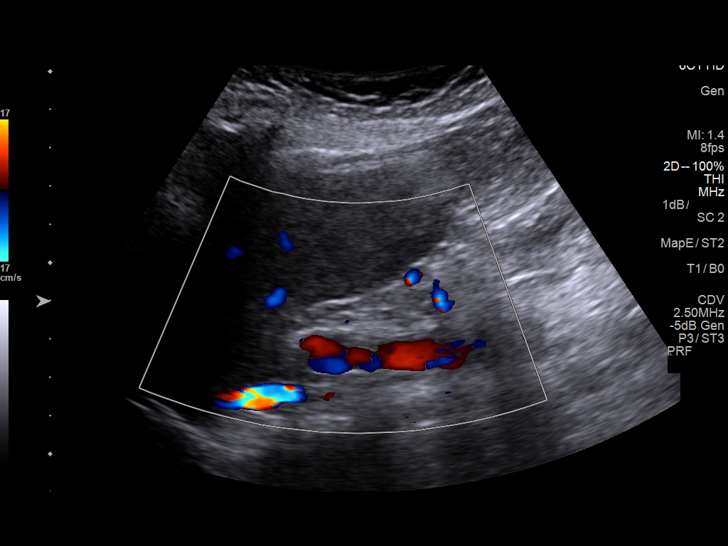
[im 33/97]
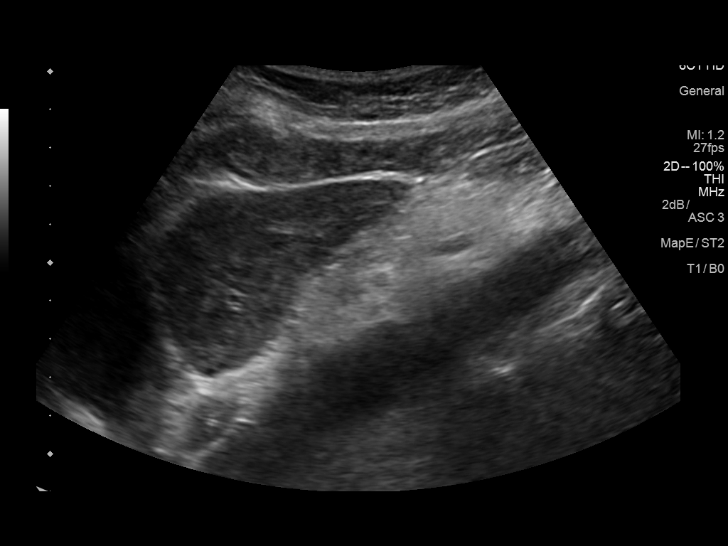
[im 41/97]
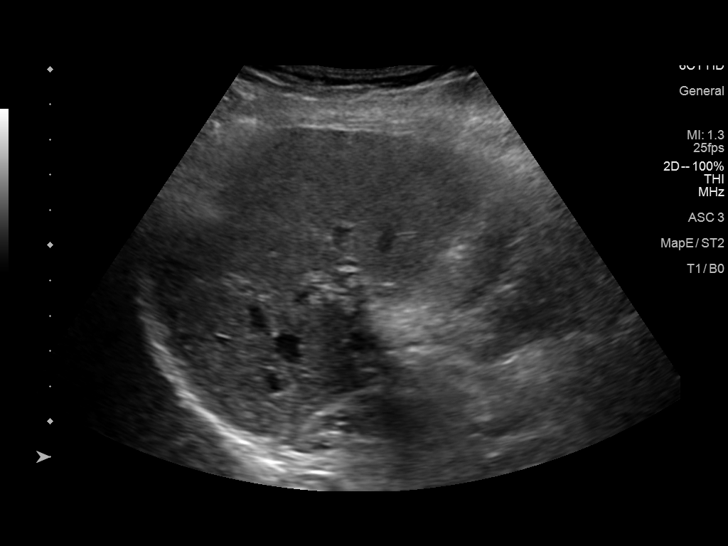
[im 49/97]
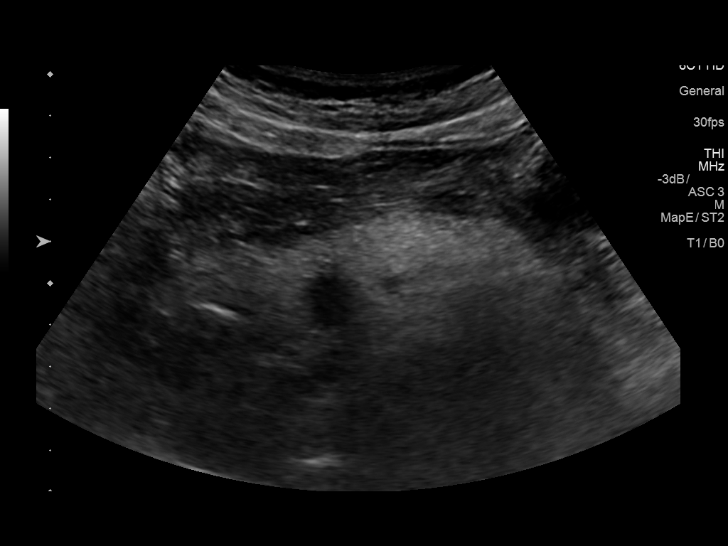
[im 57/97]
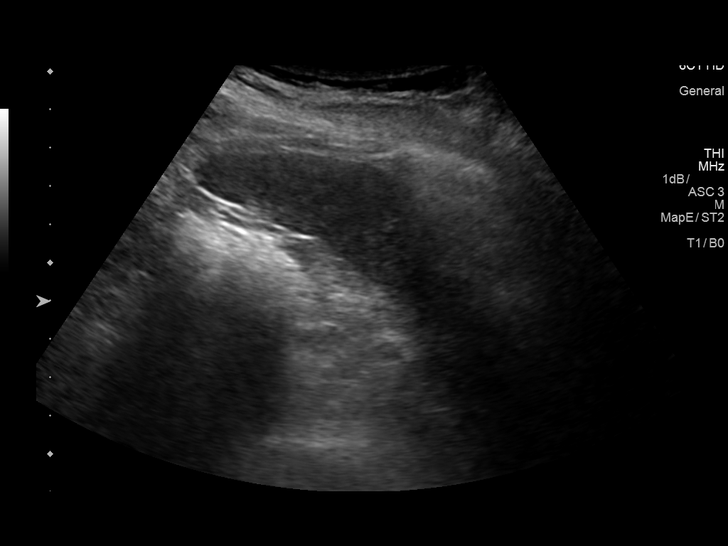
[im 65/97]
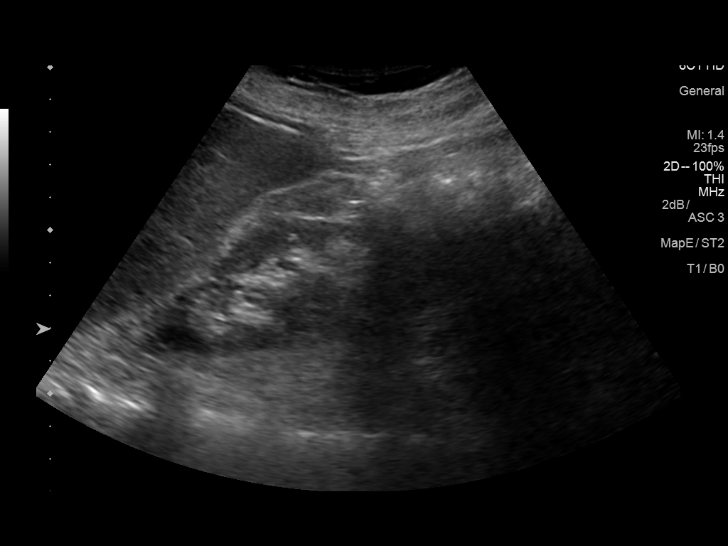
[im 73/97]
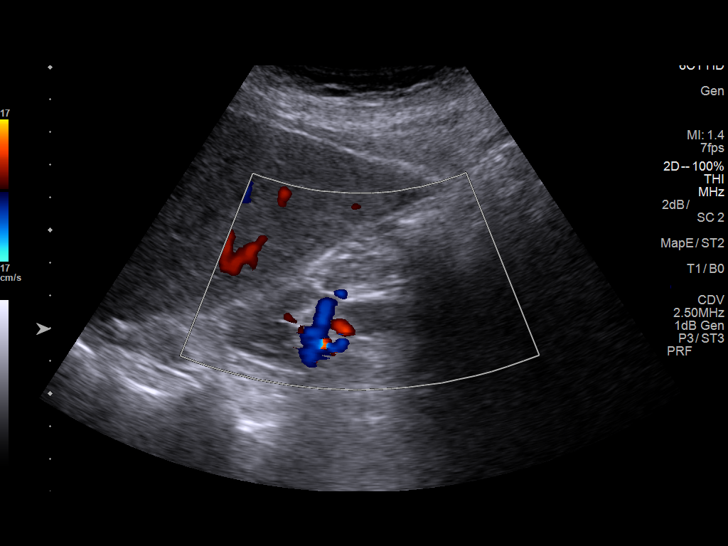
[im 81/97]
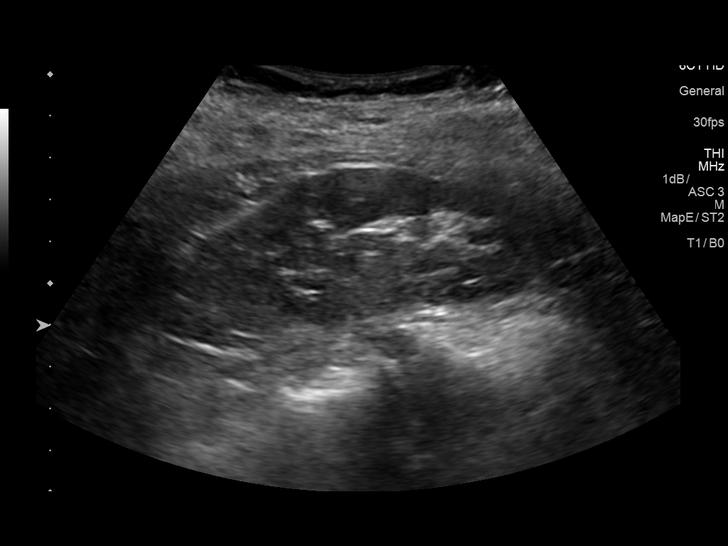
[im 89/97]
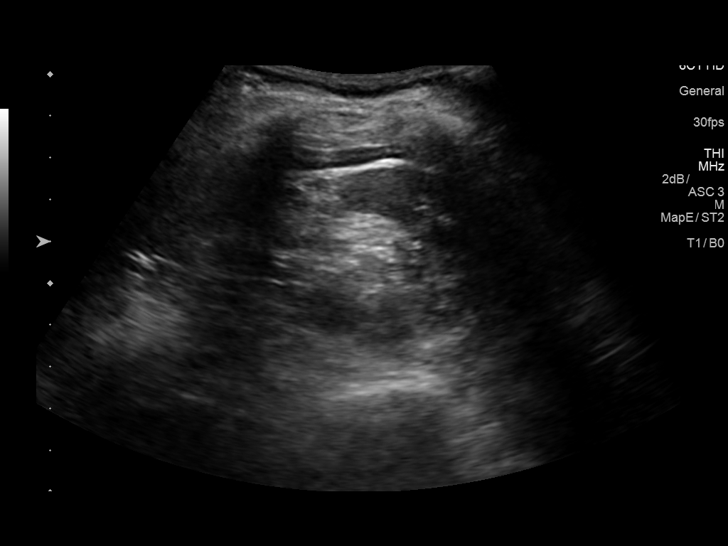
[im 97/97]
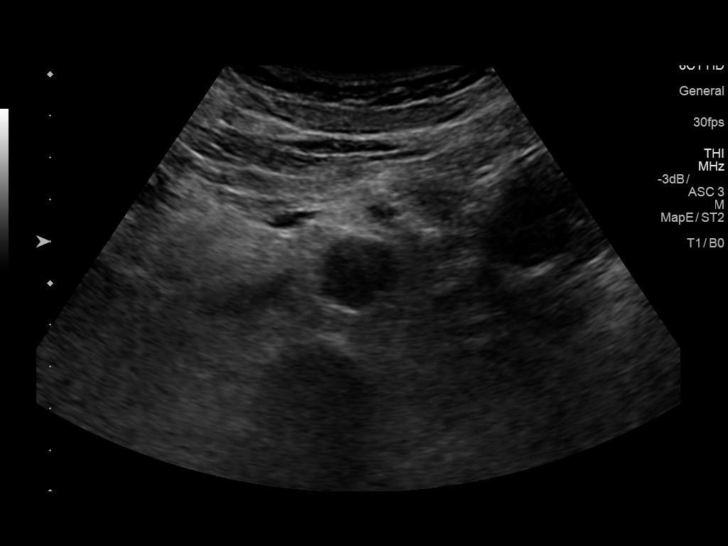

[13 of 25 positions shown; findings below may reference images not displayed]

FINDINGS: Gallbladder: Normally distended. Probable mild dependent sludge.
Tiny echogenic focus 4 mm diameter likely reflecting a small
nonshadowing calculus. No gallbladder wall thickening,
pericholecystic fluid or sonographic Murphy sign.

Common bile duct: Diameter: 5 mm diameter, normal

Liver: Normal appearance without mass or nodularity. Portal vein is
patent on color Doppler imaging with normal direction of blood flow
towards the liver.

IVC: Normal appearance

Pancreas: Suboptimally visualized due to bowel gas, no gross
abnormality seen within visualized portions.

Spleen: Normal appearance, 7.4 cm length

Right Kidney: Length: 11.5 cm. Cortical thinning. Normal cortical
echogenicity. No mass identified. Minimally prominent extrarenal
pelvis.

Left Kidney: Length: 10.1 cm. Normal morphology without mass or
hydronephrosis

Abdominal aorta: Normal caliber

Other findings: No free fluid
IMPRESSION: Probable small amount of sludge and tiny nonobstructing calculus
within gallbladder.

Incomplete pancreatic visualization.

Remainder of exam unremarkable.

## 2019-03-30 ENCOUNTER — Encounter: Payer: Self-pay | Admitting: Gynecology

## 2019-03-30 ENCOUNTER — Ambulatory Visit: Payer: PPO | Admitting: Gynecology

## 2019-03-30 ENCOUNTER — Other Ambulatory Visit: Payer: Self-pay

## 2019-03-30 VITALS — BP 134/80

## 2019-03-30 DIAGNOSIS — N898 Other specified noninflammatory disorders of vagina: Secondary | ICD-10-CM | POA: Diagnosis not present

## 2019-03-30 DIAGNOSIS — R300 Dysuria: Secondary | ICD-10-CM

## 2019-03-30 LAB — WET PREP FOR TRICH, YEAST, CLUE

## 2019-03-30 MED ORDER — FLUCONAZOLE 150 MG PO TABS: 150.0000 mg | ORAL_TABLET | Freq: Every day | ORAL | 0 refills | Status: DC

## 2019-03-30 MED ORDER — METRONIDAZOLE 0.75 % VA GEL: 1.0000 | Freq: Every day | VAGINAL | 0 refills | Status: DC

## 2019-03-30 NOTE — Progress Notes (Signed)
    Kayla [name] March 21, 1941 [id number]        78 y.o.  [name] presents having been recently treated 2 weeks ago for suprapubic discomfort, dysuria and some blood-tinged discharge.  Status post TVH for DU B and adenomyosis in the past.  Was diagnosed with UTI and placed on Macrobid 100 mg twice daily x7 days.  Her urine culture ultimately did not grow out bacteria.  Her symptoms seem to improve with no further discharge or blood but still with some suprapubic discomfort and vaginal burning  Past medical history,surgical history, problem list, medications, allergies, family history and social history were all reviewed and documented in the EPIC chart.  Directed ROS with pertinent positives and negatives documented in the history of present illness/assessment and plan.  Exam: Kayla [name] assistant Vitals:   03/30/19 1016  BP: 134/80   General appearance:  Normal Abdomen soft nontender without mass guarding rebound Pelvic external BUS vagina with atrophic changes.  Clumpy white discharge noted.  Bimanual without masses or tenderness.  Assessment/Plan:  78 y.o. [id number] with history and exam as above.  Urine analysis appears contaminated.  Will await culture results.  Wet prep does show yeast and bacterial vaginosis.  Will treat with Diflucan 150 mg daily x2 doses.  Hold cholesterol medication during this time.  Also with MetroGel nightly x1 week.  She will follow-up if her symptoms persist, worsen or recur.    Kayla Auerbach MD, 10:30 AM 03/30/2019

## 2019-03-30 NOTE — Patient Instructions (Signed)
Take the Diflucan pills daily for 2 doses.  Hold your cholesterol medication during this time.  Use the vaginal cream nightly for 1 week.  Follow-up if your symptoms persist, worsen or recur.

## 2019-04-01 LAB — URINALYSIS, COMPLETE W/RFL CULTURE
Bilirubin Urine: NEGATIVE
Glucose, UA: NEGATIVE
Hgb urine dipstick: NEGATIVE
Hyaline Cast: NONE SEEN /LPF
Ketones, ur: NEGATIVE
Nitrites, Initial: NEGATIVE
Protein, ur: NEGATIVE
RBC / HPF: NONE SEEN /HPF (ref 0–2)
Specific Gravity, Urine: 1.025 (ref 1.001–1.03)
pH: 5.5 (ref 5.0–8.0)

## 2019-04-01 LAB — URINE CULTURE
MICRO NUMBER:: 1155483
Result:: NO GROWTH
SPECIMEN QUALITY:: ADEQUATE

## 2019-04-01 LAB — CULTURE INDICATED

## 2019-04-12 DIAGNOSIS — N182 Chronic kidney disease, stage 2 (mild): Secondary | ICD-10-CM | POA: Diagnosis not present

## 2019-04-12 DIAGNOSIS — I2510 Atherosclerotic heart disease of native coronary artery without angina pectoris: Secondary | ICD-10-CM | POA: Diagnosis not present

## 2019-04-12 DIAGNOSIS — R350 Frequency of micturition: Secondary | ICD-10-CM | POA: Diagnosis not present

## 2019-04-12 DIAGNOSIS — E7800 Pure hypercholesterolemia, unspecified: Secondary | ICD-10-CM | POA: Diagnosis not present

## 2019-04-25 ENCOUNTER — Other Ambulatory Visit: Payer: Self-pay | Admitting: Cardiovascular Disease

## 2019-05-09 ENCOUNTER — Other Ambulatory Visit: Payer: Self-pay

## 2019-05-09 ENCOUNTER — Encounter: Payer: Self-pay | Admitting: Obstetrics & Gynecology

## 2019-05-09 ENCOUNTER — Ambulatory Visit: Payer: PPO | Admitting: Obstetrics & Gynecology

## 2019-05-09 VITALS — BP 130/88

## 2019-05-09 DIAGNOSIS — R300 Dysuria: Secondary | ICD-10-CM

## 2019-05-09 DIAGNOSIS — N9489 Other specified conditions associated with female genital organs and menstrual cycle: Secondary | ICD-10-CM | POA: Diagnosis not present

## 2019-05-09 LAB — WET PREP FOR TRICH, YEAST, CLUE

## 2019-05-09 MED ORDER — CIPROFLOXACIN HCL 500 MG PO TABS: 500.0000 mg | ORAL_TABLET | Freq: Two times a day (BID) | ORAL | 0 refills | Status: DC

## 2019-05-09 MED ORDER — CIPROFLOXACIN HCL 500 MG PO TABS: 500.0000 mg | ORAL_TABLET | Freq: Two times a day (BID) | ORAL | 0 refills | Status: AC

## 2019-05-09 NOTE — Patient Instructions (Signed)
1. Dysuria Abnormal U/A, very probable Acute Cystitis.  Will treat with Cipro 500 mg PO BID x 7 days.  No CI.  Usage reviewed.  Prescription sent to Pharmacy.  U. Culture pending. - Urinalysis,Complete w/RFL Culture  2. Vulvar burning Normal vulvar exam.  Wet prep Negative.  Reassured. - WET PREP FOR TRICH, YEAST, CLUE  Other orders - ciprofloxacin (CIPRO) 500 MG tablet; Take 1 tablet (500 mg total) by mouth 2 (two) times daily for 7 days.  Kayla [name], it was a pleasure meeting you!  I will inform you of your Urine Culture results as soon as available.

## 2019-05-09 NOTE — Progress Notes (Signed)
    Kayla [name] 1940-11-28 [id number]        79 y.o.  G2P2L2   RP: Dysuria/Postmiction pain/vulvar burning x a few days  HPI: Previous UTI Sxs with U. Culture negative.  Treated for yeast vaginitis and bacterial vaginosis March 30, 2019.  Currently has severe dysuria with a severe crampy pain after passing urine and burning after urination at the vulva.  No abnormal vaginal discharge.  No fever.   OB History  Gravida Para Term Preterm AB Living  2 2 2     2   SAB TAB Ectopic Multiple Live Births               # Outcome Date GA Lbr Len/2nd Weight Sex Delivery Anes PTL Lv  2 Term           1 Term             Past medical history,surgical history, problem list, medications, allergies, family history and social history were all reviewed and documented in the EPIC chart.   Directed ROS with pertinent positives and negatives documented in the history of present illness/assessment and plan.  Exam:  Vitals:   05/09/19 1111  BP: 130/88   General appearance:  Normal  CVAT Negative bilaterally  Abdomen: Normal  Gynecologic exam: Vulva normal.  Speculum:  Vaginal secretions normal.  Wet prep done.  Wet prep Negative  U/A: Yellow cloudy, Prtn Negative, Nitrites Positive, WBC 40-60, RBC 3-10, Bacteria Many.  U. Culture pending.   Assessment/Plan:  79 y.o. [name]   1. Dysuria Abnormal U/A, very probable Acute Cystitis.  Will treat with Cipro 500 mg PO BID x 7 days.  No CI.  Usage reviewed.  Prescription sent to Pharmacy.  U. Culture pending. - Urinalysis,Complete w/RFL Culture  2. Vulvar burning Normal vulvar exam.  Wet prep Negative.  Reassured. - WET PREP FOR TRICH, YEAST, CLUE  Other orders - ciprofloxacin (CIPRO) 500 MG tablet; Take 1 tablet (500 mg total) by mouth 2 (two) times daily for 7 days.  Counseling on above issues and coordination of care >50% x 15 minutes  96 MD, 11:21 AM 05/09/2019

## 2019-05-11 LAB — URINALYSIS, COMPLETE W/RFL CULTURE
Bilirubin Urine: NEGATIVE
Glucose, UA: NEGATIVE
Hyaline Cast: NONE SEEN /LPF
Ketones, ur: NEGATIVE
Nitrites, Initial: POSITIVE — AB
Protein, ur: NEGATIVE
Specific Gravity, Urine: 1.02 (ref 1.001–1.03)
pH: 5.5 (ref 5.0–8.0)

## 2019-05-11 LAB — URINE CULTURE
MICRO NUMBER:: 10027633
SPECIMEN QUALITY:: ADEQUATE

## 2019-05-11 LAB — CULTURE INDICATED

## 2019-05-13 ENCOUNTER — Telehealth: Payer: Self-pay | Admitting: *Deleted

## 2019-05-13 MED ORDER — FLUCONAZOLE 150 MG PO TABS: ORAL_TABLET | ORAL | 0 refills | Status: DC

## 2019-05-13 NOTE — Telephone Encounter (Signed)
Dr.Lavoie told me verbally to tell patient to continue Cipro 500 mg tablets and push fluid. Send in Rx for diflucan tablet 150 mg # 2 take one tablet now and repeat at the end of Cipro Rx. Patient aware to hold vastatin while taking diflucan tablet. I asked patient to repeat direction and she verbalized she understood. Rx sent for diflucan.

## 2019-05-13 NOTE — Telephone Encounter (Signed)
Patient was seen on 05/09/19 and treated for UTI with cipro 500 mg tablet bid x 7 days. Patient called today to report she is not feeling any better, feels like she is "burning inside", pressure and spasms. Still having burning at the end of stream. Patient said medication is not helping, would prefer another. Patient aware to push fluids Rx. Please advise

## 2019-05-18 ENCOUNTER — Ambulatory Visit: Payer: PPO | Attending: Internal Medicine

## 2019-05-18 DIAGNOSIS — Z23 Encounter for immunization: Secondary | ICD-10-CM | POA: Insufficient documentation

## 2019-05-18 NOTE — Progress Notes (Signed)
   Covid-19 Vaccination Clinic  Name:  Kayla [name]    MRN: [id number] DOB: 1940-07-25  05/18/2019  Ms. Kayla [name] was observed post Covid-19 immunization for 15 minutes without incidence. She was provided with Vaccine Information Sheet and instruction to access the V-Safe system.   Ms. Kayla [name] was instructed to call 911 with any severe reactions post vaccine: Marland Kitchen Difficulty breathing  . Swelling of your face and throat  . A fast heartbeat  . A bad rash all over your body  . Dizziness and weakness    Immunizations Administered    Name Date Dose VIS Date Route   Pfizer COVID-19 Vaccine 05/18/2019  3:21 PM 0.3 mL 04/08/2019 Intramuscular   Manufacturer: [employer], [employer]   Lot: [id number]   NDC: [address]-2

## 2019-05-20 ENCOUNTER — Encounter: Payer: PPO | Admitting: Obstetrics and Gynecology

## 2019-06-08 ENCOUNTER — Encounter: Payer: Self-pay | Admitting: Obstetrics and Gynecology

## 2019-06-08 ENCOUNTER — Ambulatory Visit (INDEPENDENT_AMBULATORY_CARE_PROVIDER_SITE_OTHER): Payer: PPO | Admitting: Obstetrics and Gynecology

## 2019-06-08 ENCOUNTER — Other Ambulatory Visit: Payer: Self-pay

## 2019-06-08 ENCOUNTER — Telehealth: Payer: Self-pay | Admitting: *Deleted

## 2019-06-08 ENCOUNTER — Ambulatory Visit: Payer: PPO | Attending: Internal Medicine

## 2019-06-08 VITALS — BP 122/78 | Ht 68.0 in | Wt 161.0 lb

## 2019-06-08 DIAGNOSIS — N898 Other specified noninflammatory disorders of vagina: Secondary | ICD-10-CM | POA: Diagnosis not present

## 2019-06-08 DIAGNOSIS — Z01419 Encounter for gynecological examination (general) (routine) without abnormal findings: Secondary | ICD-10-CM | POA: Diagnosis not present

## 2019-06-08 DIAGNOSIS — N9489 Other specified conditions associated with female genital organs and menstrual cycle: Secondary | ICD-10-CM

## 2019-06-08 DIAGNOSIS — R300 Dysuria: Secondary | ICD-10-CM | POA: Diagnosis not present

## 2019-06-08 DIAGNOSIS — N952 Postmenopausal atrophic vaginitis: Secondary | ICD-10-CM

## 2019-06-08 DIAGNOSIS — M8580 Other specified disorders of bone density and structure, unspecified site: Secondary | ICD-10-CM | POA: Diagnosis not present

## 2019-06-08 DIAGNOSIS — Z23 Encounter for immunization: Secondary | ICD-10-CM | POA: Insufficient documentation

## 2019-06-08 LAB — URINALYSIS, COMPLETE W/RFL CULTURE
Bacteria, UA: NONE SEEN /HPF
Bilirubin Urine: NEGATIVE
Glucose, UA: NEGATIVE
Hgb urine dipstick: NEGATIVE
Hyaline Cast: NONE SEEN /LPF
Ketones, ur: NEGATIVE
Leukocyte Esterase: NEGATIVE
Nitrites, Initial: NEGATIVE
Protein, ur: NEGATIVE
RBC / HPF: NONE SEEN /HPF (ref 0–2)
Specific Gravity, Urine: 1.015 (ref 1.001–1.03)
WBC, UA: NONE SEEN /HPF (ref 0–5)
pH: 5 (ref 5.0–8.0)

## 2019-06-08 LAB — NO CULTURE INDICATED

## 2019-06-08 NOTE — Progress Notes (Signed)
Kayla [name] June 26, 1940 [id number]  SUBJECTIVE:  79 y.o. [name] female for annual routine gynecologic exam. Her concern today surrounds her new onset urinary discomfort that has been present for the past 3 to 4 months.  She has been seen in the office last month a few times and had been treated for vaginal candidiasis and bacterial vaginosis on 03/30/2019.  She did return to the office about 5 weeks later with severe dysuria and crampy pain after passing urine with some burning after urination at the vulva.  She denies any vaginal discharge or bleeding.  She was treated for acute cystitis with ciprofloxacin.  Her vaginal wet prep at that time was negative.  She says her symptoms got a little better but she is still having ongoing slight dysuria, particularly notable with voiding throughout the day and overnight but absent during most first morning voids.  Sometimes she will get a sharp pain in her lower abdomen just after emptying her bladder, enough to briefly take her breath away.  She also has some ongoing vulvar irritation after passing urine.  She is not taking any HRT.   Current Outpatient Medications  Medication Sig Dispense Refill  . Ascorbic Acid (VITA-C PO) Take 1,000 mg by mouth 3 (three) times daily.     Marland Kitchen aspirin 81 MG tablet Take 81 mg by mouth daily.      . Calcium-Vitamin D (POSTURE-D PO) Take 1 tablet by mouth 2 (two) times daily.     . Cholecalciferol (VITAMIN D) 1000 UNITS capsule Take 1,000 Units by mouth daily.     . clopidogrel (PLAVIX) 75 MG tablet TAKE (1) TABLET BY MOUTH ONCE DAILY. (Patient taking differently: Take 75 mg by mouth daily. ) 90 tablet 3  . Coenzyme Q10 (COQ10) 100 MG CAPS Take 100 mg by mouth daily.     . Methylcellulose, Laxative, (CITRUCEL PO) Take 1 packet by mouth at bedtime.     . metoprolol tartrate (LOPRESSOR) 50 MG tablet TAKE (1/2) TABLET BY MOUTH TWICE DAILY. 90 tablet 1  . nitroGLYCERIN (NITROSTAT) 0.4 MG SL tablet Place 1 tablet (0.4 mg total)  under the tongue every 5 (five) minutes as needed for chest pain. 25 tablet 3  . Omega-3 Fatty Acids (FISH OIL) 1200 MG CAPS Take 1,200 mg by mouth daily.    . Probiotic Product (ALIGN) 4 MG CAPS Take 4 mg by mouth daily.    . rosuvastatin (CRESTOR) 10 MG tablet TAKE 1 TABLET BY MOUTH DAILY. 90 tablet 1  . fluconazole (DIFLUCAN) 150 MG tablet Take one tablet now and repeat in 3 days (Patient not taking: Reported on 06/08/2019) 2 tablet 0  . metroNIDAZOLE (METROGEL) 0.75 % vaginal gel Place 1 Applicatorful vaginally at bedtime. For 7 days (Patient not taking: Reported on 06/08/2019) 70 g 0   No current facility-administered medications for this visit.   Allergies: Codeine, Lipitor [atorvastatin calcium], and Simvastatin  No LMP recorded. Patient has had a hysterectomy.  Past medical history,surgical history, problem list, medications, allergies, family history and social history were all reviewed and documented as reviewed in the EPIC chart.  ROS:  Feeling well. No dyspnea or chest pain on exertion.  No abdominal pain, change in bowel habits, black or bloody stools.  Urinary symptoms as described above in HPI. GYN ROS:  no abnormal bleeding, pelvic pain or discharge, no breast pain or new or enlarging lumps on self exam. No neurological complaints.  OBJECTIVE:  BP 122/78   Ht 5\' 8"  (1.727  m)   Wt 161 lb (73 kg)   BMI 24.48 kg/m  The patient appears well, alert, oriented x 3, in no distress. ENT normal.  Neck supple. No cervical or supraclavicular adenopathy or thyromegaly.  Lungs are clear, good air entry, no wheezes, rhonchi or rales. S1 and S2 normal, no murmurs, regular rate and rhythm.  Abdomen soft without tenderness, guarding, mass or organomegaly.  Neurological is normal, no focal findings.  BREAST EXAM: breasts appear normal, no suspicious masses, no skin or nipple changes or axillary nodes  PELVIC EXAM: VULVA: normal appearing vulva with no masses, tenderness or lesions, VAGINA:  normal appearing vagina with normal color and scant normal secretions, no lesions, CERVIX: surgically absent, UTERUS: surgically absent, vaginal cuff well healed, ADNEXA: normal adnexa in size, nontender and no masses, RECTAL: normal rectal, no masses  Chaperone: Kennon Portela present during the examination  Urinalysis unremarkable and negative  ASSESSMENT:  79 y.o. [id number] here for annual gynecologic exam  PLAN:   1. Postmenopausal.  Previous hysterectomy for AUB/adenomyosis.  No discharge or vaginal bleeding or significant vasomotor symptoms at this time. 2. Pap smear 2011.  She is comfortable no longer screening based on age and previous history criteria. 3.  Dysuria/vulvar burning.  Uncertain etiology.  Treated for various vaginitis etiologies and recently a UTI, but symptoms persist.  UA is negative today.  Exam is normal today.  I offered her the option of continue to monitor for improvement in symptoms, but knowing that postmenopausal vaginitis can increase the amount of time before symptoms in these areas resolved, she could try a course of vaginal estrogen cream, she is interested in this so I will have staff send a prescription in for her for compounded estradiol cream.  We instructed her on frequency use.  Risks of use are reviewed.  Also could be bladder spasms, less likely any sort of pelvic growth as her exam is normal today.  No appreciable cystocele.  Could consider pelvic ultrasound or referral for cystometry if symptoms are ongoing. 4. Mammogram 01/2019. Will continue with annual mammography. Breast exam normal today. 5. Colonoscopy 2013. Recommended that she continue per the prescribed interval. Recommended that she pursue getting this done this year, and she indicates she has contact information to facilitate this. 6. Osteopenia.  DEXA 01/2019 T score -1.1 left femoral neck.  FRAX 11% / 2.7%. Repeat DEXA in 2022. 7. Health maintenance.  No lab work as she has this completed with  her primary care provider.    Return annually or sooner, prn.  Theresia Majors MD  06/08/19

## 2019-06-08 NOTE — Telephone Encounter (Signed)
-----   Message from Theresia Majors, MD sent at 06/08/2019  2:38 PM EST ----- Hi, can we please prescribe the compounded vaginal estrogen cream for Mrs. Agent? [place] so I'm not sure which of the 2 pharmacies would be best for her. Thanks!

## 2019-06-08 NOTE — Patient Instructions (Signed)
I will have the staff call in a prescription for compounded vaginal estrogen cream Apply this cream to the vulva and just at the opening of the vagina every night for the next week, followed by every other night for the following week, and then 2 nights per week after that We will plan to repeat the DEXA/bone density scan in 2022.  Continue weight bearing exercise, and vitamin D/calcium intake.

## 2019-06-08 NOTE — Telephone Encounter (Signed)
Left message for patient to call.

## 2019-06-08 NOTE — Progress Notes (Signed)
   Covid-19 Vaccination Clinic  Name:  Kayla [name]    MRN: [id number] DOB: 03/03/1941  06/08/2019  Kayla [name] was observed post Covid-19 immunization for 15 minutes without incidence. She was provided with Vaccine Information Sheet and instruction to access the V-Safe system.   Kayla [name] was instructed to call 911 with any severe reactions post vaccine: Marland Kitchen Difficulty breathing  . Swelling of your face and throat  . A fast heartbeat  . A bad rash all over your body  . Dizziness and weakness    Immunizations Administered    Name Date Dose VIS Date Route   Pfizer COVID-19 Vaccine 06/08/2019  3:29 PM 0.3 mL 04/08/2019 Intramuscular   Manufacturer: [employer], [employer]   Lot: [id number]   NDC: [address]-3

## 2019-06-09 MED ORDER — NONFORMULARY OR COMPOUNDED ITEM: 3 refills | Status: DC

## 2019-06-09 NOTE — Telephone Encounter (Signed)
Patient called back and would like Rx called into [facility].

## 2019-06-14 ENCOUNTER — Ambulatory Visit: Payer: PPO | Admitting: Cardiovascular Disease

## 2019-07-06 ENCOUNTER — Ambulatory Visit: Payer: PPO | Admitting: Cardiovascular Disease

## 2019-07-06 ENCOUNTER — Encounter: Payer: Self-pay | Admitting: Cardiovascular Disease

## 2019-07-06 ENCOUNTER — Other Ambulatory Visit: Payer: Self-pay

## 2019-07-06 VITALS — BP 126/84 | HR 70 | Temp 97.3°F | Ht 68.5 in | Wt 158.0 lb

## 2019-07-06 DIAGNOSIS — I10 Essential (primary) hypertension: Secondary | ICD-10-CM

## 2019-07-06 DIAGNOSIS — I25118 Atherosclerotic heart disease of native coronary artery with other forms of angina pectoris: Secondary | ICD-10-CM

## 2019-07-06 DIAGNOSIS — E785 Hyperlipidemia, unspecified: Secondary | ICD-10-CM

## 2019-07-06 DIAGNOSIS — I38 Endocarditis, valve unspecified: Secondary | ICD-10-CM

## 2019-07-06 DIAGNOSIS — I252 Old myocardial infarction: Secondary | ICD-10-CM | POA: Diagnosis not present

## 2019-07-06 NOTE — Progress Notes (Signed)
SUBJECTIVE: The patient presents for routine follow-up. She reportedly has a history of a non-Q wave myocardial infarction dating back to 2003. She apparently underwent cardiac catheterization but did not require percutaneous coronary intervention. She has a history of hypercholesterolemia. She has been a widow since 06-25-2014when her husband died of liver cancer.  I reviewed her coronary angiography report from 01/13/2002 which demonstrated a large first diagonal branch with a proximal 95% stenosis. 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  She avoids taking nitroglycerin due to headaches.  Echocardiogram 04/23/17 demonstrated normal left ventricular systolic function and regional wall motion, LVEF 60-65%, mild LVH, grade 1 diastolic dysfunction, mild to moderate mitral regurgitation, and moderate tricuspid regurgitation.  Nuclear stress test on 04/27/17 was low risk. There was a brief 7 beat run of atrial tachycardia during recovery. There were no perfusion abnormalities.  The patient denies any symptoms of chest pain, palpitations, shortness of breath, lightheadedness, dizziness, leg swelling, orthopnea, PND, and syncope.  She has some bilateral hip and knee pain which she attributes to arthritis.   Soc Hx: She has a son and a daughter who live in [place] and [place].  Review of Systems: As per "subjective", otherwise negative.  Allergies  Allergen Reactions  . Codeine Nausea Only  . Lipitor [Atorvastatin Calcium] Other (See Comments)    Muscle aches  . Simvastatin Other (See Comments)    Muscle aches    Current Outpatient Medications  Medication Sig Dispense Refill  . Ascorbic Acid (VITA-C PO) Take 1,000 mg by mouth 3 (three) times daily.     Marland Kitchen aspirin 81 MG tablet Take 81 mg by mouth daily.      . Calcium-Vitamin D (POSTURE-D PO) Take 1 tablet by mouth 2 (two) times daily.     . Cholecalciferol (VITAMIN D)  1000 UNITS capsule Take 1,000 Units by mouth daily.     . clopidogrel (PLAVIX) 75 MG tablet TAKE (1) TABLET BY MOUTH ONCE DAILY. (Patient taking differently: Take 75 mg by mouth daily. ) 90 tablet 3  . Coenzyme Q10 (COQ10) 100 MG CAPS Take 100 mg by mouth daily.     . Methylcellulose, Laxative, (CITRUCEL PO) Take 1 packet by mouth at bedtime.     . metoprolol tartrate (LOPRESSOR) 50 MG tablet TAKE (1/2) TABLET BY MOUTH TWICE DAILY. 90 tablet 1  . nitroGLYCERIN (NITROSTAT) 0.4 MG SL tablet Place 1 tablet (0.4 mg total) under the tongue every 5 (five) minutes as needed for chest pain. 25 tablet 3  . NONFORMULARY OR COMPOUNDED ITEM Estradiol vaginal cream 0.02% insert 1 applicator twice weekly 90 each 3  . Omega-3 Fatty Acids (FISH OIL) 1200 MG CAPS Take 1,200 mg by mouth daily.    . Probiotic Product (ALIGN) 4 MG CAPS Take 4 mg by mouth daily.    . rosuvastatin (CRESTOR) 10 MG tablet TAKE 1 TABLET BY MOUTH DAILY. 90 tablet 1   No current facility-administered medications for this visit.    Past Medical History:  Diagnosis Date  . Hyperlipidemia   . Hypertension   . Ischemic heart disease   . Myocardial infarction (HCC) 2003  . Osteopenia 01/2019   T score -1.1 stable from prior DEXA    Past Surgical History:  Procedure Laterality Date  . ABDOMINAL HYSTERECTOMY  1984   TAH bleeding adenomyosis  . BREAST SURGERY     Breast bx  . CATARACT EXTRACTION W/ INTRAOCULAR LENS  IMPLANT Bilateral 11/2017  . CHOLECYSTECTOMY N/A 11/29/2018   Procedure: LAPAROSCOPIC CHOLECYSTECTOMY WITH INTRAOPERATIVE CHOLANGIOGRAM;  Surgeon: Johnathan Hausen, MD;  Location: [facility];  Service: General;  Laterality: N/A;    Social History   Socioeconomic History  . Marital status: Widowed    Spouse name: Not on file  . Number of children: Not on file  . Years of education: Not on file  . Highest education level: Not on file  Occupational History  . Not on file  Tobacco Use  . Smoking status: Never Smoker  .  Smokeless tobacco: Never Used  Substance and Sexual Activity  . Alcohol use: No    Alcohol/week: 0.0 standard drinks  . Drug use: No  . Sexual activity: Never    Birth control/protection: Surgical    Comment: 1st intercourse 79 yo-Fewer than 5 partners  Other Topics Concern  . Not on file  Social History Narrative  . Not on file   Social Determinants of Health   Financial Resource Strain:   . Difficulty of Paying Living Expenses: Not on file  Food Insecurity:   . Worried About Charity fundraiser in the Last Year: Not on file  . Ran Out of Food in the Last Year: Not on file  Transportation Needs:   . Lack of Transportation (Medical): Not on file  . Lack of Transportation (Non-Medical): Not on file  Physical Activity:   . Days of Exercise per Week: Not on file  . Minutes of Exercise per Session: Not on file  Stress:   . Feeling of Stress : Not on file  Social Connections:   . Frequency of Communication with Friends and Family: Not on file  . Frequency of Social Gatherings with Friends and Family: Not on file  . Attends Religious Services: Not on file  . Active Member of Clubs or Organizations: Not on file  . Attends Archivist Meetings: Not on file  . Marital Status: Not on file  Intimate Partner Violence:   . Fear of Current or Ex-Partner: Not on file  . Emotionally Abused: Not on file  . Physically Abused: Not on file  . Sexually Abused: Not on file    Barbarann Ehlers, RN was present throughout the entirety of the encounter.  Vitals:   07/06/19 1112  BP: 126/84  Pulse: 70  Temp: (!) 97.3 F (36.3 C)  SpO2: 98%  Weight: 158 lb (71.7 kg)  Height: 5' 8.5" (1.74 m)    Wt Readings from Last 3 Encounters:  07/06/19 158 lb (71.7 kg)  06/08/19 161 lb (73 kg)  11/29/18 141 lb (64 kg)     PHYSICAL EXAM General: NAD HEENT: Normal. Neck: No JVD, no thyromegaly. Lungs: Clear to auscultation bilaterally with normal respiratory effort. CV: Regular rate  and rhythm, normal S1/S2, no S3/S4, no murmur. No pretibial or periankle edema.  No carotid bruit.   Abdomen: Soft, nontender, no distention.  Neurologic: Alert and oriented.  Psych: Normal affect. Skin: Normal. Musculoskeletal: No gross deformities.      Labs: Lab Results  Component Value Date/Time   K 4.1 11/23/2018 03:15 PM   BUN 14 11/23/2018 03:15 PM   CREATININE 0.77 11/29/2018 03:55 PM   CREATININE 0.95 (H) 03/12/2015 08:40 AM   ALT 16 03/12/2015 08:40 AM   HGB 13.8 11/29/2018 03:55 PM     Lipids: Lab Results  Component Value Date/Time   LDLCALC 82 03/12/2015 08:40 AM   CHOL 169 03/12/2015 08:40 AM  TRIG 49 03/12/2015 08:40 AM   HDL 77 03/12/2015 08:40 AM       ASSESSMENT AND PLAN:  1. 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 Echocardiogram and nuclear stress test reviewed above with normal left ventricular systolic function and no evidence of ischemia. Continue aspirin, Plavix, metoprolol, and statin. We previously had a lengthy discussion regarding both antiplatelet and statin therapy. Given that she has done so well all these years free of symptoms and without bleeding problems, I think it is reasonable to continue Plavix.  2. Hyperlipidemia:LDL 69 on 07/29/2018. Triglycerides, total cholesterol, and HDL were normal. Continue Crestor 10 mg daily as she is tolerating this dosage without myalgias.   3. Valvular heart disease: Echocardiogram reviewed above with both mitral and tricuspid regurgitation. I will continue to monitor with clinical and echocardiographic surveillance.  4. Hypertension:BP is normal. No changes to therapy.   Disposition: Follow up 6 months virtual visit   Prentice Docker, M.D., F.A.C.C.

## 2019-07-06 NOTE — Patient Instructions (Signed)
Medication Instructions:  Your physician recommends that you continue on your current medications as directed. Please refer to the Current Medication list given to you today.  *If you need a refill on your cardiac medications before your next appointment, please call your pharmacy*   Lab Work: None today If you have labs (blood work) drawn today and your tests are completely normal, you will receive your results only by: . MyChart Message (if you have MyChart) OR . A paper copy in the mail If you have any lab test that is abnormal or we need to change your treatment, we will call you to review the results.   Testing/Procedures: None today   Follow-Up: At [facility], you and your health needs are our priority.  As part of our continuing mission to provide you with exceptional heart care, we have created designated Provider Care Teams.  These Care Teams include your primary Cardiologist (physician) and Advanced Practice Providers (APPs -  Physician Assistants and Nurse Practitioners) who all work together to provide you with the care you need, when you need it.  We recommend signing up for the patient portal called "MyChart".  Sign up information is provided on this After Visit Summary.  MyChart is used to connect with patients for Virtual Visits (Telemedicine).  Patients are able to view lab/test results, encounter notes, upcoming appointments, etc.  Non-urgent messages can be sent to your provider as well.   To learn more about what you can do with MyChart, go to [email or web address].    Your next appointment:   12 month(s)  The format for your next appointment:   In Person  Provider:   Suresh Koneswaran, MD   Other Instructions None      Thank you for choosing [facility] !         

## 2019-07-12 ENCOUNTER — Other Ambulatory Visit: Payer: Self-pay | Admitting: Cardiovascular Disease

## 2019-08-10 ENCOUNTER — Other Ambulatory Visit: Payer: Self-pay | Admitting: Cardiovascular Disease

## 2019-08-16 DIAGNOSIS — N182 Chronic kidney disease, stage 2 (mild): Secondary | ICD-10-CM | POA: Diagnosis not present

## 2019-08-16 DIAGNOSIS — E7800 Pure hypercholesterolemia, unspecified: Secondary | ICD-10-CM | POA: Diagnosis not present

## 2019-08-16 DIAGNOSIS — M1711 Unilateral primary osteoarthritis, right knee: Secondary | ICD-10-CM | POA: Diagnosis not present

## 2019-08-16 DIAGNOSIS — Z79899 Other long term (current) drug therapy: Secondary | ICD-10-CM | POA: Diagnosis not present

## 2019-08-16 DIAGNOSIS — I2510 Atherosclerotic heart disease of native coronary artery without angina pectoris: Secondary | ICD-10-CM | POA: Diagnosis not present

## 2019-08-24 ENCOUNTER — Other Ambulatory Visit: Payer: Self-pay | Admitting: Cardiovascular Disease

## 2019-08-25 IMAGING — RF INTRAOPERATIVE CHOLANGIOGRAM
1 series · 4 of 4 positions shown · non-contrast
Comparison: Abdominal ultrasound-07/01/2018

CLINICAL DATA: Intraoperative cholangiogram during laparoscopic
cholecystectomy.

EXAM:
INTRAOPERATIVE CHOLANGIOGRAM
FLUOROSCOPY TIME:  7 seconds

[Series 1: run · 4 of 38 frames shown]
[frame 4/38]
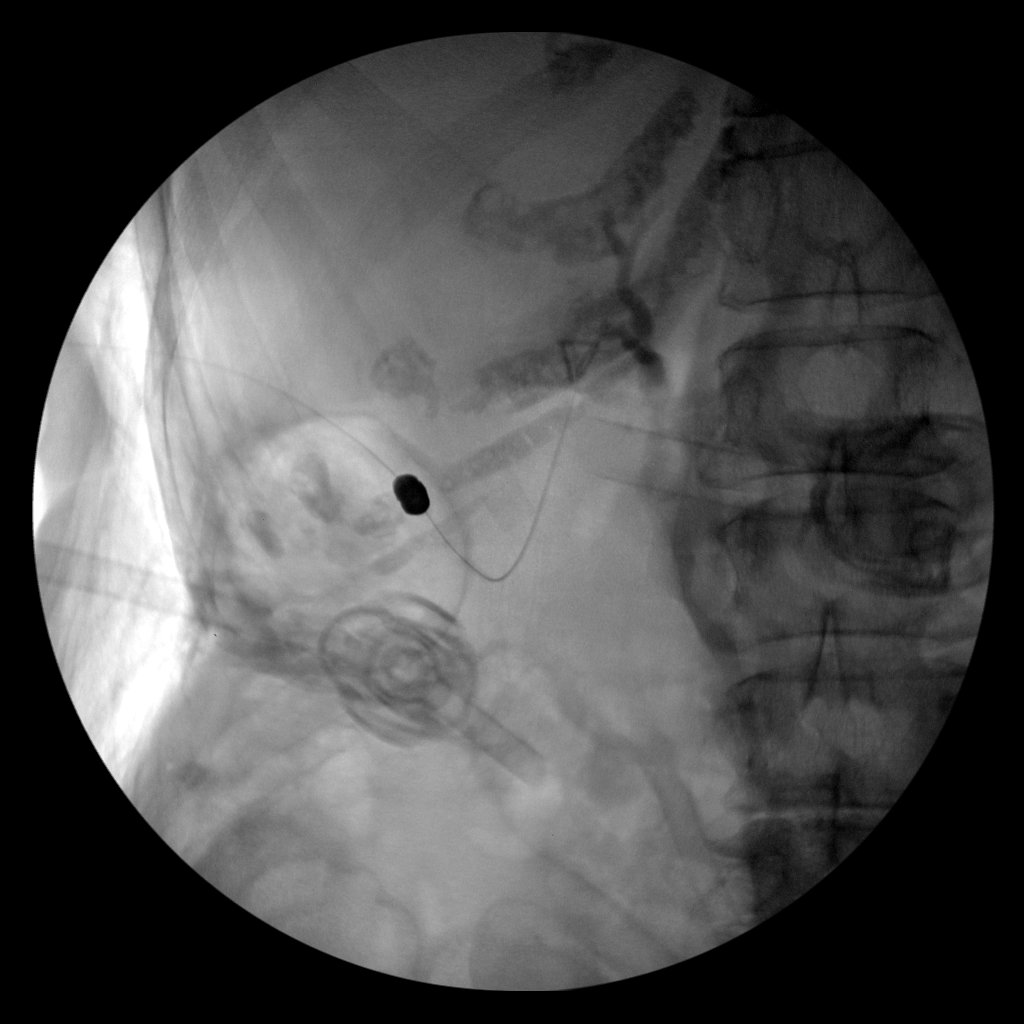
[frame 6/38]
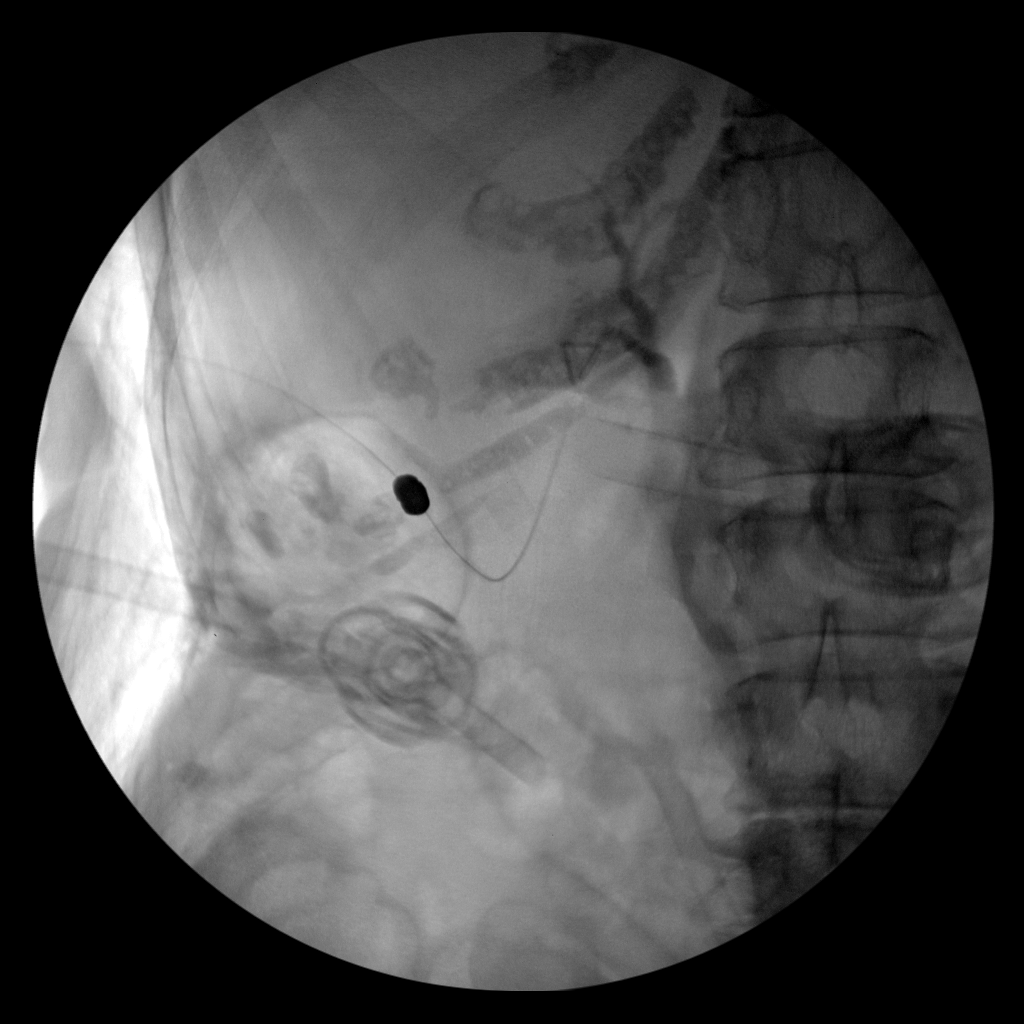
[frame 20/38]
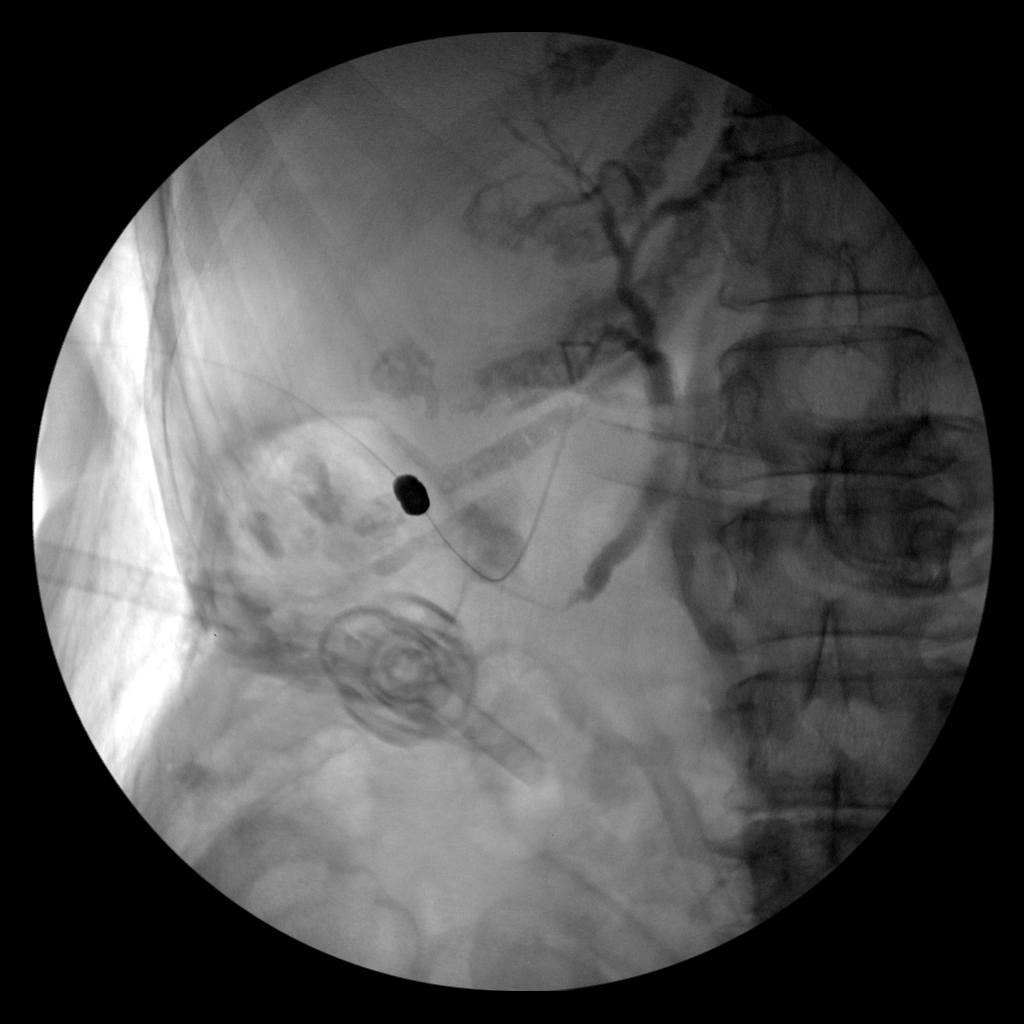
[frame 33/38]
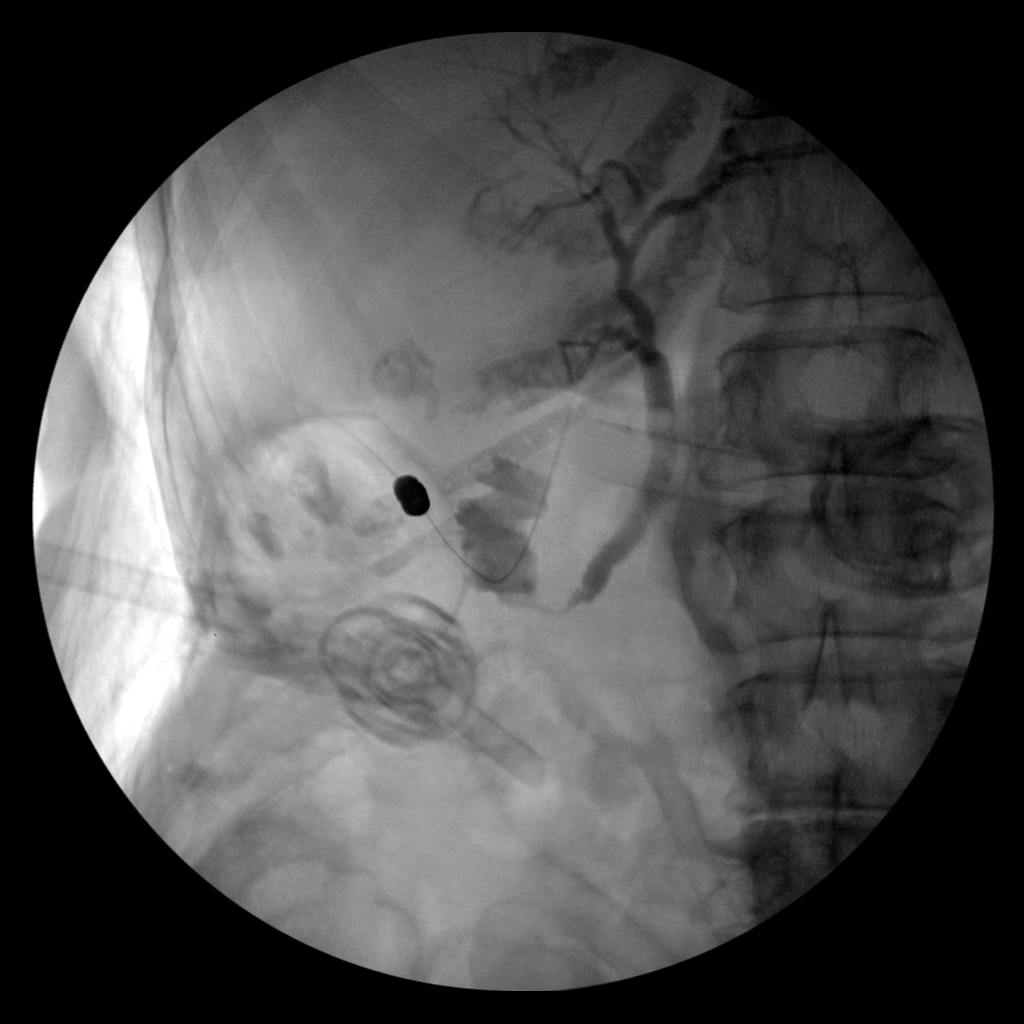

[4 of 4 positions shown; findings below may reference images not displayed]

FINDINGS: Intraoperative cholangiographic images of the right upper abdominal
quadrant during laparoscopic cholecystectomy are provided for
review.

Surgical clips overlie the expected location of the gallbladder
fossa.

Contrast injection demonstrates selective cannulation of the central
aspect of the cystic duct.

There is passage of contrast through the central aspect of the
cystic duct with filling of a non dilated common bile duct. There is
passage of contrast though the CBD and into the descending portion
of the duodenum.

There is minimal reflux of injected contrast into the common hepatic
duct and central aspect of the non dilated intrahepatic biliary
system.

There are two persistent nonocclusive filling defects in the CBD
which could be representative of air bubbles though nonocclusive
choledocholithiasis could have a similar appearance.
IMPRESSION: Intraoperative cholangiogram with nonocclusive filling defects in
the CBD, potentially indicative of air bubbles though nonocclusive
choledocholithiasis could have a similar appearance. Correlation
with the operative report is advised.

## 2019-09-17 ENCOUNTER — Encounter: Payer: Self-pay | Admitting: Emergency Medicine

## 2019-09-17 ENCOUNTER — Other Ambulatory Visit: Payer: Self-pay

## 2019-09-17 ENCOUNTER — Ambulatory Visit
Admission: EM | Admit: 2019-09-17 | Discharge: 2019-09-17 | Disposition: A | Discharge status: Home / Self Care | Payer: PPO | Attending: Emergency Medicine | Admitting: Emergency Medicine

## 2019-09-17 DIAGNOSIS — M542 Cervicalgia: Secondary | ICD-10-CM | POA: Diagnosis not present

## 2019-09-17 DIAGNOSIS — M25512 Pain in left shoulder: Secondary | ICD-10-CM | POA: Diagnosis not present

## 2019-09-17 MED ORDER — ACETAMINOPHEN 500 MG PO TABS: 500.0000 mg | ORAL_TABLET | Freq: Four times a day (QID) | ORAL | 0 refills | Status: DC | PRN

## 2019-09-17 MED ORDER — PREDNISONE 10 MG PO TABS: 20.0000 mg | ORAL_TABLET | Freq: Every day | ORAL | 0 refills | Status: DC

## 2019-09-17 MED ORDER — CYCLOBENZAPRINE HCL 5 MG PO TABS: 5.0000 mg | ORAL_TABLET | Freq: Three times a day (TID) | ORAL | 0 refills | Status: DC | PRN

## 2019-09-17 NOTE — Discharge Instructions (Addendum)
Rest, ice and heat as needed Ensure adequate ROM as tolerated. Prescribed Tylenol for pain relief Prescribed prednisone for inflammation Prescribed flexeril  for muscle spasm.  Do not drive or operate heavy machinery while taking this medication Return here or go to ER if you have any new or worsening symptoms such as numbness/tingling, tingling, numbness, headache/blurry vision, nausea/vomiting, confusion/altered mental status, dizziness, weakness, passing out, imbalance, etc..Marland Kitchen

## 2019-09-17 NOTE — ED Triage Notes (Signed)
Pt here for neck and left shoulder pain x 3 days; denies worsening with movement; pt denies obvious injury

## 2019-09-17 NOTE — ED Provider Notes (Signed)
[facility]    CSN: [id number] Arrival date & time: 09/17/19  1005      History   Chief Complaint Chief Complaint  Patient presents with  . Neck Pain  . Shoulder Pain    HPI Kayla [name] is a 79 y.o. female.   Who presented to the urgent care with a complaint of neck and shoulder pain for the past few days.  Denies any precipitating event..  Localized pain to neck and left shoulder.  She described the pain as constant and achy, currently rated at 5 on a scale 1-10.  She has tried OTC medications without relief.  Her symptoms are made worse with ROM.  She denies similar symptoms in the past.  Denies chills, fever, nausea, vomiting, diarrhea, trauma, injury.       Past Medical History:  Diagnosis Date  . Hyperlipidemia   . Hypertension   . Ischemic heart disease   . Myocardial infarction (HCC) 2003  . Osteopenia 01/2019   T score -1.1 stable from prior DEXA    Patient Active Problem List   Diagnosis Date Noted  . S/P laparoscopic cholecystectomyAugust2020 11/29/2018  . Benign hypertensive heart disease without heart failure 03/01/2013  . Vaginal atrophy 05/03/2012  . Myocardial infarction (HCC)   . Hyperlipidemia   . Ischemic heart disease   . 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 Breast cyst 02/27/2011  . Hyperkalemia 01/28/2011  . Old MI (myocardial infarction) 08/08/2010  . Hypercholesterolemia 08/08/2010  . Osteoporosis 08/08/2010    Past Surgical History:  Procedure Laterality Date  . ABDOMINAL HYSTERECTOMY  1984   TAH bleeding adenomyosis  . BREAST SURGERY     Breast bx  . CATARACT EXTRACTION W/ INTRAOCULAR LENS IMPLANT Bilateral 11/2017  . CHOLECYSTECTOMY N/A 11/29/2018   Procedure: LAPAROSCOPIC CHOLECYSTECTOMY WITH INTRAOPERATIVE CHOLANGIOGRAM;  Surgeon: Luretha Murphy, MD;  Location: [facility];  Service: General;  Laterality: N/A;    OB History    Gravida  2   Para  2   Term  2   Preterm      AB      Living  2     SAB      TAB       Ectopic      Multiple      Live Births               Home Medications    Prior to Admission medications   Medication Sig Start Date End Date Taking? Authorizing Provider  acetaminophen (TYLENOL) 500 MG tablet Take 1 tablet (500 mg total) by mouth every 6 (six) hours as needed. 09/17/19   Adrean Heitz, Zachery Dakins, FNP  Ascorbic Acid (VITA-C PO) Take 1,000 mg by mouth 3 (three) times daily.     [provider]  aspirin 81 MG tablet Take 81 mg by mouth daily.      [provider]  Calcium-Vitamin D (POSTURE-D PO) Take 1 tablet by mouth 2 (two) times daily.     [provider]  Cholecalciferol (VITAMIN D) 1000 UNITS capsule Take 1,000 Units by mouth daily.     [provider]  clopidogrel (PLAVIX) 75 MG tablet TAKE (1) TABLET BY MOUTH ONCE DAILY. 07/12/19   Laqueta Linden, MD  Coenzyme Q10 (COQ10) 100 MG CAPS Take 100 mg by mouth daily.     [provider]  cyclobenzaprine (FLEXERIL) 5 MG tablet Take 1 tablet (5 mg total) by mouth 3 (three) times daily as needed for muscle  spasms. 09/17/19   Kyrra Prada, Darrelyn Hillock, FNP  Methylcellulose, Laxative, (CITRUCEL PO) Take 1 packet by mouth at bedtime.     [provider]  metoprolol tartrate (LOPRESSOR) 50 MG tablet TAKE (1/2) TABLET BY MOUTH TWICE DAILY. 08/25/19   Herminio Commons, MD  nitroGLYCERIN (NITROSTAT) 0.4 MG SL tablet Place 1 tablet (0.4 mg total) under the tongue every 5 (five) minutes as needed for chest pain. 01/26/17   Herminio Commons, MD  NONFORMULARY OR COMPOUNDED ITEM Estradiol vaginal cream 9.76% insert 1 applicator twice weekly 06/09/19   Joseph Pierini, MD  Omega-3 Fatty Acids (FISH OIL) 1200 MG CAPS Take 1,200 mg by mouth daily.    [provider]  predniSONE (DELTASONE) 10 MG tablet Take 2 tablets (20 mg total) by mouth daily. 09/17/19   Mariann Palo, Darrelyn Hillock, FNP  Probiotic Product (ALIGN) 4 MG CAPS Take 4 mg by mouth daily.    [provider]    rosuvastatin (CRESTOR) 10 MG tablet TAKE 1 TABLET BY MOUTH DAILY. 08/11/19   Herminio Commons, MD    Family History Family History  Problem Relation Age of Onset  . Hypertension Mother   . Heart disease Mother   . Osteoporosis Mother   . Heart disease Father   . Colon cancer Father   . Breast cancer Maternal Grandmother        Age unknown  . Cancer Paternal Grandmother        Unknown type    Social History Social History   Tobacco Use  . Smoking status: Never Smoker  . Smokeless tobacco: Never Used  Substance Use Topics  . Alcohol use: No    Alcohol/week: 0.0 standard drinks  . Drug use: No     Allergies   Codeine, Lipitor [atorvastatin calcium], and Simvastatin   Review of Systems Review of Systems  Constitutional: Negative.   Respiratory: Negative.   Cardiovascular: Negative.   Musculoskeletal: Positive for neck pain.       Shoulder pain  All other systems reviewed and are negative.    Physical Exam Triage Vital Signs ED Triage Vitals  Enc Vitals Group     BP 09/17/19 1036 (!) 158/70     Pulse Rate 09/17/19 1036 66     Resp 09/17/19 1036 18     Temp 09/17/19 1036 98.4 F (36.9 C)     Temp Source 09/17/19 1036 Oral     SpO2 09/17/19 1036 97 %     Weight --      Height --      Head Circumference --      Peak Flow --      Pain Score 09/17/19 1037 5     Pain Loc --      Pain Edu? --      Excl. in [place]? --    No data found.  Updated Vital Signs BP (!) 158/70 (BP Location: Right Arm)   Pulse 66   Temp 98.4 F (36.9 C) (Oral)   Resp 18   SpO2 97%   Visual Acuity Right Eye Distance:   Left Eye Distance:   Bilateral Distance:    Right Eye Near:   Left Eye Near:    Bilateral Near:     Physical Exam Vitals and nursing note reviewed.  Constitutional:      General: She is not in acute distress.    Appearance: Normal appearance. She is normal weight. She is not ill-appearing, toxic-appearing or diaphoretic.  Cardiovascular:  Rate and  Rhythm: Normal rate and regular rhythm.     Pulses: Normal pulses.     Heart sounds: Normal heart sounds. No murmur. No friction rub. No gallop.   Pulmonary:     Effort: Pulmonary effort is normal. No respiratory distress.     Breath sounds: Normal breath sounds. No stridor. No wheezing, rhonchi or rales.  Chest:     Chest wall: No tenderness.  Musculoskeletal:        General: Tenderness present.     Right shoulder: Normal.     Left shoulder: Tenderness present.     Cervical back: Full passive range of motion without pain and normal range of motion. Spasms and tenderness present. No edema, rigidity or crepitus. No pain with movement.     Thoracic back: Normal.     Lumbar back: Normal.     Comments: Left shoulder is returning obvious asymmetry or deformity compared to the right.  There is no obvious trauma, ecchymosis, warmth or open wound present.  Normal range of motion with pain.  Neurovascular status intact.  Lymphadenopathy:     Cervical: No cervical adenopathy.  Neurological:     Mental Status: She is alert.      UC Treatments / Results  Labs (all labs ordered are listed, but only abnormal results are displayed) Labs Reviewed - No data to display  EKG   Radiology No results found.  Procedures Procedures (including critical care time)  Medications Ordered in UC Medications - No data to display  Initial Impression / Assessment and Plan / UC Course  I have reviewed the triage vital signs and the nursing notes.  Pertinent labs & imaging results that were available during my care of the patient were reviewed by me and considered in my medical decision making (see chart for details).   Patient is stable for discharge.  Her symptoms are likely from degenerative changes in her joint.  Tylenol, Flexeril and prednisone were prescribed.   Final Clinical Impressions(s) / UC Diagnoses   Final diagnoses:  Neck pain  Pain in joint of left shoulder     Discharge  Instructions     Rest, ice and heat as needed Ensure adequate ROM as tolerated. Prescribed Tylenol for pain relief Prescribed prednisone for inflammation Prescribed flexeril  for muscle spasm.  Do not drive or operate heavy machinery while taking this medication Return here or go to ER if you have any new or worsening symptoms such as numbness/tingling of the inner thighs, loss of bladder or bowel control, headache/blurry vision, nausea/vomiting, confusion/altered mental status, dizziness, weakness, passing out, imbalance, etc...      ED Prescriptions    Medication Sig Dispense Auth. Provider   cyclobenzaprine (FLEXERIL) 5 MG tablet Take 1 tablet (5 mg total) by mouth 3 (three) times daily as needed for muscle spasms. 30 tablet Bayler Nehring S, FNP   predniSONE (DELTASONE) 10 MG tablet Take 2 tablets (20 mg total) by mouth daily. 15 tablet Deatra Mcmahen, Zachery Dakins, FNP   acetaminophen (TYLENOL) 500 MG tablet Take 1 tablet (500 mg total) by mouth every 6 (six) hours as needed. 30 tablet Neesa Knapik, Zachery Dakins, FNP     PDMP not reviewed this encounter.   Durward Parcel, FNP 09/17/19 1117

## 2019-10-13 ENCOUNTER — Other Ambulatory Visit: Payer: Self-pay

## 2019-10-13 MED ORDER — CLOPIDOGREL BISULFATE 75 MG PO TABS: ORAL_TABLET | ORAL | 3 refills | Status: DC

## 2019-10-13 NOTE — Telephone Encounter (Signed)
Refilled plavix 

## 2019-12-21 DIAGNOSIS — E7800 Pure hypercholesterolemia, unspecified: Secondary | ICD-10-CM | POA: Diagnosis not present

## 2019-12-21 DIAGNOSIS — N182 Chronic kidney disease, stage 2 (mild): Secondary | ICD-10-CM | POA: Diagnosis not present

## 2019-12-21 DIAGNOSIS — Z0000 Encounter for general adult medical examination without abnormal findings: Secondary | ICD-10-CM | POA: Diagnosis not present

## 2019-12-21 DIAGNOSIS — Z6825 Body mass index (BMI) 25.0-25.9, adult: Secondary | ICD-10-CM | POA: Diagnosis not present

## 2019-12-21 DIAGNOSIS — I2510 Atherosclerotic heart disease of native coronary artery without angina pectoris: Secondary | ICD-10-CM | POA: Diagnosis not present

## 2020-01-06 ENCOUNTER — Telehealth: Payer: PPO | Admitting: Cardiovascular Disease

## 2020-02-08 DIAGNOSIS — Z23 Encounter for immunization: Secondary | ICD-10-CM | POA: Diagnosis not present

## 2020-02-13 ENCOUNTER — Other Ambulatory Visit: Payer: Self-pay | Admitting: Cardiology

## 2020-02-27 ENCOUNTER — Other Ambulatory Visit: Payer: Self-pay | Admitting: Cardiology

## 2020-04-10 DIAGNOSIS — N182 Chronic kidney disease, stage 2 (mild): Secondary | ICD-10-CM | POA: Diagnosis not present

## 2020-04-10 DIAGNOSIS — E7800 Pure hypercholesterolemia, unspecified: Secondary | ICD-10-CM | POA: Diagnosis not present

## 2020-04-10 DIAGNOSIS — I2510 Atherosclerotic heart disease of native coronary artery without angina pectoris: Secondary | ICD-10-CM | POA: Diagnosis not present

## 2020-04-12 DIAGNOSIS — Z1231 Encounter for screening mammogram for malignant neoplasm of breast: Secondary | ICD-10-CM | POA: Diagnosis not present

## 2020-05-15 ENCOUNTER — Other Ambulatory Visit: Payer: Self-pay | Admitting: Student

## 2020-06-11 ENCOUNTER — Encounter: Payer: PPO | Admitting: Obstetrics and Gynecology

## 2020-06-12 ENCOUNTER — Encounter: Payer: Self-pay | Admitting: Obstetrics and Gynecology

## 2020-06-12 ENCOUNTER — Other Ambulatory Visit: Payer: Self-pay

## 2020-06-12 ENCOUNTER — Ambulatory Visit: Payer: PPO | Admitting: Obstetrics and Gynecology

## 2020-06-12 VITALS — BP 130/70 | HR 64 | Ht 67.0 in | Wt 158.0 lb

## 2020-06-12 DIAGNOSIS — M8580 Other specified disorders of bone density and structure, unspecified site: Secondary | ICD-10-CM

## 2020-06-12 DIAGNOSIS — N952 Postmenopausal atrophic vaginitis: Secondary | ICD-10-CM

## 2020-06-12 DIAGNOSIS — Z01419 Encounter for gynecological examination (general) (routine) without abnormal findings: Secondary | ICD-10-CM

## 2020-06-12 MED ORDER — NONFORMULARY OR COMPOUNDED ITEM: 3 refills | Status: DC

## 2020-06-12 NOTE — Progress Notes (Signed)
Kayla [name] 1940/12/07 [id number]  SUBJECTIVE:  80 y.o. [name] female for annual routine breast and pelvic exam. Has experienced significant relief from the vaginal irritation and recurrent dysuria/UTI since starting vaginal estrogen cream last year.  She is using the cream 2 nights per week.  Current Outpatient Medications  Medication Sig Dispense Refill  . Ascorbic Acid (VITA-C PO) Take 1,000 mg by mouth 3 (three) times daily.     Marland Kitchen aspirin 81 MG tablet Take 81 mg by mouth daily.    . Calcium-Vitamin D (POSTURE-D PO) Take 1 tablet by mouth 2 (two) times daily.    . Cholecalciferol (VITAMIN D) 1000 UNITS capsule Take 1,000 Units by mouth daily.    . clopidogrel (PLAVIX) 75 MG tablet TAKE (1) TABLET BY MOUTH ONCE DAILY. 90 tablet 3  . Coenzyme Q10 (COQ10) 100 MG CAPS Take 100 mg by mouth daily.    . Methylcellulose, Laxative, (CITRUCEL PO) Take 1 packet by mouth at bedtime.    . metoprolol tartrate (LOPRESSOR) 50 MG tablet TAKE (1/2) TABLET BY MOUTH TWICE DAILY. 90 tablet 2  . nitroGLYCERIN (NITROSTAT) 0.4 MG SL tablet Place 1 tablet (0.4 mg total) under the tongue every 5 (five) minutes as needed for chest pain. 25 tablet 3  . NONFORMULARY OR COMPOUNDED ITEM Estradiol vaginal cream 0.02% insert 1 applicator twice weekly 90 each 3  . Omega-3 Fatty Acids (FISH OIL) 1200 MG CAPS Take 1,200 mg by mouth daily.    . Probiotic Product (ALIGN) 4 MG CAPS Take 4 mg by mouth daily.    . rosuvastatin (CRESTOR) 10 MG tablet TAKE 1 TABLET BY MOUTH DAILY. 90 tablet 2  . acetaminophen (TYLENOL) 500 MG tablet Take 1 tablet (500 mg total) by mouth every 6 (six) hours as needed. (Patient not taking: Reported on 06/12/2020) 30 tablet 0   No current facility-administered medications for this visit.   Allergies: Codeine, Lipitor [atorvastatin calcium], and Simvastatin  No LMP recorded. Patient has had a hysterectomy.  Past medical history,surgical history, problem list, medications, allergies, family  history and social history were all reviewed and documented as reviewed in the EPIC chart.  ROS: Positives and negatives as reviewed in HPI  OBJECTIVE:  BP 130/70 (BP Location: Right Arm, Patient Position: Sitting, Cuff Size: Normal)   Pulse 64   Ht 5\' 7"  (1.702 m)   Wt 158 lb (71.7 kg)   BMI 24.75 kg/m  The patient appears well, alert, oriented, in no distress.  BREAST EXAM: breasts appear normal, no suspicious masses, no skin or nipple changes or axillary nodes  PELVIC EXAM: VULVA: normal appearing vulva with atrophic change, no masses, tenderness or lesions, VAGINA: normal appearing vagina with trophic change, normal color and scant normal secretions, no lesions, CERVIX: surgically absent, UTERUS: surgically absent, vaginal cuff normal, ADNEXA: normal adnexa in size, nontender and no masses  Chaperone: present during the examination   ASSESSMENT:  80 y.o. [name] here for annual breast and pelvic exam  PLAN:   1. Postmenopausal/atrophic genital changes.  Previous hysterectomy for AUB/adenomyosis.  No discharge or vaginal bleeding or significant vasomotor symptoms at this time.  Her atrophic vaginitis and recurrent dysuria symptoms have greatly improved using the vaginal estradiol cream twice weekly with prefilled sy[facility]  We discussed the potential risks of systemic absorption to include thrombosis and the breast cancer issue.  At this point benefits outweigh the risks and she would like to continue.  I did send  a message to staff to call in a refill for her. 2. Pap smear 2011.  She is comfortable no longer screening based on age and previous history criteria. 3. Mammogram done at [facility] this last year per patient (record not in our EMR).  She will continue with annual mammography. Breast exam normal today. 4. Colonoscopy 2013. Recommended that she continue per the prescribed interval per her primary. 5. Osteopenia.  DEXA  01/2019 T score -1.1 left femoral neck.  FRAX 11% / 2.7%. Repeat DEXA in fall 2022. 6. Health maintenance.  No lab work as she has this completed with her primary care provider.    Return annually or sooner, prn.  Theresia Majors MD  06/12/20

## 2020-06-13 ENCOUNTER — Telehealth: Payer: Self-pay | Admitting: *Deleted

## 2020-06-13 MED ORDER — NONFORMULARY OR COMPOUNDED ITEM: 3 refills | Status: DC

## 2020-06-13 NOTE — Telephone Encounter (Signed)
Rx called in 

## 2020-06-13 NOTE — Telephone Encounter (Signed)
-----   Message from Theresia Majors, MD sent at 06/12/2020  3:16 PM EST ----- Regarding: vaginal estradiol cream Please call in [facility] estradiol cream twice weekly

## 2020-06-18 DIAGNOSIS — Z961 Presence of intraocular lens: Secondary | ICD-10-CM | POA: Diagnosis not present

## 2020-07-10 NOTE — Progress Notes (Unsigned)
Cardiology Office Note:    Date:  07/12/2020   ID:  Kayla [name], DOB 01-26-1941, MRN [id number]  PCP:  Ralene Ok, MD   [facility]  Cardiologist:  Prentice Docker, MD (Inactive)  Advanced Practice Provider:  No care team member to display Electrophysiologist:  None   Referring MD: Ralene Ok, MD    History of Present Illness:    Kayla [name] is a 80 y.o. female with a hx of CAD with MI in 2003 (no PCI), HLD, and HTN who was previously followed by Dr. Purvis Sheffield who now presents to clinic for follow-up.  Patient with remote history of MI in 2003. 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 No PCI performed. TTE 04/23/17 demonstrated normal left ventricular systolic function and regional wall motion, LVEF 60-65%, mild LVH, grade 1 diastolic dysfunction, mild to moderate mitral regurgitation, and moderate tricuspid regurgitation. Nuclear stress test on 04/27/17 was low risk. There was a brief 7 beat run of atrial tachycardia during recovery. There were no perfusion abnormalities.  She now presents to clinic for follow-up. The patient feels overall well with no chest pain, SOB, palpitations, LE edema. Occasionally uses stationary bike at home with no issues. Blood pressures well controlled at home. Occasional LE edema after prolonged sitting or salty food. No orthopnea, PND.   Past Medical History:  Diagnosis Date  . Hyperlipidemia   . Hypertension   . Ischemic heart disease   . Myocardial infarction (HCC) 2003  . Osteopenia 01/2019   T score -1.1 stable from prior DEXA    Past Surgical History:  Procedure Laterality Date  . ABDOMINAL HYSTERECTOMY  1984   TAH bleeding adenomyosis  . BREAST SURGERY     Breast bx  . CATARACT EXTRACTION W/ INTRAOCULAR LENS IMPLANT Bilateral 11/2017  .  CHOLECYSTECTOMY N/A 11/29/2018   Procedure: LAPAROSCOPIC CHOLECYSTECTOMY WITH INTRAOPERATIVE CHOLANGIOGRAM;  Surgeon: Luretha Murphy, MD;  Location: [facility];  Service: General;  Laterality: N/A;    Current Medications: Current Meds  Medication Sig  . Ascorbic Acid (VITA-C PO) Take 1,000 mg by mouth in the morning and at bedtime.  Marland Kitchen aspirin 81 MG tablet Take 81 mg by mouth daily.  . Calcium Carbonate (CALTRATE 600 PO) Take by mouth. Per patient taking 1 tablet twice a day  . Calcium-Vitamin D (POSTURE-D PO) Take 1 tablet by mouth 2 (two) times daily.  . Cholecalciferol (VITAMIN D) 1000 UNITS capsule Take 1,000 Units by mouth daily.  . clopidogrel (PLAVIX) 75 MG tablet TAKE (1) TABLET BY MOUTH ONCE DAILY.  Marland Kitchen Coenzyme Q10 (COQ10) 100 MG CAPS Take 100 mg by mouth daily.  Marland Kitchen estradiol (ESTRACE) 0.1 MG/GM vaginal cream Place 1 Applicatorful vaginally 2 (two) times a week. Per patient not sure of dosage  . Methylcellulose, Laxative, (CITRUCEL PO) Take 1 packet by mouth at bedtime.  . metoprolol tartrate (LOPRESSOR) 50 MG tablet TAKE (1/2) TABLET BY MOUTH TWICE DAILY.  . nitroGLYCERIN (NITROSTAT) 0.4 MG SL tablet Place 1 tablet (0.4 mg total) under the tongue every 5 (five) minutes as needed for chest pain.  . NONFORMULARY OR COMPOUNDED ITEM Estradiol vaginal cream 0.02% insert 1 applicator twice weekly  . Omega-3 Fatty Acids (FISH OIL) 1200 MG CAPS Take 1,200 mg by mouth daily.  . Probiotic Product (ALIGN) 4 MG CAPS Take 4 mg by mouth daily.  Marland Kitchen  rosuvastatin (CRESTOR) 10 MG tablet TAKE 1 TABLET BY MOUTH DAILY.     Allergies:   Codeine, Lipitor [atorvastatin calcium], and Simvastatin   Social History   Socioeconomic History  . Marital status: Widowed    Spouse name: Not on file  . Number of children: Not on file  . Years of education: Not on file  . Highest education level: Not on file  Occupational History  . Not on file  Tobacco Use  . Smoking status: Never Smoker  . Smokeless tobacco:  Never Used  Vaping Use  . Vaping Use: Never used  Substance and Sexual Activity  . Alcohol use: No    Alcohol/week: 0.0 standard drinks  . Drug use: No  . Sexual activity: Not Currently    Birth control/protection: Surgical    Comment: 1st intercourse 80 yo-Fewer than 5 partners  Other Topics Concern  . Not on file  Social History Narrative  . Not on file   Social Determinants of Health   Financial Resource Strain: Not on file  Food Insecurity: Not on file  Transportation Needs: Not on file  Physical Activity: Not on file  Stress: Not on file  Social Connections: Not on file     Family History: The patient's family history includes Breast cancer in [name] maternal grandmother; Cancer in [name] paternal grandmother; Colon cancer in [name] father; Heart disease in [name] father and mother; Hypertension in [name] mother; Osteoporosis in [name] mother.  ROS:   Please see the history of present illness.    Review of Systems  Constitutional: Negative for chills and fever.  HENT: Negative for hearing loss.   Eyes: Negative for blurred vision and redness.  Respiratory: Negative for shortness of breath.   Cardiovascular: Positive for leg swelling. Negative for chest pain, palpitations, orthopnea, claudication and PND.  Gastrointestinal: Negative for melena, nausea and vomiting.  Genitourinary: Negative for dysuria and flank pain.  Musculoskeletal: Negative for falls.  Neurological: Negative for dizziness and loss of consciousness.  Endo/Heme/Allergies: Negative for polydipsia.  Psychiatric/Behavioral: Negative for substance abuse.    EKGs/Labs/Other Studies Reviewed:    The following studies were reviewed today: TTE 2016-09-07: Study Conclusions   - Left ventricle: The cavity size was normal. Wall thickness was  increased in a pattern of mild LVH. Systolic function was normal.  The estimated ejection fraction was in the range of 60% to 65%.  Wall motion was normal; there were no regional  wall motion  abnormalities. Doppler parameters are consistent with abnormal  left ventricular relaxation (grade 1 diastolic dysfunction).  - Aortic valve: Trileaflet; mildly thickened, mildly calcified  leaflets.  - Mitral valve: Mildly calcified annulus. Mildly thickened leaflets  . There was mild to moderate regurgitation.  - Right atrium: The atrium was mildly dilated.  - Tricuspid valve: There was moderate regurgitation.  Myoview 2016-09-07:  Blood pressure demonstrated a normal response to exercise.  There was no ST segment deviation noted during stress.  7 beat run of atrial tachycardia during recovery asymptomatic.  The study is normal. There are no perfusion defects  This is a low risk study.  The left ventricular ejection fraction is hyperdynamic (>65%).  Elevated TID of 1.47 in setting of otherwise completely normal study, nonspecific finding.   EKG:  EKG is  ordered today.  The ekg ordered today demonstrates NSR with HR 66  Recent Labs: No results found for requested labs within last 8760 hours.  Recent Lipid Panel    Component Value Date/Time  CHOL 169 03/12/2015 0840   TRIG 49 03/12/2015 0840   HDL 77 03/12/2015 0840   CHOLHDL 2.2 03/12/2015 0840   VLDL 10 03/12/2015 0840   LDLCALC 82 03/12/2015 0840     Physical Exam:    VS:  BP 120/84   Pulse 66   Ht 5\' 8"  (1.727 m)   Wt 151 lb 6.4 oz (68.7 kg)   SpO2 97%   BMI 23.02 kg/m     Wt Readings from Last 3 Encounters:  07/12/20 151 lb 6.4 oz (68.7 kg)  06/12/20 158 lb (71.7 kg)  07/06/19 158 lb (71.7 kg)     GEN:  Well nourished, well developed in no acute distress HEENT: Normal NECK: No JVD; No carotid bruits CARDIAC: RRR, 1/6 systolic murmur. No  rubs, gallops RESPIRATORY:  Clear to auscultation without rales, wheezing or rhonchi  ABDOMEN: Soft, non-tender, non-distended MUSCULOSKELETAL:  No edema; No deformity  SKIN: Warm and dry NEUROLOGIC:  Alert and oriented x 3 PSYCHIATRIC:  Normal  affect   ASSESSMENT:    1. Moderate mitral regurgitation   2. Coronary artery disease of native artery of native heart with stable angina pectoris (HCC)   3. Essential hypertension   4. Hyperlipidemia LDL goal <70   5. History of myocardial infarction    PLAN:    In order of problems listed above:  #Known CAD s/p remote MI in 2003: Cath 01/13/2002 withlarge first diagonal branch with a proximal 95% stenosis. 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 No PCI performed. TTE 04/23/17 demonstrated normal left ventricular systolic function and regional wall motion, LVEF 60-65%, mild LVH, grade 1 diastolic dysfunction, mild to moderate mitral regurgitation, and moderate tricuspid regurgitation. Nuclear stress test on 04/27/17 was low risk. No anginal symptoms and patient is active without exercise limitations. -Continue ASA 81mg  daily -Continue plavix 75mg  daily -Change to metop XL -Continue crestor 10mg  daily; will obtain records from PCP to ensure LDL at goal <70; if not, will likely need zetia as has not tolerated higher dose due to muscle aches  #Mild-to-moderate MR #Moderate TR: Asymptomatic and euvolemic. -Repeat TTE for surveillance -Manage HTN as below  #HTN: -Continue metoprolol 25mg  BID  #HLD: -Continue crestor 10mg  daily -Will ensure LDL obtained by PCP <70 and if not, add zetia as has not tolerated higher doses of statin due to myalgias    Medication Adjustments/Labs and Tests Ordered: Current medicines are reviewed at length with the patient today.  Concerns regarding medicines are outlined above.  Orders Placed This Encounter  Procedures  . EKG 12-Lead  . ECHOCARDIOGRAM COMPLETE   No orders of the defined types were placed in this encounter.   Patient Instructions  Medication Instructions:   Your physician recommends that you continue on your current medications as directed. Please refer to the Current Medication  list given to you today.  *If you need a refill on your cardiac medications before your next appointment, please call your pharmacy*   Lab Work:  PLEASE OBTAIN YOUR RECENT LABS (LDL/LIPIDS) FROM YOUR PRIMARY CARE PHYSICIAN AND SEND THEM TO US.  THEY CAN FAX THEM TO OUR OFFICE AT [phone] ATTENTION DR. Shari ProwsP[place], OR YOU CAN SCAN THEM TO US VIA YOUR ACTIVE MYCHART ACCOUNT.   If you have labs (blood work) drawn today and your tests are completely normal, you will receive your results only by: Marland Kitchen. MyChart Message (if you have MyChart) OR . A paper copy in the  mail If you have any lab test that is abnormal or we need to change your treatment, we will call you to review the results.   Testing/Procedures:  Your physician has requested that you have an echocardiogram. Echocardiography is a painless test that uses sound waves to create images of your heart. It provides your doctor with information about the size and shape of your heart and how well your heart's chambers and valves are working. This procedure takes approximately one hour. There are no restrictions for this procedure.   Follow-Up: At [facility], you and your health needs are our priority.  As part of our continuing mission to provide you with exceptional heart care, we have created designated Provider Care Teams.  These Care Teams include your primary Cardiologist (physician) and Advanced Practice Providers (APPs -  Physician Assistants and Nurse Practitioners) who all work together to provide you with the care you need, when you need it.  We recommend signing up for the patient portal called "MyChart".  Sign up information is provided on this After Visit Summary.  MyChart is used to connect with patients for Virtual Visits (Telemedicine).  Patients are able to view lab/test results, encounter notes, upcoming appointments, etc.  Non-urgent messages can be sent to your provider as well.   To learn more about what you can do with  MyChart, go to [email or web address].    Your next appointment:   8 month(s)  The format for your next appointment:   In Person  Provider:   Laurance Flatten, MD       Signed, Meriam Sprague, MD  07/12/2020 5:14 PM    [facility]

## 2020-07-12 ENCOUNTER — Ambulatory Visit: Payer: PPO | Admitting: Cardiology

## 2020-07-12 ENCOUNTER — Other Ambulatory Visit: Payer: Self-pay

## 2020-07-12 ENCOUNTER — Encounter: Payer: Self-pay | Admitting: Cardiology

## 2020-07-12 VITALS — BP 120/84 | HR 66 | Ht 68.0 in | Wt 151.4 lb

## 2020-07-12 DIAGNOSIS — I25118 Atherosclerotic heart disease of native coronary artery with other forms of angina pectoris: Secondary | ICD-10-CM

## 2020-07-12 DIAGNOSIS — I10 Essential (primary) hypertension: Secondary | ICD-10-CM

## 2020-07-12 DIAGNOSIS — I340 Nonrheumatic mitral (valve) insufficiency: Secondary | ICD-10-CM | POA: Diagnosis not present

## 2020-07-12 DIAGNOSIS — I252 Old myocardial infarction: Secondary | ICD-10-CM

## 2020-07-12 DIAGNOSIS — E785 Hyperlipidemia, unspecified: Secondary | ICD-10-CM | POA: Diagnosis not present

## 2020-07-12 NOTE — Patient Instructions (Signed)
Medication Instructions:   Your physician recommends that you continue on your current medications as directed. Please refer to the Current Medication list given to you today.  *If you need a refill on your cardiac medications before your next appointment, please call your pharmacy*   Lab Work:  PLEASE OBTAIN YOUR RECENT LABS (LDL/LIPIDS) FROM YOUR PRIMARY CARE PHYSICIAN AND SEND THEM TO [place].  THEY CAN FAX THEM TO OUR OFFICE AT [phone] ATTENTION DR. Shari Prows, OR YOU CAN SCAN THEM TO [place] VIA YOUR ACTIVE MYCHART ACCOUNT.   If you have labs (blood work) drawn today and your tests are completely normal, you will receive your results only by: Marland Kitchen MyChart Message (if you have MyChart) OR . A paper copy in the mail If you have any lab test that is abnormal or we need to change your treatment, we will call you to review the results.   Testing/Procedures:  Your physician has requested that you have an echocardiogram. Echocardiography is a painless test that uses sound waves to create images of your heart. It provides your doctor with information about the size and shape of your heart and how well your heart's chambers and valves are working. This procedure takes approximately one hour. There are no restrictions for this procedure.   Follow-Up: At [facility], you and your health needs are our priority.  As part of our continuing mission to provide you with exceptional heart care, we have created designated Provider Care Teams.  These Care Teams include your primary Cardiologist (physician) and Advanced Practice Providers (APPs -  Physician Assistants and Nurse Practitioners) who all work together to provide you with the care you need, when you need it.  We recommend signing up for the patient portal called "MyChart".  Sign up information is provided on this After Visit Summary.  MyChart is used to connect with patients for Virtual Visits (Telemedicine).  Patients are able to view lab/test  results, encounter notes, upcoming appointments, etc.  Non-urgent messages can be sent to your provider as well.   To learn more about what you can do with MyChart, go to [email or web address].    Your next appointment:   8 month(s)  The format for your next appointment:   In Person  Provider:   Laurance Flatten, MD

## 2020-07-31 DIAGNOSIS — R7301 Impaired fasting glucose: Secondary | ICD-10-CM | POA: Diagnosis not present

## 2020-07-31 DIAGNOSIS — E7800 Pure hypercholesterolemia, unspecified: Secondary | ICD-10-CM | POA: Diagnosis not present

## 2020-07-31 DIAGNOSIS — N182 Chronic kidney disease, stage 2 (mild): Secondary | ICD-10-CM | POA: Diagnosis not present

## 2020-07-31 DIAGNOSIS — I2510 Atherosclerotic heart disease of native coronary artery without angina pectoris: Secondary | ICD-10-CM | POA: Diagnosis not present

## 2020-08-07 ENCOUNTER — Other Ambulatory Visit: Payer: Self-pay

## 2020-08-07 ENCOUNTER — Ambulatory Visit (HOSPITAL_COMMUNITY): Payer: PPO | Attending: Internal Medicine

## 2020-08-07 DIAGNOSIS — I340 Nonrheumatic mitral (valve) insufficiency: Secondary | ICD-10-CM | POA: Diagnosis not present

## 2020-08-07 LAB — ECHOCARDIOGRAM COMPLETE
Area-P 1/2: 3.19 cm2
S' Lateral: 2.6 cm

## 2020-08-27 ENCOUNTER — Other Ambulatory Visit: Payer: Self-pay | Admitting: Student

## 2020-08-27 NOTE — Telephone Encounter (Signed)
Refill Request.  

## 2020-09-12 ENCOUNTER — Other Ambulatory Visit: Payer: Self-pay

## 2020-09-12 ENCOUNTER — Encounter: Payer: Self-pay | Admitting: Emergency Medicine

## 2020-09-12 ENCOUNTER — Ambulatory Visit
Admission: EM | Admit: 2020-09-12 | Discharge: 2020-09-12 | Disposition: A | Discharge status: Home / Self Care | Payer: PPO | Attending: Family Medicine | Admitting: Family Medicine

## 2020-09-12 DIAGNOSIS — S161XXA Strain of muscle, fascia and tendon at neck level, initial encounter: Secondary | ICD-10-CM

## 2020-09-12 MED ORDER — CYCLOBENZAPRINE HCL 5 MG PO TABS: 5.0000 mg | ORAL_TABLET | Freq: Three times a day (TID) | ORAL | 0 refills | Status: DC | PRN

## 2020-09-12 MED ORDER — PREDNISONE 10 MG (21) PO TBPK: ORAL_TABLET | Freq: Every day | ORAL | 0 refills | Status: AC

## 2020-09-12 NOTE — Discharge Instructions (Addendum)
I have sent in a prednisone taper for you to take for 6 days. 6 tablets on day one, 5 tablets on day two, 4 tablets on day three, 3 tablets on day four, 2 tablets on day five, and 1 tablet on day six.  I have sent in flexeril for you to take twice a day as needed for muscle spasms. This medication can make you sleepy. Do not drive or operate heavy machinery with this medication.  Follow up with this office or with primary care if symptoms are persisting.  Follow up in the ER for high fever, trouble swallowing, trouble breathing, other concerning symptoms.  

## 2020-09-12 NOTE — ED Triage Notes (Addendum)
Pain to LT side of neck and LT shoulder x 3 days.  Pt had same episode with same s/s last year and states the  prescriptions she was given helped.

## 2020-09-12 NOTE — ED Provider Notes (Signed)
[facility]    CSN: [id number] Arrival date & time: 09/12/20  1058      History   Chief Complaint Chief Complaint  Patient presents with  . Neck Pain    HPI Kayla [name] is a 80 y.o. female.   Reports left neck and shoulder pain for the last 3 days.  Has taken half tablets of home tramadol with some temporary relief.  Has used heat to the area, also with temporary relief.  States that she has had this happen in the past and was seen and treated in this office with relief.  Cannot recall prescriptions.  Denies injury, denies numbness, tingling, radiating pain, other symptoms.  ROS per HPI  The history is provided by the patient.  Neck Pain   Past Medical History:  Diagnosis Date  . Hyperlipidemia   . Hypertension   . Ischemic heart disease   . Myocardial infarction (HCC) 2003  . Osteopenia 01/2019   T score -1.1 stable from prior DEXA    Patient Active Problem List   Diagnosis Date Noted  . S/P laparoscopic cholecystectomyAugust2020 11/29/2018  . Benign hypertensive heart disease without heart failure 03/01/2013  . Vaginal atrophy 05/03/2012  . Myocardial infarction (HCC)   . Hyperlipidemia   . Ischemic heart disease   . 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 Breast cyst 02/27/2011  . Hyperkalemia 01/28/2011  . Old MI (myocardial infarction) 08/08/2010  . Hypercholesterolemia 08/08/2010  . Osteoporosis 08/08/2010    Past Surgical History:  Procedure Laterality Date  . ABDOMINAL HYSTERECTOMY  1984   TAH bleeding adenomyosis  . BREAST SURGERY     Breast bx  . CATARACT EXTRACTION W/ INTRAOCULAR LENS IMPLANT Bilateral 11/2017  . CHOLECYSTECTOMY N/A 11/29/2018   Procedure: LAPAROSCOPIC CHOLECYSTECTOMY WITH INTRAOPERATIVE CHOLANGIOGRAM;  Surgeon: Luretha MurphyMartin, Matthew, MD;  Location: [facility];  Service: General;  Laterality: N/A;    OB History    Gravida  2   Para  2   Term  2   Preterm      AB      Living  2     SAB      IAB      Ectopic       Multiple      Live Births               Home Medications    Prior to Admission medications   Medication Sig Start Date End Date Taking? Authorizing Provider  cyclobenzaprine (FLEXERIL) 5 MG tablet Take 1 tablet (5 mg total) by mouth 3 (three) times daily as needed for muscle spasms. 09/12/20  Yes Moshe CiproMatthews, Kayd Launer, NP  predniSONE (STERAPRED UNI-PAK 21 TAB) 10 MG (21) TBPK tablet Take by mouth daily for 6 days. Take 6 tablets on day 1, 5 tablets on day 2, 4 tablets on day 3, 3 tablets on day 4, 2 tablets on day 5, 1 tablet on day 6 09/12/20 09/18/20 Yes Moshe CiproMatthews, Damyan Corne, NP  Ascorbic Acid (VITA-C PO) Take 1,000 mg by mouth in the morning and at bedtime.    [provider]  aspirin 81 MG tablet Take 81 mg by mouth daily.    [provider]  Calcium Carbonate (CALTRATE 600 PO) Take by mouth. Per patient taking 1 tablet twice a day    [provider]  Calcium-Vitamin D (POSTURE-D PO) Take 1 tablet by mouth 2 (two) times daily.    [provider]  Cholecalciferol (VITAMIN D) 1000 UNITS capsule Take 1,000 Units  by mouth daily.    [provider]  clopidogrel (PLAVIX) 75 MG tablet TAKE (1) TABLET BY MOUTH ONCE DAILY. 10/13/19   Laqueta Linden, MD  Coenzyme Q10 (COQ10) 100 MG CAPS Take 100 mg by mouth daily.    [provider]  estradiol (ESTRACE) 0.1 MG/GM vaginal cream Place 1 Applicatorful vaginally 2 (two) times a week. Per patient not sure of dosage    [provider]  Methylcellulose, Laxative, (CITRUCEL PO) Take 1 packet by mouth at bedtime.    [provider]  metoprolol tartrate (LOPRESSOR) 50 MG tablet TAKE (1/2) TABLET BY MOUTH TWICE DAILY. 08/27/20   Meriam Sprague, MD  nitroGLYCERIN (NITROSTAT) 0.4 MG SL tablet Place 1 tablet (0.4 mg total) under the tongue every 5 (five) minutes as needed for chest pain. 01/26/17   Laqueta Linden, MD  NONFORMULARY OR COMPOUNDED ITEM Estradiol vaginal cream  0.02% insert 1 applicator twice weekly 06/13/20   Theresia Majors, MD  Omega-3 Fatty Acids (FISH OIL) 1200 MG CAPS Take 1,200 mg by mouth daily.    [provider]  Probiotic Product (ALIGN) 4 MG CAPS Take 4 mg by mouth daily.    [provider]  rosuvastatin (CRESTOR) 10 MG tablet TAKE 1 TABLET BY MOUTH DAILY. 05/15/20   Hillis Range, MD    Family History Family History  Problem Relation Age of Onset  . Hypertension Mother   . Heart disease Mother   . Osteoporosis Mother   . Heart disease Father   . Colon cancer Father   . Breast cancer Maternal Grandmother        Age unknown  . Cancer Paternal Grandmother        Unknown type    Social History Social History   Tobacco Use  . Smoking status: Never Smoker  . Smokeless tobacco: Never Used  Vaping Use  . Vaping Use: Never used  Substance Use Topics  . Alcohol use: No    Alcohol/week: 0.0 standard drinks  . Drug use: No     Allergies   Codeine, Lipitor [atorvastatin calcium], and Simvastatin   Review of Systems Review of Systems  Musculoskeletal: Positive for neck pain.     Physical Exam Triage Vital Signs ED Triage Vitals  Enc Vitals Group     BP 09/12/20 1146 116/71     Pulse Rate 09/12/20 1146 64     Resp 09/12/20 1146 17     Temp 09/12/20 1146 98 F (36.7 C)     Temp Source 09/12/20 1146 Oral     SpO2 09/12/20 1146 98 %     Weight --      Height --      Head Circumference --      Peak Flow --      Pain Score 09/12/20 1144 7     Pain Loc --      Pain Edu? --      Excl. in GC? --    No data found.  Updated Vital Signs BP 116/71 (BP Location: Right Arm)   Pulse 64   Temp 98 F (36.7 C) (Oral)   Resp 17   SpO2 98%    Physical Exam Vitals and nursing note reviewed.  Constitutional:      General: She is not in acute distress.    Appearance: Normal appearance. She is well-developed and normal weight. She is not ill-appearing.  HENT:     Head: Normocephalic and atraumatic.  Nose: Nose normal.     Mouth/Throat:     Mouth: Mucous membranes are moist.     Pharynx: Oropharynx is clear.  Eyes:     Extraocular Movements: Extraocular movements intact.     Conjunctiva/sclera: Conjunctivae normal.     Pupils: Pupils are equal, round, and reactive to light.  Cardiovascular:     Rate and Rhythm: Normal rate and regular rhythm.     Heart sounds: No murmur heard.   Pulmonary:     Effort: Pulmonary effort is normal. No respiratory distress.     Breath sounds: Normal breath sounds.  Abdominal:     Palpations: Abdomen is soft.     Tenderness: There is no abdominal tenderness.  Musculoskeletal:        General: Swelling and tenderness present.     Cervical back: Normal range of motion and neck supple.     Comments: Left posterior neck, left trapezius and spasm, tender to touch  Skin:    General: Skin is warm and dry.     Capillary Refill: Capillary refill takes less than 2 seconds.  Neurological:     General: No focal deficit present.     Mental Status: She is alert and oriented to person, place, and time.  Psychiatric:        Mood and Affect: Mood normal.        Behavior: Behavior normal.        Thought Content: Thought content normal.      UC Treatments / Results  Labs (all labs ordered are listed, but only abnormal results are displayed) Labs Reviewed - No data to display  EKG   Radiology No results found.  Procedures Procedures (including critical care time)  Medications Ordered in UC Medications - No data to display  Initial Impression / Assessment and Plan / UC Course  I have reviewed the triage vital signs and the nursing notes.  Pertinent labs & imaging results that were available during my care of the patient were reviewed by me and considered in my medical decision making (see chart for details).    Cervical strain  Prednisone taper prescribed Flexeril prescribed Sedation precautions given Follow up with this office or with  primary care if symptoms are persisting.  Follow up in the ER for high fever, trouble swallowing, trouble breathing, other concerning symptoms.   Final Clinical Impressions(s) / UC Diagnoses   Final diagnoses:  Strain of neck muscle, initial encounter     Discharge Instructions     I have sent in a prednisone taper for you to take for 6 days. 6 tablets on day one, 5 tablets on day two, 4 tablets on day three, 3 tablets on day four, 2 tablets on day five, and 1 tablet on day six.  I have sent in flexeril for you to take twice a day as needed for muscle spasms. This medication can make you sleepy. Do not drive or operate heavy machinery with this medication.  Follow up with this office or with primary care if symptoms are persisting.  Follow up in the ER for high fever, trouble swallowing, trouble breathing, other concerning symptoms.     ED Prescriptions    Medication Sig Dispense Auth. Provider   predniSONE (STERAPRED UNI-PAK 21 TAB) 10 MG (21) TBPK tablet Take by mouth daily for 6 days. Take 6 tablets on day 1, 5 tablets on day 2, 4 tablets on day 3, 3 tablets on day 4, 2 tablets on day 5,  1 tablet on day 6 21 tablet Moshe Cipro, NP   cyclobenzaprine (FLEXERIL) 5 MG tablet Take 1 tablet (5 mg total) by mouth 3 (three) times daily as needed for muscle spasms. 30 tablet Moshe Cipro, NP     PDMP not reviewed this encounter.   Moshe Cipro, NP 09/12/20 1201

## 2020-09-19 ENCOUNTER — Encounter: Payer: Self-pay | Admitting: Emergency Medicine

## 2020-09-19 ENCOUNTER — Other Ambulatory Visit: Payer: Self-pay

## 2020-09-19 ENCOUNTER — Ambulatory Visit
Admission: EM | Admit: 2020-09-19 | Discharge: 2020-09-19 | Disposition: A | Discharge status: Home / Self Care | Payer: PPO

## 2020-09-19 DIAGNOSIS — S46811D Strain of other muscles, fascia and tendons at shoulder and upper arm level, right arm, subsequent encounter: Secondary | ICD-10-CM

## 2020-09-19 DIAGNOSIS — S30860A Insect bite (nonvenomous) of lower back and pelvis, initial encounter: Secondary | ICD-10-CM | POA: Diagnosis not present

## 2020-09-19 DIAGNOSIS — W57XXXA Bitten or stung by nonvenomous insect and other nonvenomous arthropods, initial encounter: Secondary | ICD-10-CM

## 2020-09-19 NOTE — Discharge Instructions (Addendum)
We removed the tick from the back in the office today  I have attached information of symptoms to look for about when to follow-up  I would try butterfly pillow for your neck pain  Follow up with this office or with primary care if symptoms are persisting.  Follow up in the ER for high fever, trouble swallowing, trouble breathing, other concerning symptoms.

## 2020-09-19 NOTE — ED Triage Notes (Signed)
Has a tick embedded in her back.

## 2020-09-19 NOTE — ED Notes (Signed)
Tick removed from mid back area.

## 2020-09-19 NOTE — ED Provider Notes (Signed)
[facility]    CSN: [id number] Arrival date & time: 09/19/20  1696      History   Chief Complaint No chief complaint on file.   HPI Kayla [name] is a 80 y.o. female.   Reports that she had a place that was itching on her back, and states that she found a tick to the area.  [place] and has been outside a lot lately.  Has not attempted OTC treatment.  States she cannot reach the tick to remove it.  Also reports that she is still having some posterior left shoulder pain.  She was seen in this office a week ago and treated with prednisone and muscle relaxers.  States that the pain is better, but is still there some.  Inquiring about whether it is her sleeping positions is causing her pain.  Denies numbness, tingling, loss of strength to extremities, draining or bleeding from the site of the bite, fatigue, fever, nausea, vomiting, diarrhea, rash, other symptoms.  ROS per HPI     Past Medical History:  Diagnosis Date  . Hyperlipidemia   . Hypertension   . Ischemic heart disease   . Myocardial infarction (HCC) 2003  . Osteopenia 01/2019   T score -1.1 stable from prior DEXA    Patient Active Problem List   Diagnosis Date Noted  . S/P laparoscopic cholecystectomyAugust2020 11/29/2018  . Benign hypertensive heart disease without heart failure 03/01/2013  . Vaginal atrophy 05/03/2012  . Myocardial infarction (HCC)   . Hyperlipidemia   . Ischemic heart disease   . 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 Breast cyst 02/27/2011  . Hyperkalemia 01/28/2011  . Old MI (myocardial infarction) 08/08/2010  . Hypercholesterolemia 08/08/2010  . Osteoporosis 08/08/2010    Past Surgical History:  Procedure Laterality Date  . ABDOMINAL HYSTERECTOMY  1984   TAH bleeding adenomyosis  . BREAST SURGERY     Breast bx  . CATARACT EXTRACTION W/ INTRAOCULAR LENS IMPLANT Bilateral 11/2017  . CHOLECYSTECTOMY N/A 11/29/2018   Procedure: LAPAROSCOPIC  CHOLECYSTECTOMY WITH INTRAOPERATIVE CHOLANGIOGRAM;  Surgeon: Luretha Murphy, MD;  Location: [facility];  Service: General;  Laterality: N/A;    OB History    Gravida  2   Para  2   Term  2   Preterm      AB      Living  2     SAB      IAB      Ectopic      Multiple      Live Births               Home Medications    Prior to Admission medications   Medication Sig Start Date End Date Taking? Authorizing Provider  Ascorbic Acid (VITA-C PO) Take 1,000 mg by mouth in the morning and at bedtime.    [provider]  aspirin 81 MG tablet Take 81 mg by mouth daily.    [provider]  Calcium Carbonate (CALTRATE 600 PO) Take by mouth. Per patient taking 1 tablet twice a day    [provider]  Calcium-Vitamin D (POSTURE-D PO) Take 1 tablet by mouth 2 (two) times daily.    [provider]  Cholecalciferol (VITAMIN D) 1000 UNITS capsule Take 1,000 Units by mouth daily.    [provider]  clopidogrel (PLAVIX) 75 MG tablet TAKE (1) TABLET BY MOUTH ONCE DAILY. 10/13/19   Laqueta Linden, MD  Coenzyme Q10 (COQ10) 100  MG CAPS Take 100 mg by mouth daily.    [provider]  cyclobenzaprine (FLEXERIL) 5 MG tablet Take 1 tablet (5 mg total) by mouth 3 (three) times daily as needed for muscle spasms. 09/12/20   Moshe Cipro, NP  estradiol (ESTRACE) 0.1 MG/GM vaginal cream Place 1 Applicatorful vaginally 2 (two) times a week. Per patient not sure of dosage    [provider]  Methylcellulose, Laxative, (CITRUCEL PO) Take 1 packet by mouth at bedtime.    [provider]  metoprolol tartrate (LOPRESSOR) 50 MG tablet TAKE (1/2) TABLET BY MOUTH TWICE DAILY. 08/27/20   Meriam Sprague, MD  nitroGLYCERIN (NITROSTAT) 0.4 MG SL tablet Place 1 tablet (0.4 mg total) under the tongue every 5 (five) minutes as needed for chest pain. 01/26/17   Laqueta Linden, MD  NONFORMULARY OR COMPOUNDED ITEM Estradiol vaginal  cream 0.02% insert 1 applicator twice weekly 06/13/20   Theresia Majors, MD  Omega-3 Fatty Acids (FISH OIL) 1200 MG CAPS Take 1,200 mg by mouth daily.    [provider]  Probiotic Product (ALIGN) 4 MG CAPS Take 4 mg by mouth daily.    [provider]  rosuvastatin (CRESTOR) 10 MG tablet TAKE 1 TABLET BY MOUTH DAILY. 05/15/20   Hillis Range, MD    Family History Family History  Problem Relation Age of Onset  . Hypertension Mother   . Heart disease Mother   . Osteoporosis Mother   . Heart disease Father   . Colon cancer Father   . Breast cancer Maternal Grandmother        Age unknown  . Cancer Paternal Grandmother        Unknown type    Social History Social History   Tobacco Use  . Smoking status: Never Smoker  . Smokeless tobacco: Never Used  Vaping Use  . Vaping Use: Never used  Substance Use Topics  . Alcohol use: No    Alcohol/week: 0.0 standard drinks  . Drug use: No     Allergies   Codeine, Lipitor [atorvastatin calcium], and Simvastatin   Review of Systems Review of Systems   Physical Exam Triage Vital Signs ED Triage Vitals  Enc Vitals Group     BP 09/19/20 0822 128/81     Pulse Rate 09/19/20 0822 87     Resp 09/19/20 0822 18     Temp 09/19/20 0822 98.1 F (36.7 C)     Temp Source 09/19/20 0822 Oral     SpO2 09/19/20 0822 97 %     Weight --      Height --      Head Circumference --      Peak Flow --      Pain Score 09/19/20 0824 0     Pain Loc --      Pain Edu? --      Excl. in GC? --    No data found.  Updated Vital Signs BP 128/81 (BP Location: Right Arm)   Pulse 87   Temp 98.1 F (36.7 C) (Oral)   Resp 18   SpO2 97%   Visual Acuity Right Eye Distance:   Left Eye Distance:   Bilateral Distance:    Right Eye Near:   Left Eye Near:    Bilateral Near:     Physical Exam Vitals and nursing note reviewed.  Constitutional:      General: She is not in acute distress.    Appearance: Normal appearance. She is  well-developed and  normal weight. She is not ill-appearing.  HENT:     Head: Normocephalic and atraumatic.     Nose: Nose normal.     Mouth/Throat:     Mouth: Mucous membranes are moist.     Pharynx: Oropharynx is clear.  Eyes:     Extraocular Movements: Extraocular movements intact.     Conjunctiva/sclera: Conjunctivae normal.     Pupils: Pupils are equal, round, and reactive to light.  Cardiovascular:     Rate and Rhythm: Normal rate and regular rhythm.     Heart sounds: Normal heart sounds.  Pulmonary:     Effort: Pulmonary effort is normal.  Musculoskeletal:        General: Normal range of motion.     Cervical back: Normal range of motion and neck supple. Tenderness present.  Skin:    General: Skin is warm and dry.     Capillary Refill: Capillary refill takes less than 2 seconds.     Findings: Lesion present.       Neurological:     General: No focal deficit present.     Mental Status: She is alert and oriented to person, place, and time.  Psychiatric:        Mood and Affect: Mood normal.        Behavior: Behavior normal.        Thought Content: Thought content normal.      UC Treatments / Results  Labs (all labs ordered are listed, but only abnormal results are displayed) Labs Reviewed - No data to display  EKG   Radiology No results found.  Procedures Procedures (including critical care time)  Medications Ordered in UC Medications - No data to display  Initial Impression / Assessment and Plan / UC Course  I have reviewed the triage vital signs and the nursing notes.  Pertinent labs & imaging results that were available during my care of the patient were reviewed by me and considered in my medical decision making (see chart for details).    Tick Bite of Back Strain of R trapezius muscle  Tick bite information handout given Tick bite removed in office with tweezers Follow-up with any drainage, increased erythema, tenderness, warmth from the  area May try to use a butterfly pillow for trapezius strain if you are still having issues Follow-up with PCP or this office as needed  Final Clinical Impressions(s) / UC Diagnoses   Final diagnoses:  Tick bite of back, initial encounter  Strain of right trapezius muscle, subsequent encounter     Discharge Instructions     We removed the tick from the back in the office today  I have attached information of symptoms to look for about when to follow-up  I would try butterfly pillow for your neck pain  Follow up with this office or with primary care if symptoms are persisting.  Follow up in the ER for high fever, trouble swallowing, trouble breathing, other concerning symptoms.     ED Prescriptions    None     PDMP not reviewed this encounter.   Moshe Cipro, NP 09/19/20 [phone]

## 2020-09-20 DIAGNOSIS — R0789 Other chest pain: Secondary | ICD-10-CM | POA: Diagnosis not present

## 2020-09-20 DIAGNOSIS — S20469A Insect bite (nonvenomous) of unspecified back wall of thorax, initial encounter: Secondary | ICD-10-CM | POA: Diagnosis not present

## 2020-10-02 DIAGNOSIS — I2510 Atherosclerotic heart disease of native coronary artery without angina pectoris: Secondary | ICD-10-CM | POA: Diagnosis not present

## 2020-11-12 ENCOUNTER — Other Ambulatory Visit: Payer: Self-pay | Admitting: Internal Medicine

## 2020-12-04 DIAGNOSIS — Z0000 Encounter for general adult medical examination without abnormal findings: Secondary | ICD-10-CM | POA: Diagnosis not present

## 2020-12-04 DIAGNOSIS — Z1331 Encounter for screening for depression: Secondary | ICD-10-CM | POA: Diagnosis not present

## 2020-12-04 DIAGNOSIS — E7800 Pure hypercholesterolemia, unspecified: Secondary | ICD-10-CM | POA: Diagnosis not present

## 2020-12-04 DIAGNOSIS — I2510 Atherosclerotic heart disease of native coronary artery without angina pectoris: Secondary | ICD-10-CM | POA: Diagnosis not present

## 2020-12-04 DIAGNOSIS — N182 Chronic kidney disease, stage 2 (mild): Secondary | ICD-10-CM | POA: Diagnosis not present

## 2021-01-10 ENCOUNTER — Other Ambulatory Visit: Payer: Self-pay | Admitting: Cardiology

## 2021-02-06 DIAGNOSIS — Z23 Encounter for immunization: Secondary | ICD-10-CM | POA: Diagnosis not present

## 2021-02-19 DIAGNOSIS — R52 Pain, unspecified: Secondary | ICD-10-CM | POA: Diagnosis not present

## 2021-02-19 DIAGNOSIS — G8911 Acute pain due to trauma: Secondary | ICD-10-CM | POA: Diagnosis not present
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
## 2021-03-13 ENCOUNTER — Other Ambulatory Visit: Payer: Self-pay

## 2021-03-13 ENCOUNTER — Encounter: Payer: Self-pay | Admitting: Nurse Practitioner

## 2021-03-13 ENCOUNTER — Ambulatory Visit: Payer: PPO | Admitting: Nurse Practitioner

## 2021-03-13 VITALS — BP 130/82 | HR 58 | Ht 68.0 in | Wt 158.0 lb

## 2021-03-13 DIAGNOSIS — I252 Old myocardial infarction: Secondary | ICD-10-CM

## 2021-03-13 DIAGNOSIS — E785 Hyperlipidemia, unspecified: Secondary | ICD-10-CM

## 2021-03-13 DIAGNOSIS — I340 Nonrheumatic mitral (valve) insufficiency: Secondary | ICD-10-CM | POA: Diagnosis not present

## 2021-03-13 DIAGNOSIS — I10 Essential (primary) hypertension: Secondary | ICD-10-CM

## 2021-03-13 DIAGNOSIS — I2510 Atherosclerotic heart disease of native coronary artery without angina pectoris: Secondary | ICD-10-CM | POA: Diagnosis not present

## 2021-03-13 DIAGNOSIS — R0989 Other specified symptoms and signs involving the circulatory and respiratory systems: Secondary | ICD-10-CM

## 2021-03-13 NOTE — Patient Instructions (Signed)
Medication Instructions:  Your physician recommends that you continue on your current medications as directed. Please refer to the Current Medication list given to you today.  *If you need a refill on your cardiac medications before your next appointment, please call your pharmacy*   Lab Work: None If you have labs (blood work) drawn today and your tests are completely normal, you will receive your results only by: MyChart Message (if you have MyChart) OR A paper copy in the mail If you have any lab test that is abnormal or we need to change your treatment, we will call you to review the results.   Testing/Procedures: Your physician has requested that you have a carotid duplex. This test is an ultrasound of the carotid arteries in your neck. It looks at blood flow through these arteries that supply the brain with blood. Allow one hour for this exam. There are no restrictions or special instructions.    Follow-Up: At [facility], you and your health needs are our priority.  As part of our continuing mission to provide you with exceptional heart care, we have created designated Provider Care Teams.  These Care Teams include your primary Cardiologist (physician) and Advanced Practice Providers (APPs -  Physician Assistants and Nurse Practitioners) who all work together to provide you with the care you need, when you need it.   Your next appointment:   6-8 month(s)  The format for your next appointment:   In Person  Provider:   Meriam Sprague, MD {   Other Instructions Check your blood pressure at home   HOW TO TAKE YOUR BLOOD PRESSURE: Rest 5 minutes before taking your blood pressure.  Don't smoke or drink caffeinated beverages for at least 30 minutes before. Take your blood pressure before (not after) you eat. Sit comfortably with your back supported and both feet on the floor (don't cross your legs). Elevate your arm to heart level on a table or a desk. Use the proper  sized cuff. It should fit smoothly and snugly around your bare upper arm. There should be enough room to slip a fingertip under the cuff. The bottom edge of the cuff should be 1 inch above the crease of the elbow.

## 2021-03-16 ENCOUNTER — Other Ambulatory Visit: Payer: Self-pay

## 2021-03-16 ENCOUNTER — Telehealth: Payer: Self-pay | Admitting: Physician Assistant

## 2021-03-16 ENCOUNTER — Ambulatory Visit
Admission: EM | Admit: 2021-03-16 | Discharge: 2021-03-16 | Disposition: A | Discharge status: Home / Self Care | Payer: PPO | Attending: Physician Assistant | Admitting: Physician Assistant

## 2021-03-16 DIAGNOSIS — J069 Acute upper respiratory infection, unspecified: Secondary | ICD-10-CM

## 2021-03-16 DIAGNOSIS — Z20822 Contact with and (suspected) exposure to covid-19: Secondary | ICD-10-CM

## 2021-03-16 DIAGNOSIS — R051 Acute cough: Secondary | ICD-10-CM

## 2021-03-16 MED ORDER — PROMETHAZINE-PHENYLEPHRINE 6.25-5 MG/5ML PO SYRP: 5.0000 mL | ORAL_SOLUTION | ORAL | 0 refills | Status: DC | PRN

## 2021-03-16 NOTE — ED Triage Notes (Signed)
Patient states on Wednesday afternoon she thought she was having sinus problems but symptoms have gotten worse with runny nose, sore throat, a dry cough and a sore throat. She is taking OTC for sinuses and Tylenol. Last dose yesterday.  Denies Exposure Denies Fever

## 2021-03-16 NOTE — ED Provider Notes (Signed)
[facility]    CSN: [id number] Arrival date & time: 03/16/21  [id number]      History   Chief Complaint No chief complaint on file.   HPI Kayla [name] is a 80 y.o. female.   PT complains of congestion, headache, sore throat, and nonproductive cough that started about 4 days.  Denies body aches, shortness of breath, n/v/d.  Denies sick contacts.  She has had her flu shot.  She is taking cold and sinus medication with some relief as well as Tylenol with relief.  She denies sick contacts.    Past Medical History:  Diagnosis Date   Hyperlipidemia    Hypertension    Ischemic heart disease    Myocardial infarction ([place]) 2003   Osteopenia 01/2019   T score -1.1 stable from prior DEXA    Patient Active Problem List   Diagnosis Date Noted   S/P laparoscopic cholecystectomyAugust2020 11/29/2018   Benign hypertensive heart disease without heart failure 03/01/2013   Vaginal atrophy 05/03/2012   Myocardial infarction [facility])    Hyperlipidemia    Ischemic heart disease    Senile osteoporosis 02/27/2011   Breast cyst 02/27/2011   Hyperkalemia 01/28/2011   Old MI (myocardial infarction) 08/08/2010   Hypercholesterolemia 08/08/2010   Osteoporosis 08/08/2010    Past Surgical History:  Procedure Laterality Date   ABDOMINAL HYSTERECTOMY  1984   TAH bleeding adenomyosis   BREAST SURGERY     Breast bx   CATARACT EXTRACTION W/ INTRAOCULAR LENS IMPLANT Bilateral 11/2017   CHOLECYSTECTOMY N/A 11/29/2018   Procedure: LAPAROSCOPIC CHOLECYSTECTOMY WITH INTRAOPERATIVE CHOLANGIOGRAM;  Surgeon: Johnathan Hausen, MD;  Location: [facility];  Service: General;  Laterality: N/A;    OB History     Gravida  2   Para  2   Term  2   Preterm      AB      Living  2      SAB      IAB      Ectopic      Multiple      Live Births               Home Medications    Prior to Admission medications   Medication Sig Start Date End Date Taking? Authorizing Provider  Ascorbic  Acid (VITA-C PO) Take 1,000 mg by mouth in the morning and at bedtime.    [provider]  aspirin 81 MG tablet Take 81 mg by mouth daily.    [provider]  Calcium Carbonate (CALTRATE 600 PO) Take by mouth. Per patient taking 1 tablet twice a day    [provider]  Calcium-Vitamin D (POSTURE-D PO) Take 1 tablet by mouth 2 (two) times daily.    [provider]  Cholecalciferol (VITAMIN D) 1000 UNITS capsule Take 1,000 Units by mouth daily.    [provider]  clopidogrel (PLAVIX) 75 MG tablet TAKE (1) TABLET BY MOUTH ONCE DAILY. 01/10/21   Freada Bergeron, MD  Coenzyme Q10 (COQ10) 100 MG CAPS Take 100 mg by mouth daily.    [provider]  cyclobenzaprine (FLEXERIL) 5 MG tablet Take 1 tablet (5 mg total) by mouth 3 (three) times daily as needed for muscle spasms. Patient not taking: Reported on 03/13/2021 09/12/20   Faustino Congress, NP  estradiol (ESTRACE) 0.1 MG/GM vaginal cream Place 1 Applicatorful vaginally 2 (two) times a week. Per patient not sure of dosage    [provider]  Methylcellulose, Laxative, (CITRUCEL  PO) Take 1 packet by mouth at bedtime.    [provider]  metoprolol tartrate (LOPRESSOR) 50 MG tablet TAKE (1/2) TABLET BY MOUTH TWICE DAILY. 08/27/20   Freada Bergeron, MD  nitroGLYCERIN (NITROSTAT) 0.4 MG SL tablet Place 1 tablet (0.4 mg total) under the tongue every 5 (five) minutes as needed for chest pain. 01/26/17   Herminio Commons, MD  NONFORMULARY OR COMPOUNDED ITEM Estradiol vaginal cream [id number] insert 1 applicator twice weekly 06/13/20   Joseph Pierini, MD  Omega-3 Fatty Acids (FISH OIL) 1200 MG CAPS Take 1,200 mg by mouth daily.    [provider]  Probiotic Product (ALIGN) 4 MG CAPS Take 4 mg by mouth daily. Patient not taking: Reported on 03/13/2021    [provider]  rosuvastatin (CRESTOR) 10 MG tablet TAKE 1 TABLET BY MOUTH DAILY. 11/13/20   Freada Bergeron,  MD    Family History Family History  Problem Relation Age of Onset   Hypertension Mother    Heart disease Mother    Osteoporosis Mother    Heart disease Father    Colon cancer Father    Breast cancer Maternal Grandmother        Age unknown   Cancer Paternal Grandmother        Unknown type    Social History Social History   Tobacco Use   Smoking status: Never   Smokeless tobacco: Never  Vaping Use   Vaping Use: Never used  Substance Use Topics   Alcohol use: No    Alcohol/week: 0.0 standard drinks   Drug use: No     Allergies   Codeine, Lipitor [atorvastatin calcium], and Simvastatin   Review of Systems Review of Systems  Constitutional:  Negative for chills and fever.  HENT:  Positive for congestion, rhinorrhea and sore throat. Negative for ear pain.   Eyes:  Negative for pain and visual disturbance.  Respiratory:  Positive for cough. Negative for shortness of breath and wheezing.   Cardiovascular:  Negative for chest pain and palpitations.  Gastrointestinal:  Negative for abdominal pain and vomiting.  Genitourinary:  Negative for dysuria and hematuria.  Musculoskeletal:  Negative for arthralgias and back pain.  Skin:  Negative for color change and rash.  Neurological:  Negative for seizures and syncope.  All other systems reviewed and are negative.   Physical Exam Triage Vital Signs ED Triage Vitals  Enc Vitals Group     BP 03/16/21 0856 115/75     Pulse Rate 03/16/21 0856 80     Resp 03/16/21 0856 16     Temp 03/16/21 0856 98.2 F (36.8 C)     Temp src --      SpO2 03/16/21 0856 97 %     Weight --      Height --      Head Circumference --      Peak Flow --      Pain Score 03/16/21 0855 0     Pain Loc --      Pain Edu? --      Excl. in [place]? --    No data found.  Updated Vital Signs BP 115/75 (BP Location: Right Arm)   Pulse 80   Temp 98.2 F (36.8 C)   Resp 16   SpO2 97%   Visual Acuity Right Eye Distance:   Left Eye Distance:    Bilateral Distance:    Right Eye Near:   Left Eye Near:    Bilateral Near:  Physical Exam Vitals and nursing note reviewed.  Constitutional:      General: She is not in acute distress.    Appearance: She is well-developed.  HENT:     Head: Normocephalic and atraumatic.  Eyes:     Conjunctiva/sclera: Conjunctivae normal.  Cardiovascular:     Rate and Rhythm: Normal rate and regular rhythm.     Heart sounds: No murmur heard. Pulmonary:     Effort: Pulmonary effort is normal. No respiratory distress.     Breath sounds: Normal breath sounds.  Abdominal:     Palpations: Abdomen is soft.     Tenderness: There is no abdominal tenderness.  Musculoskeletal:        General: No swelling.     Cervical back: Neck supple.  Skin:    General: Skin is warm and dry.     Capillary Refill: Capillary refill takes less than 2 seconds.  Neurological:     Mental Status: She is alert.  Psychiatric:        Mood and Affect: Mood normal.     UC Treatments / Results  Labs (all labs ordered are listed, but only abnormal results are displayed) Labs Reviewed  COVID-19, FLU A+B NAA    EKG   Radiology No results found.  Procedures Procedures (including critical care time)  Medications Ordered in UC Medications - No data to display  Initial Impression / Assessment and Plan / UC Course  I have reviewed the triage vital signs and the nursing notes.  Pertinent labs & imaging results that were available during my care of the patient were reviewed by me and considered in my medical decision making (see chart for details).     Pt well appearing, in no acute distress, vitals normal.  Stable for discharge.  COVID and Flu pending.  Return precautions discussed. Cough syrup precribed.  Final Clinical Impressions(s) / UC Diagnoses   Final diagnoses:  Exposure to COVID-19 virus   Discharge Instructions   None    ED Prescriptions   None    PDMP not reviewed this encounter.   Ward,  Tylene Fantasia, PA-C 03/16/21 [phone]

## 2021-03-16 NOTE — Discharge Instructions (Addendum)
Continue with cold and cough medications as needed Test results pending, will call Drink plenty of fluids Return if symptoms become worse.

## 2021-03-17 LAB — COVID-19, FLU A+B NAA
Influenza A, NAA: NOT DETECTED
Influenza B, NAA: NOT DETECTED
SARS-CoV-2, NAA: NOT DETECTED

## 2021-03-26 ENCOUNTER — Other Ambulatory Visit: Payer: Self-pay

## 2021-03-26 ENCOUNTER — Ambulatory Visit (HOSPITAL_COMMUNITY)
Admission: RE | Admit: 2021-03-26 | Discharge: 2021-03-26 | Disposition: A | Discharge status: Home / Self Care | Payer: PPO | Source: Ambulatory Visit | Attending: Cardiovascular Disease | Admitting: Cardiovascular Disease

## 2021-03-26 DIAGNOSIS — R0989 Other specified symptoms and signs involving the circulatory and respiratory systems: Secondary | ICD-10-CM | POA: Diagnosis not present

## 2021-04-08 ENCOUNTER — Other Ambulatory Visit: Payer: Self-pay | Admitting: Internal Medicine

## 2021-04-08 ENCOUNTER — Ambulatory Visit
Admission: RE | Admit: 2021-04-08 | Discharge: 2021-04-08 | Disposition: A | Discharge status: Home / Self Care | Payer: PPO | Source: Ambulatory Visit | Attending: Internal Medicine | Admitting: Internal Medicine

## 2021-04-08 ENCOUNTER — Other Ambulatory Visit: Payer: Self-pay

## 2021-04-08 DIAGNOSIS — M25551 Pain in right hip: Secondary | ICD-10-CM

## 2021-04-08 DIAGNOSIS — I2510 Atherosclerotic heart disease of native coronary artery without angina pectoris: Secondary | ICD-10-CM | POA: Diagnosis not present

## 2021-04-08 DIAGNOSIS — E7800 Pure hypercholesterolemia, unspecified: Secondary | ICD-10-CM | POA: Diagnosis not present

## 2021-04-08 DIAGNOSIS — N182 Chronic kidney disease, stage 2 (mild): Secondary | ICD-10-CM | POA: Diagnosis not present

## 2021-07-04 ENCOUNTER — Encounter: Payer: Self-pay | Admitting: Obstetrics & Gynecology

## 2021-07-04 ENCOUNTER — Other Ambulatory Visit: Payer: Self-pay

## 2021-07-04 ENCOUNTER — Telehealth: Payer: Self-pay

## 2021-07-04 ENCOUNTER — Ambulatory Visit (INDEPENDENT_AMBULATORY_CARE_PROVIDER_SITE_OTHER): Payer: PPO | Admitting: Obstetrics & Gynecology

## 2021-07-04 VITALS — BP 110/72 | HR 74 | Resp 16 | Ht 66.75 in | Wt 163.0 lb

## 2021-07-04 DIAGNOSIS — Z01419 Encounter for gynecological examination (general) (routine) without abnormal findings: Secondary | ICD-10-CM | POA: Diagnosis not present

## 2021-07-04 DIAGNOSIS — M8580 Other specified disorders of bone density and structure, unspecified site: Secondary | ICD-10-CM | POA: Diagnosis not present

## 2021-07-04 DIAGNOSIS — N952 Postmenopausal atrophic vaginitis: Secondary | ICD-10-CM

## 2021-07-04 MED ORDER — NONFORMULARY OR COMPOUNDED ITEM: 2 refills | Status: DC

## 2021-07-04 NOTE — Telephone Encounter (Signed)
Compound Estradiol cream to [facility] ?Received: Today ?Genia Del, MD  P [facility] Triage ?Recommend using Estradiol cream max 1 applicator weekly.  Will wean progressively and stop in the course of the year.  ?

## 2021-07-04 NOTE — Progress Notes (Signed)
? ? ?Kayla [name] 08-04-1940 [id number] ? ? ?History:    81 y.o. G2P2L2  ? ?RP:  Established patient presenting for annual gyn exam  ? ?HPI: Postmenopausal/atrophic genital changes.  Previous hysterectomy for AUB/adenomyosis.  No discharge or vaginal bleeding or significant vasomotor symptoms at this time.  Her atrophic vaginitis and recurrent dysuria symptoms have greatly improved using the vaginal estradiol cream. She is now using the cream once a week.  Abstinent. Pap smear 2011. No further Pap. Breasts normal.  Mammogram done at [facility] 03/2021, will obtain report. Colonoscopy 2013. BD 03/2021 with Osteopenia.  BMI 25.72.  Health labs with Fam MD. ? ?Past medical history,surgical history, family history and social history were all reviewed and documented in the EPIC chart. ? ?Gynecologic History ?No LMP recorded. Patient has had a hysterectomy. ? ?Obstetric History ?OB History  ?Gravida Para Term Preterm AB Living  ?2 2 2     2   ?SAB IAB Ectopic Multiple Live Births  ?           ?  ?# Outcome Date GA Lbr Len/2nd Weight Sex Delivery Anes PTL Lv  ?2 Term           ?1 Term           ? ? ? ?ROS: A ROS was performed and pertinent positives and negatives are included in the history. ? GENERAL: No fevers or chills. HEENT: No change in vision, no earache, sore throat or sinus congestion. NECK: No pain or stiffness. CARDIOVASCULAR: No chest pain or pressure. No palpitations. PULMONARY: No shortness of breath, cough or wheeze. GASTROINTESTINAL: No abdominal pain, nausea, vomiting or diarrhea, melena or bright red blood per rectum. GENITOURINARY: No urinary frequency, urgency, hesitancy or dysuria. MUSCULOSKELETAL: No joint or muscle pain, no back pain, no recent trauma. DERMATOLOGIC: No rash, no itching, no lesions. ENDOCRINE: No polyuria, polydipsia, no heat or cold intolerance. No recent change in weight. HEMATOLOGICAL: No anemia or easy bruising or bleeding. NEUROLOGIC: No headache, seizures, numbness, tingling or  weakness. PSYCHIATRIC: No depression, no loss of interest in normal activity or change in sleep pattern.  ?  ? ?Exam: ? ? ?BP 110/72   Pulse 74   Resp 16   Ht 5' 6.75" (1.695 m)   Wt 163 lb (73.9 kg)   BMI 25.72 kg/m?  ? ?Body mass index is 25.72 kg/m?. ? ?General appearance : Well developed well nourished female. No acute distress ?HEENT: Eyes: no retinal hemorrhage or exudates,  Neck supple, trachea midline, no carotid bruits, no thyroidmegaly ?Lungs: Clear to auscultation, no rhonchi or wheezes, or rib retractions  ?Heart: Regular rate and rhythm, no murmurs or gallops ?Breast:Examined in sitting and supine position were symmetrical in appearance, no palpable masses or tenderness,  no skin retraction, no nipple inversion, no nipple discharge, no skin discoloration, no axillary or supraclavicular lymphadenopathy ?Abdomen: no palpable masses or tenderness, no rebound or guarding ?Extremities: no edema or skin discoloration or tenderness ? ?Pelvic: Vulva: Normal ?            Vagina: No gross lesions or discharge ? Cervix/Uterus absent ? Adnexa  Without masses or tenderness ? Anus: Normal ? ? ?Assessment/Plan:  81 y.o. female for annual exam  ? ?1. Well female exam with routine gynecological exam ?Postmenopausal/atrophic genital changes.  Previous hysterectomy for AUB/adenomyosis.  No discharge or vaginal bleeding or significant vasomotor symptoms at this time.  Her atrophic vaginitis and recurrent dysuria symptoms have greatly improved using the vaginal estradiol  cream. She is now using the cream once a week.  Abstinent. Pap smear 2011. No further Pap. Breasts normal.  Mammogram done at [facility] 03/2021, will obtain report. Colonoscopy 2013. BD 03/2021 with Osteopenia.  BMI 25.72.  Health labs with Fam MD. ? ?2. Postmenopausal atrophic vaginitis ?Postmenopausal/atrophic genital changes.  Previous hysterectomy for AUB/adenomyosis.  No discharge or vaginal bleeding or significant vasomotor symptoms at this time.   Her atrophic vaginitis and recurrent dysuria symptoms have greatly improved using the vaginal estradiol cream. She is now using the cream once a week.  Abstinent.  Will refill, but recommend weaning and trying without cream in the course of the year. ? ?3. Osteopenia, unspecified location ?BD 03/2021 with Osteopenia.   ? ?Other orders ?- Calcium Citrate-Vitamin D (CALCIUM + D PO); Take by mouth. ?- Suvorexant (BELSOMRA) 20 MG TABS; Take by mouth.  ? ?Princess Bruins MD, 1:44 PM 07/04/2021 ? ?  ?

## 2021-07-04 NOTE — Telephone Encounter (Signed)
Per DPR access note on file left detailed message in pt voice mail that I had called in Rx to [facility] and they will call her when ready. ? ?I reminded her of new dose of maximum one appful weekly and trying to wean and stop in the course of this year. ?

## 2021-07-10 ENCOUNTER — Other Ambulatory Visit: Payer: Self-pay | Admitting: Cardiology

## 2021-07-19 ENCOUNTER — Encounter: Payer: Self-pay | Admitting: Obstetrics & Gynecology

## 2021-09-07 NOTE — Progress Notes (Unsigned)
Cardiology Office Note:    Date:  09/16/2021   ID:  Kayla [name], DOB 1940/11/14, MRN [id number]  PCP:  Kayla Panda, MD   [place]  Cardiologist:  Freada Bergeron, MD  Advanced Practice Provider:  No care team member to display Electrophysiologist:  None   Referring MD: Kayla Panda, MD    History of Present Illness:    Kayla [name] is a 81 y.o. female with a hx of CAD with MI in 2003 (no PCI), HLD, and HTN who was previously followed by Dr. Bronson [name] who now presents to clinic for follow-up.  Patient with remote history of MI in 2003. 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 No PCI performed. TTE 04/23/17 demonstrated normal left ventricular systolic function and regional wall motion, LVEF 60-65%, mild LVH, grade 1 diastolic dysfunction, mild to moderate mitral regurgitation, and moderate tricuspid regurgitation. Nuclear stress test on 04/27/17 was low risk.  There was a brief 7 beat run of atrial tachycardia during recovery.  There were no perfusion abnormalities.  Was last seen in clinic on 06/2020 where she was doing very well from a CV standpoint. Had remained active.  Today, the patient overall feels well. No chest pain, SOB, orthopnea, or PND. She has occasional LE edema by the end of the day with prolonged standing or sitting, but this resolves by the next morning. Blood pressure is very well controlled at home. Cholesterol by PCP at goal. LDL cholesterol 63, TG 70, HDL 72. Tolerating crestor.   Past Medical History:  Diagnosis Date   Hyperlipidemia    Hypertension    Ischemic heart disease    Myocardial infarction ([place]) 2003   Osteopenia 01/2019   T score -1.1 stable from prior DEXA    Past Surgical History:  Procedure Laterality Date   ABDOMINAL HYSTERECTOMY  1984   TAH bleeding  adenomyosis   BREAST SURGERY     Breast bx   CATARACT EXTRACTION W/ INTRAOCULAR LENS IMPLANT Bilateral 11/2017   CHOLECYSTECTOMY N/A 11/29/2018   Procedure: LAPAROSCOPIC CHOLECYSTECTOMY WITH INTRAOPERATIVE CHOLANGIOGRAM;  Surgeon: Johnathan Hausen, MD;  Location: [facility];  Service: General;  Laterality: N/A;    Current Medications: Current Meds  Medication Sig   Ascorbic Acid (VITA-C PO) Take 1,000 mg by mouth in the morning and at bedtime.   Calcium Citrate-Vitamin D (CALCIUM + D PO) Take by mouth.   Cholecalciferol (VITAMIN D) 1000 UNITS capsule Take 1,000 Units by mouth daily.   clopidogrel (PLAVIX) 75 MG tablet TAKE (1) TABLET BY MOUTH ONCE DAILY.   Coenzyme Q10 (COQ10) 100 MG CAPS Take 100 mg by mouth daily.   Methylcellulose, Laxative, (CITRUCEL PO) Take 1 packet by mouth at bedtime.   metoprolol tartrate (LOPRESSOR) 50 MG tablet TAKE (1/2) TABLET BY MOUTH TWICE DAILY.   NONFORMULARY OR COMPOUNDED ITEM Estradiol vaginal cream [id number] insert 1 applicator once weekly.   Omega-3 Fatty Acids (FISH OIL) 1200 MG CAPS Take 1,200 mg by mouth daily.   rosuvastatin (CRESTOR) 10 MG tablet TAKE 1 TABLET BY MOUTH DAILY.   [DISCONTINUED] aspirin 81 MG tablet Take 81 mg by mouth daily.   [DISCONTINUED] nitroGLYCERIN (NITROSTAT) 0.4 MG SL tablet Place 1 tablet (0.4 mg total) under the tongue every 5 (five) minutes as needed for chest pain.     Allergies:   Codeine, Lipitor ONEOK  calcium], and Simvastatin   Social History   Socioeconomic History   Marital status: Widowed    Spouse name: Not on file   Number of children: Not on file   Years of education: Not on file   Highest education level: Not on file  Occupational History   Not on file  Tobacco Use   Smoking status: Never   Smokeless tobacco: Never  Vaping Use   Vaping Use: Never used  Substance and Sexual Activity   Alcohol use: No    Alcohol/week: 0.0 standard drinks   Drug use: No   Sexual activity: Not Currently     Partners: Male    Birth control/protection: Surgical    Comment: 1st intercourse 81 yo-Fewer than 5 partners  Other Topics Concern   Not on file  Social History Narrative   Not on file   Social Determinants of Health   Financial Resource Strain: Not on file  Food Insecurity: Not on file  Transportation Needs: Not on file  Physical Activity: Not on file  Stress: Not on file  Social Connections: Not on file     Family History: The patient's family history includes Breast cancer in her maternal grandmother; Cancer in her paternal grandmother; Colon cancer in her father; Heart disease in her father and mother; Hypertension in her mother; Osteoporosis in her mother.  ROS:   Please see the history of present illness.    Review of Systems  Constitutional:  Negative for chills and fever.  HENT:  Negative for hearing loss.   Eyes:  Negative for blurred vision and redness.  Respiratory:  Negative for shortness of breath.   Cardiovascular:  Positive for leg swelling. Negative for chest pain, palpitations, orthopnea, claudication and PND.  Gastrointestinal:  Negative for melena, nausea and vomiting.  Genitourinary:  Negative for dysuria and flank pain.  Musculoskeletal:  Negative for falls.  Neurological:  Negative for dizziness and loss of consciousness.  Endo/Heme/Allergies:  Negative for polydipsia.  Psychiatric/Behavioral:  Negative for substance abuse.    EKGs/Labs/Other Studies Reviewed:    The following studies were reviewed today: TTE 2018: Study Conclusions   - Left ventricle: The cavity size was normal. Wall thickness was    increased in a pattern of mild LVH. Systolic function was normal.    The estimated ejection fraction was in the range of 60% to 65%.    Wall motion was normal; there were no regional wall motion    abnormalities. Doppler parameters are consistent with abnormal    left ventricular relaxation (grade 1 diastolic dysfunction).  - Aortic valve:  Trileaflet; mildly thickened, mildly calcified    leaflets.  - Mitral valve: Mildly calcified annulus. Mildly thickened leaflets    . There was mild to moderate regurgitation.  - Right atrium: The atrium was mildly dilated.  - Tricuspid valve: There was moderate regurgitation.  Myoview 2018: Blood pressure demonstrated a normal response to exercise. There was no ST segment deviation noted during stress. 7 beat run of atrial tachycardia during recovery asymptomatic. The study is normal. There are no perfusion defects This is a low risk study. The left ventricular ejection fraction is hyperdynamic (>65%). Elevated TID of 1.47 in setting of otherwise completely normal study, nonspecific finding.   EKG:  EKG is  ordered today.  The ekg ordered today demonstrates NSR with HR 66  Recent Labs: No results found for requested labs within last 8760 hours.  Recent Lipid Panel    Component Value Date/Time  CHOL 169 03/12/2015 0840   TRIG 49 03/12/2015 0840   HDL 77 03/12/2015 0840   CHOLHDL 2.2 03/12/2015 0840   VLDL 10 03/12/2015 0840   LDLCALC 82 03/12/2015 0840     Physical Exam:    VS:  BP 114/70   Pulse (!) 58   Ht 5' 6.75" (1.695 m)   Wt 161 lb 9.6 oz (73.3 kg)   SpO2 97%   BMI 25.50 kg/m     Wt Readings from Last 3 Encounters:  09/16/21 161 lb 9.6 oz (73.3 kg)  07/04/21 163 lb (73.9 kg)  03/13/21 158 lb (71.7 kg)     GEN:  Well nourished, well developed in no acute distress HEENT: Normal NECK: No JVD; No carotid bruits CARDIAC: RRR, 1/6 systolic murmur. No  rubs, gallops RESPIRATORY:  CTAB, no wheezes ABDOMEN: Soft, non-tender, non-distended MUSCULOSKELETAL:  No edema; No deformity  SKIN: Warm and dry NEUROLOGIC:  Alert and oriented x 3 PSYCHIATRIC:  Normal affect   ASSESSMENT:    1. Coronary artery disease involving native coronary artery of native heart without angina pectoris   2. History of myocardial infarction   3. Essential hypertension   4.  Hyperlipidemia LDL goal <70    PLAN:    In order of problems listed above:  #Known CAD s/p remote MI in 2003: Cath 01/13/2002 withlarge first diagonal branch with a proximal 95% stenosis. 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 No PCI performed. TTE 04/23/17 demonstrated normal left ventricular systolic function and regional wall motion, LVEF 60-65%, mild LVH, grade 1 diastolic dysfunction, mild to moderate mitral regurgitation, and moderate tricuspid regurgitation. Nuclear stress test on 04/27/17 was low risk. No anginal symptoms and patient is active without exercise limitations. -Stop ASA 81mg  daily -Continue plavix 75mg  daily (patient prefers to stay on plavix over ASA) -Continue metop 25mg  BID -Continue crestor 10mg  daily  #Mild-to-moderate MR>trivial in 2022 #Moderate TR>mild in 2022 Improved to trivial MR and mild TR on TTE in 2022. No further monitoring needed unless clinical change  #HTN: -Continue metoprolol 25mg  BID  #HLD: -Continue crestor 10mg  daily -LDL 63 with PCP 2023    Medication Adjustments/Labs and Tests Ordered: Current medicines are reviewed at length with the patient today.  Concerns regarding medicines are outlined above.  No orders of the defined types were placed in this encounter.  Meds ordered this encounter  Medications   nitroGLYCERIN (NITROSTAT) 0.4 MG SL tablet    Sig: Place 1 tablet (0.4 mg total) under the tongue every 5 (five) minutes as needed for chest pain.    Dispense:  25 tablet    Refill:  3    Patient Instructions  Medication Instructions:   STOP TAKING ASPIRIN NOW  *If you need a refill on your cardiac medications before your next appointment, please call your pharmacy*   Follow-Up: At [facility], you and your health needs are our priority.  As part of our continuing mission to provide you with exceptional heart care, we have created designated Provider Care Teams.  These  Care Teams include your primary Cardiologist (physician) and Advanced Practice Providers (APPs -  Physician Assistants and Nurse Practitioners) who all work together to provide you with the care you need, when you need it.  We recommend signing up for the patient portal called "MyChart".  Sign up information is provided on this After Visit Summary.  MyChart is used to connect with patients for Virtual Visits (  Telemedicine).  Patients are able to view lab/test results, encounter notes, upcoming appointments, etc.  Non-urgent messages can be sent to your provider as well.   To learn more about what you can do with MyChart, go to [email or web address].    Your next appointment:   1 year(s)  The format for your next appointment:   In Person  Provider:   Freada Bergeron, MD    Important Information About Sugar         Signed, Freada Bergeron, MD  09/16/2021 10:30 AM    [place]

## 2021-09-16 ENCOUNTER — Encounter: Payer: Self-pay | Admitting: Cardiology

## 2021-09-16 ENCOUNTER — Ambulatory Visit: Payer: PPO | Admitting: Cardiology

## 2021-09-16 VITALS — BP 114/70 | HR 58 | Ht 66.75 in | Wt 161.6 lb

## 2021-09-16 DIAGNOSIS — E785 Hyperlipidemia, unspecified: Secondary | ICD-10-CM | POA: Diagnosis not present

## 2021-09-16 DIAGNOSIS — I2510 Atherosclerotic heart disease of native coronary artery without angina pectoris: Secondary | ICD-10-CM | POA: Diagnosis not present

## 2021-09-16 DIAGNOSIS — I10 Essential (primary) hypertension: Secondary | ICD-10-CM

## 2021-09-16 DIAGNOSIS — I252 Old myocardial infarction: Secondary | ICD-10-CM | POA: Diagnosis not present

## 2021-09-16 MED ORDER — NITROGLYCERIN 0.4 MG SL SUBL: 0.4000 mg | SUBLINGUAL_TABLET | SUBLINGUAL | 3 refills | Status: AC | PRN

## 2021-09-16 NOTE — Patient Instructions (Signed)
Medication Instructions:   STOP TAKING ASPIRIN NOW  *If you need a refill on your cardiac medications before your next appointment, please call your pharmacy*   Follow-Up: At [facility], you and your health needs are our priority.  As part of our continuing mission to provide you with exceptional heart care, we have created designated Provider Care Teams.  These Care Teams include your primary Cardiologist (physician) and Advanced Practice Providers (APPs -  Physician Assistants and Nurse Practitioners) who all work together to provide you with the care you need, when you need it.  We recommend signing up for the patient portal called "MyChart".  Sign up information is provided on this After Visit Summary.  MyChart is used to connect with patients for Virtual Visits (Telemedicine).  Patients are able to view lab/test results, encounter notes, upcoming appointments, etc.  Non-urgent messages can be sent to your provider as well.   To learn more about what you can do with MyChart, go to [email or web address].    Your next appointment:   1 year(s)  The format for your next appointment:   In Person  Provider:   Meriam Sprague, MD    Important Information About Sugar

## 2021-10-09 ENCOUNTER — Telehealth: Payer: Self-pay | Admitting: Cardiology

## 2021-10-09 MED ORDER — CLOPIDOGREL BISULFATE 75 MG PO TABS: 75.0000 mg | ORAL_TABLET | Freq: Every day | ORAL | 3 refills | Status: DC

## 2021-10-09 NOTE — Telephone Encounter (Signed)
*  STAT* If patient is at the pharmacy, call can be transferred to refill team.   1. Which medications need to be refilled? (please list name of each medication and dose if known)  clopidogrel (PLAVIX) 75 MG tablet  2. Which pharmacy/location (including street and city if local pharmacy) is medication to be sent to? [facility] - [place], [place] - [address]  3. Do they need a 30 day or 90 day supply? 90 day  Patient is out of medication

## 2021-10-09 NOTE — Telephone Encounter (Signed)
Pt's medication was sent to pt's pharmacy as requested. Confirmation received.  °

## 2021-11-08 ENCOUNTER — Other Ambulatory Visit: Payer: Self-pay

## 2021-11-08 MED ORDER — ROSUVASTATIN CALCIUM 10 MG PO TABS: 10.0000 mg | ORAL_TABLET | Freq: Every day | ORAL | 1 refills | Status: DC

## 2021-11-20 ENCOUNTER — Other Ambulatory Visit: Payer: Self-pay

## 2021-11-20 MED ORDER — METOPROLOL TARTRATE 50 MG PO TABS: ORAL_TABLET | ORAL | 2 refills | Status: DC

## 2022-01-07 ENCOUNTER — Ambulatory Visit (HOSPITAL_COMMUNITY)
Admission: RE | Admit: 2022-01-07 | Discharge: 2022-01-07 | Disposition: A | Discharge status: Home / Self Care | Payer: PPO | Source: Ambulatory Visit | Attending: Otolaryngology | Admitting: Otolaryngology

## 2022-01-07 ENCOUNTER — Other Ambulatory Visit (HOSPITAL_COMMUNITY): Payer: Self-pay | Admitting: Otolaryngology

## 2022-01-07 ENCOUNTER — Other Ambulatory Visit: Payer: Self-pay | Admitting: Otolaryngology

## 2022-01-07 DIAGNOSIS — M542 Cervicalgia: Secondary | ICD-10-CM | POA: Diagnosis present

## 2022-01-08 ENCOUNTER — Ambulatory Visit: Payer: PPO | Admitting: Orthopedic Surgery

## 2022-01-27 ENCOUNTER — Ambulatory Visit: Payer: PPO | Admitting: Orthopedic Surgery

## 2022-01-27 DIAGNOSIS — M25562 Pain in left knee: Secondary | ICD-10-CM

## 2022-01-27 DIAGNOSIS — M25561 Pain in right knee: Secondary | ICD-10-CM | POA: Diagnosis not present

## 2022-01-27 DIAGNOSIS — M25551 Pain in right hip: Secondary | ICD-10-CM

## 2022-01-27 DIAGNOSIS — G8929 Other chronic pain: Secondary | ICD-10-CM

## 2022-01-27 DIAGNOSIS — M542 Cervicalgia: Secondary | ICD-10-CM

## 2022-01-29 ENCOUNTER — Encounter: Payer: Self-pay | Admitting: Orthopedic Surgery

## 2022-01-29 NOTE — Progress Notes (Signed)
Office Visit Note   Patient: Kayla [name]           Date of Birth: 1940-06-20           MRN: [id number] Visit Date: 01/27/2022 Requested by: Jilda Panda, MD [id number] [place],  [place] [address] PCP: Jilda Panda, MD  Subjective: Chief Complaint  Patient presents with   Left Shoulder - Pain   Other    Hip/knee pain    HPI: Kayla [name] is a 81 y.o. female who presents to the office reporting multiple orthopedic complaints.  She describes right hip and bilateral knee pain.  The appointment was initially made for that problem but that actually feels better.  She also describes left shoulder and neck pain.  She has been to the urgent care on 3 occasions for this problem.  Denies any radicular arm pain and numbness and tingling but does report some painful range of motion with that left shoulder.  The medications of Tylenol prednisone Dosepak muscle relaxer and Ultram have helped the symptoms.  Initially during her annual physical examination she described bilateral knee pain as well as right hip pain as well as problems getting up after sitting.  Since the medication has been prescribed she describes more left shoulder and neck pain.  Last 4 to 5 weeks the symptoms have been bad.  The medications for the shoulder and neck of actually helped make her knees and hips asymptomatic.  Now most of her pain is in the shoulder.  She has had neck radiographs and a cervical spine MRI.  The MRI scan is reviewed today and it shows cervical spondylosis without high-grade spinal canal or neuroforaminal stenosis.  She currently is on prednisone 10 mg a day.  Does have a history of heart attack.              ROS: All systems reviewed are negative as they relate to the chief complaint within the history of present illness.  Patient denies fevers or chills.  Assessment & Plan: Visit Diagnoses:  1. Pain in right hip   2. Chronic pain of both knees   3. Neck pain on left side     Plan: Impression is  hip and knee pain which has improved on steroid dose treatment neck and shoulder pain could be from arthritis.  She does have a little bit of coarseness in that left shoulder.  Her main concern now is coming off the prednisone.  We discussed a tapering dose over the next 4 weeks.  What ever symptoms she has left at that time we could work-up further and could also consider intra-articular injection into the shoulder hand or knees and hip.  She wants to hold off on the injection treatment for now but does want to taper the steroid dose.  She will follow-up with [place] as needed.  I think injections in the cervical spine could also be done but she would have to come off Plavix for that intervention. Follow-Up Instructions: No follow-ups on file.   Orders:  No orders of the defined types were placed in this encounter.  No orders of the defined types were placed in this encounter.     Procedures: No procedures performed   Clinical Data: No additional findings.  Objective: Vital Signs: There were no vitals taken for this visit.  Physical Exam:  Constitutional: Patient appears well-developed HEENT:  Head: Normocephalic Eyes:EOM are normal Neck: Normal range of motion Cardiovascular: Normal rate Pulmonary/chest: Effort normal  Neurologic: Patient is alert Skin: Skin is warm Psychiatric: Patient has normal mood and affect  Ortho Exam: Ortho exam demonstrates normal gait alignment.  She has mild patellofemoral crepitus bilaterally with no bilateral knee effusions.  Pedal pulses palpable.  Ankle dorsiflexion plantarflexion quad and hamstring strength intact with good hip flexion strength noted.  No masses lymphadenopathy or skin changes noted in the bilateral lower extremity region.  No groin pain on the right or left-hand side with internal/external rotation of the leg.  She has pretty reasonable cervical spine range of motion.  She has a little bit of coarseness on that left shoulder with passive  range of motion of 70/100/175.  Rotator cuff strength intact at infraspinatus supraspinatus and subscap muscle testing on the left.  Deltoid is functional.  She has 5 out of 5 grip EPL FPL interosseous wrist flexion extension bicep triceps and deltoid strength with no paresthesias C5-T1 or L1-S1 bilaterally.  Specialty Comments:  No specialty comments available.  Imaging: No results found.   PMFS History: Patient Active Problem List   Diagnosis Date Noted   S/P laparoscopic cholecystectomyAugust2020 11/29/2018   Benign hypertensive heart disease without heart failure 03/01/2013   Vaginal atrophy 05/03/2012   Myocardial infarction [facility])    Hyperlipidemia    Ischemic heart disease    Senile osteoporosis 02/27/2011   Breast cyst 02/27/2011   Hyperkalemia 01/28/2011   Old MI (myocardial infarction) 08/08/2010   Hypercholesterolemia 08/08/2010   Osteoporosis 08/08/2010   Past Medical History:  Diagnosis Date   Hyperlipidemia    Hypertension    Ischemic heart disease    Myocardial infarction ([place]) 2003   Osteopenia 01/2019   T score -1.1 stable from prior DEXA    Family History  Problem Relation Age of Onset   Hypertension Mother    Heart disease Mother    Osteoporosis Mother    Heart disease Father    Colon cancer Father    Breast cancer Maternal Grandmother        Age unknown   Cancer Paternal Grandmother        Unknown type    Past Surgical History:  Procedure Laterality Date   ABDOMINAL HYSTERECTOMY  1984   TAH bleeding adenomyosis   BREAST SURGERY     Breast bx   CATARACT EXTRACTION W/ INTRAOCULAR LENS IMPLANT Bilateral 11/2017   CHOLECYSTECTOMY N/A 11/29/2018   Procedure: LAPAROSCOPIC CHOLECYSTECTOMY WITH INTRAOPERATIVE CHOLANGIOGRAM;  Surgeon: Johnathan Hausen, MD;  Location: [facility];  Service: General;  Laterality: N/A;   Social History   Occupational History   Not on file  Tobacco Use   Smoking status: Never   Smokeless tobacco: Never  Vaping Use    Vaping Use: Never used  Substance and Sexual Activity   Alcohol use: No    Alcohol/week: 0.0 standard drinks of alcohol   Drug use: No   Sexual activity: Not Currently    Partners: Male    Birth control/protection: Surgical    Comment: 1st intercourse 81 yo-Fewer than 5 partners

## 2022-04-10 ENCOUNTER — Ambulatory Visit
Admission: EM | Admit: 2022-04-10 | Discharge: 2022-04-10 | Disposition: A | Discharge status: Home / Self Care | Payer: PPO | Attending: Nurse Practitioner | Admitting: Nurse Practitioner

## 2022-04-10 ENCOUNTER — Encounter: Payer: Self-pay | Admitting: Emergency Medicine

## 2022-04-10 DIAGNOSIS — R197 Diarrhea, unspecified: Secondary | ICD-10-CM | POA: Insufficient documentation

## 2022-04-10 DIAGNOSIS — B349 Viral infection, unspecified: Secondary | ICD-10-CM | POA: Insufficient documentation

## 2022-04-10 DIAGNOSIS — Z79899 Other long term (current) drug therapy: Secondary | ICD-10-CM | POA: Insufficient documentation

## 2022-04-10 DIAGNOSIS — U071 COVID-19: Secondary | ICD-10-CM | POA: Insufficient documentation

## 2022-04-10 DIAGNOSIS — R8290 Unspecified abnormal findings in urine: Secondary | ICD-10-CM | POA: Diagnosis present

## 2022-04-10 LAB — POCT URINALYSIS DIP (MANUAL ENTRY)
Blood, UA: NEGATIVE
Glucose, UA: NEGATIVE mg/dL
Leukocytes, UA: NEGATIVE
Nitrite, UA: NEGATIVE
Protein Ur, POC: NEGATIVE mg/dL
Spec Grav, UA: >=1.03 — AB (ref 1.010–1.025)
Urobilinogen, UA: 0.2 E.U./dL
pH, UA: 5.5 (ref 5.0–8.0)

## 2022-04-10 LAB — RESP PANEL BY RT-PCR (RSV, FLU A&B, COVID)  RVPGX2
Influenza A by PCR: NEGATIVE
Influenza B by PCR: NEGATIVE
Resp Syncytial Virus by PCR: NEGATIVE
SARS Coronavirus 2 by RT PCR: POSITIVE — AB

## 2022-04-10 MED ORDER — BENZONATATE 100 MG PO CAPS: 100.0000 mg | ORAL_CAPSULE | Freq: Three times a day (TID) | ORAL | 0 refills | Status: DC | PRN

## 2022-04-10 MED ORDER — LOPERAMIDE HCL 2 MG PO CAPS: 2.0000 mg | ORAL_CAPSULE | Freq: Four times a day (QID) | ORAL | 0 refills | Status: DC | PRN

## 2022-04-10 MED ORDER — FLUTICASONE PROPIONATE 50 MCG/ACT NA SUSP: 2.0000 | Freq: Every day | NASAL | 0 refills | Status: DC

## 2022-04-10 NOTE — ED Provider Notes (Addendum)
[facility]    CSN: [id number] Arrival date & time: 04/10/22  0805      History   Chief Complaint No chief complaint on file.   HPI Kayla [name] is a 81 y.o. female.   The history is provided by the patient.   Patient presents of scratchy throat, nasal congestion, and diarrhea.  Patient states that symptoms started with a scratchy throat approximately 2 days ago.  She states that she has also had increased nasal congestion and had episodes of diarrhea 1 day ago.  She reports 4 episodes of diarrhea on yesterday, and 1 episode this morning.  She denies fever, chills, headache, cough, abdominal pain, nausea, vomiting or urinary symptoms.  She further denies bloody or melena stools.Marland Kitchen  He reports that the diarrhea is not associated with eating.  She reports that she currently is taking prednisone for musculoskeletal concerns.  She reports that she has also taken NyQuil for her symptoms.  She denies any known sick contacts.  Past Medical History:  Diagnosis Date   Hyperlipidemia    Hypertension    Ischemic heart disease    Myocardial infarction (HCC) 2003   Osteopenia 01/2019   T score -1.1 stable from prior DEXA    Patient Active Problem List   Diagnosis Date Noted   S/P laparoscopic cholecystectomyAugust2020 11/29/2018   Benign hypertensive heart disease without heart failure 03/01/2013   Vaginal atrophy 05/03/2012   Myocardial infarction [facility])    Hyperlipidemia    Ischemic heart disease    Senile osteoporosis 02/27/2011   Breast cyst 02/27/2011   Hyperkalemia 01/28/2011   Old MI (myocardial infarction) 08/08/2010   Hypercholesterolemia 08/08/2010   Osteoporosis 08/08/2010    Past Surgical History:  Procedure Laterality Date   ABDOMINAL HYSTERECTOMY  1984   TAH bleeding adenomyosis   BREAST SURGERY     Breast bx   CATARACT EXTRACTION W/ INTRAOCULAR LENS IMPLANT Bilateral 11/2017   CHOLECYSTECTOMY N/A 11/29/2018   Procedure: LAPAROSCOPIC CHOLECYSTECTOMY  WITH INTRAOPERATIVE CHOLANGIOGRAM;  Surgeon: Luretha Murphy, MD;  Location: [facility];  Service: General;  Laterality: N/A;    OB History     Gravida  2   Para  2   Term  2   Preterm      AB      Living  2      SAB      IAB      Ectopic      Multiple      Live Births               Home Medications    Prior to Admission medications   Medication Sig Start Date End Date Taking? Authorizing Provider  benzonatate (TESSALON PERLES) 100 MG capsule Take 1 capsule (100 mg total) by mouth 3 (three) times daily as needed for cough. 04/10/22  Yes Renny Gunnarson-Warren, Sadie Haber, NP  fluticasone (FLONASE) 50 MCG/ACT nasal spray Place 2 sprays into both nostrils daily. 04/10/22  Yes Benjiman Sedgwick-Warren, Sadie Haber, NP  loperamide (IMODIUM) 2 MG capsule Take 1 capsule (2 mg total) by mouth 4 (four) times daily as needed for diarrhea or loose stools. 04/10/22  Yes Neale Marzette-Warren, Sadie Haber, NP  Ascorbic Acid (VITA-C PO) Take 1,000 mg by mouth in the morning and at bedtime.    [provider]  Calcium Citrate-Vitamin D (CALCIUM + D PO) Take by mouth.    [provider]  Cholecalciferol (VITAMIN D) 1000 UNITS capsule Take 1,000 Units by mouth daily.  [provider]  clopidogrel (PLAVIX) 75 MG tablet Take 1 tablet (75 mg total) by mouth daily. 10/09/21   Meriam Sprague, MD  Coenzyme Q10 (COQ10) 100 MG CAPS Take 100 mg by mouth daily.    [provider]  Methylcellulose, Laxative, (CITRUCEL PO) Take 1 packet by mouth at bedtime.    [provider]  metoprolol tartrate (LOPRESSOR) 50 MG tablet TAKE (1/2) TABLET BY MOUTH TWICE DAILY. 11/20/21   Meriam Sprague, MD  nitroGLYCERIN (NITROSTAT) 0.4 MG SL tablet Place 1 tablet (0.4 mg total) under the tongue every 5 (five) minutes as needed for chest pain. 09/16/21   Meriam Sprague, MD  NONFORMULARY OR COMPOUNDED ITEM Estradiol vaginal cream 0.02% insert 1 applicator once weekly. 07/04/21   Genia Del, MD  Omega-3 Fatty Acids (FISH OIL) 1200 MG CAPS Take 1,200 mg by mouth daily.    [provider]  rosuvastatin (CRESTOR) 10 MG tablet Take 1 tablet (10 mg total) by mouth daily. 11/08/21   Meriam Sprague, MD    Family History Family History  Problem Relation Age of Onset   Hypertension Mother    Heart disease Mother    Osteoporosis Mother    Heart disease Father    Colon cancer Father    Breast cancer Maternal Grandmother        Age unknown   Cancer Paternal Grandmother        Unknown type    Social History Social History   Tobacco Use   Smoking status: Never   Smokeless tobacco: Never  Vaping Use   Vaping Use: Never used  Substance Use Topics   Alcohol use: No    Alcohol/week: 0.0 standard drinks of alcohol   Drug use: No     Allergies   Codeine, Lipitor [atorvastatin calcium], and Simvastatin   Review of Systems Review of Systems Per HPI  Physical Exam Triage Vital Signs ED Triage Vitals  Enc Vitals Group     BP 04/10/22 0816 138/75     Pulse Rate 04/10/22 0816 100     Resp 04/10/22 0816 18     Temp 04/10/22 0816 98.7 F (37.1 C)     Temp Source 04/10/22 0816 Oral     SpO2 04/10/22 0816 97 %     Weight --      Height --      Head Circumference --      Peak Flow --      Pain Score 04/10/22 0817 0     Pain Loc --      Pain Edu? --      Excl. in [place]? --    No data found.  Updated Vital Signs BP 138/75 (BP Location: Right Arm)   Pulse 100   Temp 98.7 F (37.1 C) (Oral)   Resp 18   SpO2 97%   Visual Acuity Right Eye Distance:   Left Eye Distance:   Bilateral Distance:    Right Eye Near:   Left Eye Near:    Bilateral Near:     Physical Exam Vitals and nursing note reviewed.  Constitutional:      General: She is not in acute distress.    Appearance: Normal appearance.  HENT:     Head: Normocephalic.     Right Ear: Tympanic membrane, ear canal and external ear normal.     Left Ear: Tympanic membrane, ear canal  and external ear normal.     Nose: Congestion present.  Mouth/Throat:     Mouth: Mucous membranes are moist.     Pharynx: Posterior oropharyngeal erythema present. No oropharyngeal exudate.  Eyes:     Extraocular Movements: Extraocular movements intact.     Conjunctiva/sclera: Conjunctivae normal.     Pupils: Pupils are equal, round, and reactive to light.  Cardiovascular:     Rate and Rhythm: Normal rate and regular rhythm.     Pulses: Normal pulses.     Heart sounds: Normal heart sounds.  Pulmonary:     Effort: Pulmonary effort is normal. No respiratory distress.     Breath sounds: No stridor. No wheezing, rhonchi or rales.  Abdominal:     General: Bowel sounds are normal.     Palpations: Abdomen is soft.     Tenderness: There is no abdominal tenderness.  Musculoskeletal:     Cervical back: Normal range of motion.  Lymphadenopathy:     Cervical: No cervical adenopathy.  Skin:    General: Skin is warm and dry.  Neurological:     General: No focal deficit present.     Mental Status: She is alert and oriented to person, place, and time.  Psychiatric:        Mood and Affect: Mood normal.        Behavior: Behavior normal.      UC Treatments / Results  Labs (all labs ordered are listed, but only abnormal results are displayed) Labs Reviewed  POCT URINALYSIS DIP (MANUAL ENTRY) - Abnormal; Notable for the following components:      Result Value   Clarity, UA hazy (*)    Bilirubin, UA small (*)    Ketones, POC UA trace (5) (*)    Spec Grav, UA >=1.030 (*)    All other components within normal limits  RESP PANEL BY RT-PCR (RSV, FLU A&B, COVID)  [id number]  URINE CULTURE    EKG   Radiology No results found.  Procedures Procedures (including critical care time)  Medications Ordered in UC Medications - No data to display  Initial Impression / Assessment and Plan / UC Course  I have reviewed the triage vital signs and the nursing notes.  Pertinent labs & imaging  results that were available during my care of the patient were reviewed by me and considered in my medical decision making (see chart for details).  The patient is well-appearing, she is in no acute distress, vital signs are stable.  Suspect viral illness at this time.  COVID/flu/RSV test results are pending.  Urinalysis with an elevated specific gravity and ketones.  Urine culture has been ordered for abnormal urinalysis results, to rule out acute cystitis.  Will treat patient's upper respiratory symptoms with fluticasone 50 mcg nasal spray to help with her nasal congestion, and benzonatate 100 mg to help with the cough.  For her diarrhea symptoms, patient was prescribed loperamide 2 mg.  Patient was also given information regarding foods to increase the bulk in her stool.  If the patient's COVID result is positive, she is a candidate to receive molnupiravir.  Supportive care recommendations were provided to the patient to include increasing her water intake, and eating foods that are high in fiber.  Patient was given strict ER precautions.  Patient verbalizes understanding.  All questions were answered.  Patient stable for discharge. Final Clinical Impressions(s) / UC Diagnoses   Final diagnoses:  Viral illness  Diarrhea, unspecified type  Abnormal urinalysis     Discharge Instructions      The urinalysis shows  you need to increase your water intake.  Try to drink at least 5-6 bottles of water daily to prevent dehydration. The COVID/flu/RSV test results are pending.  You will be contacted if the test results are positive.  If your COVID test is positive, you are a candidate to receive molnupiravir as an antiviral therapy. Take medication as prescribed. Recommend a diet that is high in fiber that will increase the bulk in your stool. If you develop new symptoms to include fever, chills, nausea, vomiting, and worsening diarrhea, please follow-up in the emergency department for further  evaluation. If symptoms fail to improve over the next 24 to 48 hours, please follow-up with your primary care physician for further evaluation. Follow-up as needed.     ED Prescriptions     Medication Sig Dispense Auth. Provider   loperamide (IMODIUM) 2 MG capsule Take 1 capsule (2 mg total) by mouth 4 (four) times daily as needed for diarrhea or loose stools. 12 capsule Matsue Strom-Warren, Sadie Haberhristie J, NP   benzonatate (TESSALON PERLES) 100 MG capsule Take 1 capsule (100 mg total) by mouth 3 (three) times daily as needed for cough. 30 capsule Estil Vallee-Warren, Sadie Haberhristie J, NP   fluticasone (FLONASE) 50 MCG/ACT nasal spray Place 2 sprays into both nostrils daily. 16 g Catia Todorov-Warren, Sadie Haberhristie J, NP      PDMP not reviewed this encounter.   Abran CantorLeath-Warren, Jeannette Maddy J, NP 04/10/22 [id number]    Abran CantorLeath-Warren, Loral Campi J, NP 04/10/22 1026

## 2022-04-10 NOTE — Discharge Instructions (Addendum)
The urinalysis shows you need to increase your water intake.  Try to drink at least 5-6 bottles of water daily to prevent dehydration. The COVID/flu/RSV test results are pending.  You will be contacted if the test results are positive.  If your COVID test is positive, you are a candidate to receive molnupiravir as an antiviral therapy. Take medication as prescribed. Recommend a diet that is high in fiber that will increase the bulk in your stool. If you develop new symptoms to include fever, chills, nausea, vomiting, and worsening diarrhea, please follow-up in the emergency department for further evaluation. If symptoms fail to improve over the next 24 to 48 hours, please follow-up with your primary care physician for further evaluation. Follow-up as needed.

## 2022-04-10 NOTE — ED Triage Notes (Signed)
Scratchy throat on Tuesday.  Nasal congestion and diarrhea started on Wednesday.

## 2022-04-11 ENCOUNTER — Telehealth (HOSPITAL_COMMUNITY): Payer: Self-pay | Admitting: Emergency Medicine

## 2022-04-11 MED ORDER — MOLNUPIRAVIR EUA 200MG CAPSULE: 4.0000 | ORAL_CAPSULE | Freq: Two times a day (BID) | ORAL | 0 refills | Status: AC

## 2022-04-11 NOTE — Telephone Encounter (Signed)
Opened a duplicate in error

## 2022-05-07 ENCOUNTER — Other Ambulatory Visit: Payer: Self-pay

## 2022-05-07 MED ORDER — ROSUVASTATIN CALCIUM 10 MG PO TABS: 10.0000 mg | ORAL_TABLET | Freq: Every day | ORAL | 1 refills | Status: DC

## 2022-05-07 NOTE — Telephone Encounter (Signed)
Pt's medication was sent to pt's pharmacy as requested. Confirmation received.  °

## 2022-06-08 ENCOUNTER — Ambulatory Visit (INDEPENDENT_AMBULATORY_CARE_PROVIDER_SITE_OTHER): Payer: PPO

## 2022-06-08 ENCOUNTER — Ambulatory Visit
Admission: EM | Admit: 2022-06-08 | Discharge: 2022-06-08 | Disposition: A | Discharge status: Home / Self Care | Payer: PPO | Attending: Family Medicine | Admitting: Family Medicine

## 2022-06-08 DIAGNOSIS — M79671 Pain in right foot: Secondary | ICD-10-CM | POA: Diagnosis not present

## 2022-06-08 DIAGNOSIS — W19XXXA Unspecified fall, initial encounter: Secondary | ICD-10-CM

## 2022-06-08 DIAGNOSIS — M25571 Pain in right ankle and joints of right foot: Secondary | ICD-10-CM | POA: Diagnosis not present

## 2022-06-08 NOTE — ED Triage Notes (Signed)
Pt reports she [place]. Her right foot is very swollen. Reports no pain anywhere else in her body. Limited ROM, and slightly hurts to walk

## 2022-06-08 NOTE — ED Provider Notes (Signed)
[facility]    CSN: [id number] Arrival date & time: 06/08/22  1238      History   Chief Complaint Chief Complaint  Patient presents with   Fall    HPI Kayla [name] is a 82 y.o. female.   Patient presenting today with right lateral foot and ankle pain, swelling after tripping down a lower step today and twisting the ankle inward.  Denies decreased range of motion, numbness, tingling, weakness, any other areas of injury from the incident.  Did not hit her head or lose consciousness during the fall and states she mostly caught herself with her hand on the railing.  So far has not tried anything for symptoms.    Past Medical History:  Diagnosis Date   Hyperlipidemia    Hypertension    Ischemic heart disease    Myocardial infarction ([place]) 2003   Osteopenia 01/2019   T score -1.1 stable from prior DEXA    Patient Active Problem List   Diagnosis Date Noted   S/P laparoscopic cholecystectomyAugust2020 11/29/2018   Benign hypertensive heart disease without heart failure 03/01/2013   Vaginal atrophy 05/03/2012   Myocardial infarction [facility])    Hyperlipidemia    Ischemic heart disease    Senile osteoporosis 02/27/2011   Breast cyst 02/27/2011   Hyperkalemia 01/28/2011   Old MI (myocardial infarction) 08/08/2010   Hypercholesterolemia 08/08/2010   Osteoporosis 08/08/2010    Past Surgical History:  Procedure Laterality Date   ABDOMINAL HYSTERECTOMY  1984   TAH bleeding adenomyosis   BREAST SURGERY     Breast bx   CATARACT EXTRACTION W/ INTRAOCULAR LENS IMPLANT Bilateral 11/2017   CHOLECYSTECTOMY N/A 11/29/2018   Procedure: LAPAROSCOPIC CHOLECYSTECTOMY WITH INTRAOPERATIVE CHOLANGIOGRAM;  Surgeon: Johnathan Hausen, MD;  Location: [facility];  Service: General;  Laterality: N/A;    OB History     Gravida  2   Para  2   Term  2   Preterm      AB      Living  2      SAB      IAB      Ectopic      Multiple      Live Births                Home Medications    Prior to Admission medications   Medication Sig Start Date End Date Taking? Authorizing Provider  BELSOMRA 20 MG TABS Take 1 tablet by mouth at bedtime as needed. 04/18/22  Yes [provider]  predniSONE (DELTASONE) 10 MG tablet take 1 tab three times a day for 2 days, 1 tab twice a day for 5 days, 1 tab daily till finished 01/08/22  Yes [provider]  Ascorbic Acid (VITA-C PO) Take 1,000 mg by mouth in the morning and at bedtime.    [provider]  benzonatate (TESSALON PERLES) 100 MG capsule Take 1 capsule (100 mg total) by mouth 3 (three) times daily as needed for cough. 04/10/22   Leath-Warren, Alda Lea, NP  Calcium Citrate-Vitamin D (CALCIUM + D PO) Take by mouth.    [provider]  Cholecalciferol (VITAMIN D) 1000 UNITS capsule Take 1,000 Units by mouth daily.    [provider]  clopidogrel (PLAVIX) 75 MG tablet Take 1 tablet (75 mg total) by mouth daily. 10/09/21   Freada Bergeron, MD  Coenzyme Q10 (COQ10) 100 MG CAPS Take 100 mg by mouth daily.    [provider]  fluticasone (FLONASE) 50 MCG/ACT nasal spray Place 2 sprays into both nostrils daily. 04/10/22   Leath-Warren, Alda Lea, NP  loperamide (IMODIUM) 2 MG capsule Take 1 capsule (2 mg total) by mouth 4 (four) times daily as needed for diarrhea or loose stools. 04/10/22   Leath-Warren, Alda Lea, NP  Methylcellulose, Laxative, (CITRUCEL PO) Take 1 packet by mouth at bedtime.    [provider]  metoprolol tartrate (LOPRESSOR) 50 MG tablet TAKE (1/2) TABLET BY MOUTH TWICE DAILY. 11/20/21   Freada Bergeron, MD  nitroGLYCERIN (NITROSTAT) 0.4 MG SL tablet Place 1 tablet (0.4 mg total) under the tongue every 5 (five) minutes as needed for chest pain. 09/16/21   Freada Bergeron, MD  NONFORMULARY OR COMPOUNDED ITEM Estradiol vaginal cream [id number] insert 1 applicator once weekly. 07/04/21   Princess Bruins, MD  Omega-3 Fatty Acids  (FISH OIL) 1200 MG CAPS Take 1,200 mg by mouth daily.    [provider]  rosuvastatin (CRESTOR) 10 MG tablet Take 1 tablet (10 mg total) by mouth daily. 05/07/22   Freada Bergeron, MD    Family History Family History  Problem Relation Age of Onset   Hypertension Mother    Heart disease Mother    Osteoporosis Mother    Heart disease Father    Colon cancer Father    Breast cancer Maternal Grandmother        Age unknown   Cancer Paternal Grandmother        Unknown type    Social History Social History   Tobacco Use   Smoking status: Never   Smokeless tobacco: Never  Vaping Use   Vaping Use: Never used  Substance Use Topics   Alcohol use: No    Alcohol/week: 0.0 standard drinks of alcohol   Drug use: No     Allergies   Codeine, Lipitor [atorvastatin calcium], and Simvastatin   Review of Systems Review of Systems Per HPI  Physical Exam Triage Vital Signs ED Triage Vitals  Enc Vitals Group     BP 06/08/22 1300 (!) 150/73     Pulse Rate 06/08/22 1300 72     Resp 06/08/22 1300 20     Temp 06/08/22 1300 97.6 F (36.4 C)     Temp Source 06/08/22 1300 Oral     SpO2 06/08/22 1300 98 %     Weight --      Height --      Head Circumference --      Peak Flow --      Pain Score 06/08/22 1302 4     Pain Loc --      Pain Edu? --      Excl. in [place]? --    No data found.  Updated Vital Signs BP (!) 150/73 (BP Location: Right Arm)   Pulse 72   Temp 97.6 F (36.4 C) (Oral)   Resp 20   SpO2 98%   Visual Acuity Right Eye Distance:   Left Eye Distance:   Bilateral Distance:    Right Eye Near:   Left Eye Near:    Bilateral Near:     Physical Exam Vitals and nursing note reviewed.  Constitutional:      Appearance: Normal appearance. She is not ill-appearing.  HENT:     Head: Atraumatic.  Eyes:     Extraocular Movements: Extraocular movements intact.     Conjunctiva/sclera: Conjunctivae normal.  Cardiovascular:     Rate and Rhythm: Normal rate  and regular rhythm.  Heart sounds: Normal heart sounds.  Pulmonary:     Effort: Pulmonary effort is normal.     Breath sounds: Normal breath sounds.  Musculoskeletal:        General: Swelling, tenderness and signs of injury present. No deformity. Normal range of motion.     Cervical back: Normal range of motion and neck supple.     Comments: Right lateral foot edematous, tender to palpation without bony deformity palpable.  Range of motion full and intact, normal gait  Skin:    General: Skin is warm and dry.     Findings: No bruising or erythema.  Neurological:     Mental Status: She is alert and oriented to person, place, and time.     Comments: Right lower extremity neurovascularly intact  Psychiatric:        Mood and Affect: Mood normal.        Thought Content: Thought content normal.        Judgment: Judgment normal.      UC Treatments / Results  Labs (all labs ordered are listed, but only abnormal results are displayed) Labs Reviewed - No data to display  EKG   Radiology DG Foot Complete Right  Result Date: 06/08/2022 CLINICAL DATA:  Fall EXAM: RIGHT FOOT COMPLETE - 3+ VIEW COMPARISON:  None Available. FINDINGS: There is no evidence of fracture or dislocation. 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 Prominent soft tissue swelling over the dorsum of the forefoot. IMPRESSION: 1. No acute fracture or dislocation of the right foot. 2. Prominent soft tissue swelling over the dorsum of the forefoot. Electronically Signed   By: Davina Poke D.O.   On: 06/08/2022 13:16    Procedures Procedures (including critical care time)  Medications Ordered in UC Medications - No data to display  Initial Impression / Assessment and Plan / UC Course  I have reviewed the triage vital signs and the nursing notes.  Pertinent labs & imaging results that were available during my care of the patient were  reviewed by me and considered in my medical decision making (see chart for details).     X-ray of the right foot negative for acute bony abnormality, Ace wrap applied today, discussed RICE protocol, over-the-counter pain relievers.  Return for worsening symptoms.  Final Clinical Impressions(s) / UC Diagnoses   Final diagnoses:  Acute right ankle pain     Discharge Instructions      Your x-ray was negative for any fractures today.  I believe you have sprained your ankle.  Treat with compression wraps, elevation, ice, Voltaren gel, Tylenol    ED Prescriptions   None    PDMP not reviewed this encounter.   Volney American, [place] 06/08/22 1447

## 2022-06-08 NOTE — Discharge Instructions (Signed)
Your x-ray was negative for any fractures today.  I believe you have sprained your ankle.  Treat with compression wraps, elevation, ice, Voltaren gel, Tylenol

## 2022-07-18 ENCOUNTER — Ambulatory Visit
Admission: EM | Admit: 2022-07-18 | Discharge: 2022-07-18 | Disposition: A | Discharge status: Home / Self Care | Payer: PPO

## 2022-07-18 ENCOUNTER — Ambulatory Visit (HOSPITAL_COMMUNITY): Admit: 2022-07-18 | Payer: PPO

## 2022-07-18 ENCOUNTER — Telehealth: Payer: Self-pay | Admitting: Nurse Practitioner

## 2022-07-18 ENCOUNTER — Other Ambulatory Visit: Payer: Self-pay | Admitting: Urgent Care

## 2022-07-18 ENCOUNTER — Ambulatory Visit (HOSPITAL_COMMUNITY)
Admission: RE | Admit: 2022-07-18 | Discharge: 2022-07-18 | Disposition: A | Discharge status: Home / Self Care | Payer: PPO | Source: Ambulatory Visit | Attending: Urgent Care | Admitting: Urgent Care

## 2022-07-18 ENCOUNTER — Other Ambulatory Visit (HOSPITAL_COMMUNITY): Payer: Self-pay | Admitting: Urgent Care

## 2022-07-18 ENCOUNTER — Encounter: Payer: Self-pay | Admitting: Emergency Medicine

## 2022-07-18 DIAGNOSIS — M79661 Pain in right lower leg: Secondary | ICD-10-CM | POA: Diagnosis not present

## 2022-07-18 DIAGNOSIS — M25471 Effusion, right ankle: Secondary | ICD-10-CM

## 2022-07-18 DIAGNOSIS — M7989 Other specified soft tissue disorders: Secondary | ICD-10-CM

## 2022-07-18 NOTE — ED Triage Notes (Signed)
Dull throbbing pain to right lower leg that radiates up to knee and thigh x 3 days.  Hx of sprain on 2/11 in right foot.

## 2022-07-18 NOTE — ED Provider Notes (Signed)
[facility]  Note:  This document was prepared using Dragon voice recognition software and may include unintentional dictation errors.  MRN: [id number] DOB: 1940-09-09  Subjective:   Kayla [name] is a 82 y.o. female presenting for 3-day history of acute onset persistent intermittent pains at different portions of her right lower extremity.  She was last seen for the same problem 06/08/2022. At that time her symptoms were related to an accidental fall, sprain/strain of her ankle/foot.  She had imaging done of her foot which was negative.  Feels like her injury never resolved.  Has since had pain along different aspects of her leg including the lateral thigh, posterior lateral hip and buttock, medial aspect of her right lower leg closer to the ankle.  Still has intermittent swelling over the ankle.  Today, patient expresses significant concern about having a blood clot. Reports that she had a blood clot in the groin area following heart catheterization.  She is not on anticoagulation for a clotting disorder, has not had a recurrence since.  She is on Plavix.  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  This was from an MRI done in 2009.  No changes to bowel or urinary habits.  No weakness, numbness or tingling.  No radicular symptoms.  No current facility-administered medications for this encounter.  Current Outpatient Medications:    Ascorbic Acid (VITA-C PO), Take 1,000 mg by mouth in the morning and at bedtime., Disp: , Rfl:    BELSOMRA 20 MG TABS, Take 1 tablet by mouth at bedtime as needed., Disp: , Rfl:    benzonatate (TESSALON PERLES) 100 MG capsule, Take 1 capsule (100 mg total) by mouth 3 (three) times daily as needed for cough., Disp: 30 capsule, Rfl: 0   Calcium Citrate-Vitamin D (CALCIUM + D PO), Take by mouth., Disp: , Rfl:    Cholecalciferol (VITAMIN D) 1000 UNITS  capsule, Take 1,000 Units by mouth daily., Disp: , Rfl:    clopidogrel (PLAVIX) 75 MG tablet, Take 1 tablet (75 mg total) by mouth daily., Disp: 90 tablet, Rfl: 3   Coenzyme Q10 (COQ10) 100 MG CAPS, Take 100 mg by mouth daily., Disp: , Rfl:    fluticasone (FLONASE) 50 MCG/ACT nasal spray, Place 2 sprays into both nostrils daily., Disp: 16 g, Rfl: 0   loperamide (IMODIUM) 2 MG capsule, Take 1 capsule (2 mg total) by mouth 4 (four) times daily as needed for diarrhea or loose stools., Disp: 12 capsule, Rfl: 0   Methylcellulose, Laxative, (CITRUCEL PO), Take 1 packet by mouth at bedtime., Disp: , Rfl:    metoprolol tartrate (LOPRESSOR) 50 MG tablet, TAKE (1/2) TABLET BY MOUTH TWICE DAILY., Disp: 90 tablet, Rfl: 2   nitroGLYCERIN (NITROSTAT) 0.4 MG SL tablet, Place 1 tablet (0.4 mg total) under the tongue every 5 (five) minutes as needed for chest pain., Disp: 25 tablet, Rfl: 3   NONFORMULARY OR COMPOUNDED ITEM, Estradiol vaginal cream [id number] insert 1 applicator once weekly., Disp: 90 each, Rfl: 2   Omega-3 Fatty Acids (FISH OIL) 1200 MG CAPS, Take 1,200 mg by mouth daily., Disp: , Rfl:    predniSONE (DELTASONE) 10 MG tablet, take 1 tab three times a day for 2 days, 1 tab twice a day for 5 days, 1 tab daily till finished, Disp: , Rfl:    rosuvastatin (CRESTOR) 10 MG tablet, Take 1 tablet (10 mg total) by mouth  daily., Disp: 90 tablet, Rfl: 1   Allergies  Allergen Reactions   Codeine Nausea Only   Lipitor [Atorvastatin Calcium] Other (See Comments)    Muscle aches   Simvastatin Other (See Comments)    Muscle aches    Past Medical History:  Diagnosis Date   Hyperlipidemia    Hypertension    Ischemic heart disease    Myocardial infarction ([place]) 2003   Osteopenia 01/2019   T score -1.1 stable from prior DEXA     Past Surgical History:  Procedure Laterality Date   ABDOMINAL HYSTERECTOMY  1984   TAH bleeding adenomyosis   BREAST SURGERY     Breast bx   CATARACT EXTRACTION W/ INTRAOCULAR  LENS IMPLANT Bilateral 11/2017   CHOLECYSTECTOMY N/A 11/29/2018   Procedure: LAPAROSCOPIC CHOLECYSTECTOMY WITH INTRAOPERATIVE CHOLANGIOGRAM;  Surgeon: Johnathan Hausen, MD;  Location: [facility];  Service: General;  Laterality: N/A;    Family History  Problem Relation Age of Onset   Hypertension Mother    Heart disease Mother    Osteoporosis Mother    Heart disease Father    Colon cancer Father    Breast cancer Maternal Grandmother        Age unknown   Cancer Paternal Grandmother        Unknown type    Social History   Tobacco Use   Smoking status: Never   Smokeless tobacco: Never  Vaping Use   Vaping Use: Never used  Substance Use Topics   Alcohol use: No    Alcohol/week: 0.0 standard drinks of alcohol   Drug use: No    ROS   Objective:   Vitals: BP (!) 147/73 (BP Location: Right Arm)   Pulse 73   Temp 97.7 F (36.5 C) (Oral)   Resp 18   SpO2 94%   Physical Exam Constitutional:      General: She is not in acute distress.    Appearance: Normal appearance. She is well-developed. She is not ill-appearing, toxic-appearing or diaphoretic.  HENT:     Head: Normocephalic and atraumatic.     Nose: Nose normal.     Mouth/Throat:     Mouth: Mucous membranes are moist.  Eyes:     General: No scleral icterus.       Right eye: No discharge.        Left eye: No discharge.     Extraocular Movements: Extraocular movements intact.  Cardiovascular:     Rate and Rhythm: Normal rate.  Pulmonary:     Effort: Pulmonary effort is normal.  Musculoskeletal:     Right upper leg: No swelling, edema, deformity, lacerations, tenderness or bony tenderness.     Right knee: No swelling, deformity, effusion, erythema, ecchymosis, lacerations, bony tenderness or crepitus. Normal range of motion. No tenderness. Normal alignment and normal patellar mobility.     Right lower leg: No swelling, deformity, lacerations, tenderness or bony tenderness. No edema.     Right ankle: Swelling present. No  deformity, ecchymosis or lacerations. No tenderness. Normal range of motion.     Right Achilles Tendon: No tenderness or defects. Thompson's test negative.     Comments: Negative Homans' sign.  Skin:    General: Skin is warm and dry.  Neurological:     General: No focal deficit present.     Mental Status: She is alert and oriented to person, place, and time.  Psychiatric:        Mood and Affect: Mood normal.  Behavior: Behavior normal.     Assessment and Plan :   PDMP not reviewed this encounter.  1. Right ankle swelling   2. Pain in right lower leg     Due to patient's significant concern for dvt, I am pursuing an ultrasound.  She has low Wells criteria, negative Bevelyn Buckles' sign and physical exam findings that are not consistent with this.  However as this was the patient's top priority, I was agreeable to help her with this. RN Ricard Dillon was able to secure an appointment today and therefore the patient had to leave the clinic abruptly so that they could fit her in before they left for the day.  Will discuss case with NP Leath for follow-up with the results.   Jaynee Eagles, [place] 07/18/22 [phone]

## 2022-07-18 NOTE — Discharge Instructions (Addendum)
RN Manuela Schwartz was able to schedule an appointment for an ultrasound right now.  Please had to [facility] to get this done.

## 2022-07-18 NOTE — Telephone Encounter (Signed)
Received patient's ultrasound results.  Call patient, verified patient with name and date of birth.  Informed patient that the ultrasound of her right lower extremity was negative.  Patient was advised to follow-up with her PCP for continued symptoms. Patient verbalized understanding, all questions were answered.

## 2022-07-22 ENCOUNTER — Encounter: Payer: Self-pay | Admitting: Nurse Practitioner

## 2022-07-22 ENCOUNTER — Ambulatory Visit (INDEPENDENT_AMBULATORY_CARE_PROVIDER_SITE_OTHER): Payer: PPO | Admitting: Nurse Practitioner

## 2022-07-22 VITALS — BP 112/78 | HR 58 | Temp 98.4°F

## 2022-07-22 DIAGNOSIS — N9489 Other specified conditions associated with female genital organs and menstrual cycle: Secondary | ICD-10-CM

## 2022-07-22 DIAGNOSIS — R102 Pelvic and perineal pain: Secondary | ICD-10-CM

## 2022-07-22 DIAGNOSIS — N760 Acute vaginitis: Secondary | ICD-10-CM | POA: Diagnosis not present

## 2022-07-22 LAB — WET PREP FOR TRICH, YEAST, CLUE

## 2022-07-22 MED ORDER — METRONIDAZOLE 500 MG PO TABS: 500.0000 mg | ORAL_TABLET | Freq: Two times a day (BID) | ORAL | 0 refills | Status: DC

## 2022-07-22 NOTE — Progress Notes (Signed)
   Acute Office Visit  Subjective:    Patient ID: Kayla [name], female    DOB: Jun 30, 1940, 82 y.o.   MRN: [id number]   HPI 82 y.o. presents today for pelvic pain that she describes as dull and vulvar burning that's mostly on right side x 3 days. Denies urinary frequency, urgency, hematuria, flank pain, or dysuria. H/O atrophic vaginitis and recurrent UTI symptoms. These have been managed well with vaginal estrogen in the past. Stopped vaginal estrogen about a month ago per Dr. Dellis Filbert. Was using about once per week prior to discontinuing.   Review of Systems  Constitutional: Negative.   Genitourinary:  Positive for pelvic pain and vaginal pain (Vulvar). Negative for dysuria, flank pain, frequency, genital sores, hematuria, urgency and vaginal discharge.       Objective:    Physical Exam Constitutional:      Appearance: Normal appearance.  Genitourinary:    Comments: Mild redness on posterior outer labia majora and along perineum    BP 112/78   Pulse (!) 58   Temp 98.4 F (36.9 C) (Oral)   SpO2 97%  Wt Readings from Last 3 Encounters:  09/16/21 161 lb 9.6 oz (73.3 kg)  07/04/21 163 lb (73.9 kg)  03/13/21 158 lb (71.7 kg)        Patient informed chaperone available to be present for breast and/or pelvic exam. Patient has requested no chaperone to be present. Patient has been advised what will be completed during breast and pelvic exam.   UA negative  Wet prep + clue cells (+ odor)  Assessment & Plan:   Problem List Items Addressed This Visit   None Visit Diagnoses     Bacterial vaginosis    -  Primary   Relevant Medications   metroNIDAZOLE (FLAGYL) 500 MG tablet   Pelvic pain       Relevant Orders   Urinalysis,Complete w/RFL Culture (Completed)   Vulvar burning       Relevant Orders   WET PREP FOR [place], YEAST, CLUE (Completed)      Plan: Wet prep positive for clue cells - Flagyl 500 mg BID x 7 days. UA unremarkable.      [place], 1:37  PM 07/22/2022

## 2022-07-24 LAB — URINALYSIS, COMPLETE W/RFL CULTURE
Bilirubin Urine: NEGATIVE
Casts: NONE SEEN /LPF
Crystals: NONE SEEN /HPF
Glucose, UA: NEGATIVE
Hgb urine dipstick: NEGATIVE
Hyaline Cast: NONE SEEN /LPF
Ketones, ur: NEGATIVE
Leukocyte Esterase: NEGATIVE
Nitrites, Initial: NEGATIVE
Protein, ur: NEGATIVE
RBC / HPF: NONE SEEN /HPF (ref 0–2)
Specific Gravity, Urine: 1.01 (ref 1.001–1.035)
Yeast: NONE SEEN /HPF
pH: 5.5 (ref 5.0–8.0)

## 2022-07-24 LAB — URINE CULTURE
MICRO NUMBER:: 14742738
Result:: NO GROWTH
SPECIMEN QUALITY:: ADEQUATE

## 2022-07-24 LAB — CULTURE INDICATED

## 2022-08-19 ENCOUNTER — Telehealth: Payer: Self-pay | Admitting: Cardiology

## 2022-08-19 MED ORDER — METOPROLOL TARTRATE 50 MG PO TABS: ORAL_TABLET | ORAL | 0 refills | Status: DC

## 2022-08-19 NOTE — Telephone Encounter (Signed)
Pt's medication was sent to pt's pharmacy as requested. Confirmation received.  °

## 2022-08-19 NOTE — Telephone Encounter (Signed)
*  STAT* If patient is at the pharmacy, call can be transferred to refill team.   1. Which medications need to be refilled? (please list name of each medication and dose if known) metoprolol tartrate (LOPRESSOR) 50 MG tablet   2. Which pharmacy/location (including street and city if local pharmacy) is medication to be sent to? [facility] - [place], [place] - [address]   3. Do they need a 30 day or 90 day supply? 90

## 2022-09-16 ENCOUNTER — Telehealth: Payer: Self-pay

## 2022-09-16 NOTE — Telephone Encounter (Signed)
   Pre-operative Risk Assessment    Patient Name: Kayla [name]  DOB: [birth year] MRN: [id number]      Request for Surgical Clearance    Procedure:   LEFT  C7-T1 I-1 ESI EPIDURAL STEROID INJECTION    Date of Surgery:  Clearance TBD                                 Surgeon:  DR. Romero Belling Surgeon's Group or Practice Name:  [facility]  Phone number:  [phone] Fax number:  [phone] ATTN INJECTION SCHEDULING    Type of Clearance Requested:   - Pharmacy:  Hold Clopidogrel (Plavix) NEEDS INSTRUCTIONS WHEN TO HOLD   Type of Anesthesia:  Not Indicated   Additional requests/questions:    SignedMichaelle Copas   09/16/2022, 3:52 PM

## 2022-09-16 NOTE — Telephone Encounter (Signed)
   Name: Kayla [name]  DOB: 1940/11/22  MRN: [id number]  Primary Cardiologist: Meriam Sprague, MD  Chart reviewed as part of pre-operative protocol coverage. The patient has an upcoming visit scheduled with Perlie Gold, PA on 10/08/2022 at which time clearance can be addressed in case there are any issues that would impact surgical recommendations.  LEFT  C7-T1 I-1 ESI EPIDURAL STEROID INJECTION is not scheduled until TBDas below. I added preop FYI to appointment note so that provider is aware to address at time of outpatient visit.  Per office protocol the cardiology provider should forward their finalized clearance decision and recommendations regarding antiplatelet therapy to the requesting party below.    Per office protocol, if patient is without any new symptoms or concerns at the time of their visit, she may hold Plavix for 5 days prior to procedure. Please resume Plavix as soon as possible postprocedure, at the discretion of the surgeon.    I will route this message as FYI to requesting party and remove this message from the preop box as separate preop APP input not needed at this time.   Please call with any questions.  Joylene Grapes, NP  09/16/2022, 4:37 PM

## 2022-10-07 ENCOUNTER — Other Ambulatory Visit: Payer: Self-pay | Admitting: *Deleted

## 2022-10-07 MED ORDER — CLOPIDOGREL BISULFATE 75 MG PO TABS: 75.0000 mg | ORAL_TABLET | Freq: Every day | ORAL | 0 refills | Status: DC

## 2022-10-08 ENCOUNTER — Ambulatory Visit (INDEPENDENT_AMBULATORY_CARE_PROVIDER_SITE_OTHER): Payer: PPO

## 2022-10-08 ENCOUNTER — Ambulatory Visit: Payer: PPO | Attending: Cardiology | Admitting: Cardiology

## 2022-10-08 ENCOUNTER — Encounter: Payer: Self-pay | Admitting: Cardiology

## 2022-10-08 VITALS — BP 122/80 | HR 66 | Ht 68.0 in | Wt 161.8 lb

## 2022-10-08 DIAGNOSIS — E785 Hyperlipidemia, unspecified: Secondary | ICD-10-CM | POA: Diagnosis not present

## 2022-10-08 DIAGNOSIS — I2510 Atherosclerotic heart disease of native coronary artery without angina pectoris: Secondary | ICD-10-CM | POA: Diagnosis not present

## 2022-10-08 DIAGNOSIS — I2109 ST elevation (STEMI) myocardial infarction involving other coronary artery of anterior wall: Secondary | ICD-10-CM

## 2022-10-08 DIAGNOSIS — I340 Nonrheumatic mitral (valve) insufficiency: Secondary | ICD-10-CM | POA: Diagnosis not present

## 2022-10-08 DIAGNOSIS — Z01818 Encounter for other preprocedural examination: Secondary | ICD-10-CM

## 2022-10-08 DIAGNOSIS — I10 Essential (primary) hypertension: Secondary | ICD-10-CM

## 2022-10-08 DIAGNOSIS — I491 Atrial premature depolarization: Secondary | ICD-10-CM

## 2022-10-08 DIAGNOSIS — E7800 Pure hypercholesterolemia, unspecified: Secondary | ICD-10-CM

## 2022-10-08 MED ORDER — METOPROLOL SUCCINATE ER 50 MG PO TB24: 50.0000 mg | ORAL_TABLET | Freq: Every day | ORAL | 3 refills | Status: DC

## 2022-10-08 MED ORDER — ROSUVASTATIN CALCIUM 20 MG PO TABS: 20.0000 mg | ORAL_TABLET | Freq: Every day | ORAL | 3 refills | Status: DC

## 2022-10-08 NOTE — Assessment & Plan Note (Signed)
The patient does not have any unstable cardiac conditions.  Upon evaluation today, she can achieve 4 METs or greater without anginal symptoms.  According to [facility] and AHA guidelines, she requires no further cardiac workup prior to her noncardiac surgery and should be at acceptable risk.  Our service is available as necessary in the perioperative period. Per office protocol, patient may hold Plavix for 5 days prior to her procedure. She should resume as soon as cleared by procedural/surgical team.

## 2022-10-08 NOTE — Assessment & Plan Note (Signed)
Patient with mild to moderate MR noted on 2018 TTE. Improved to trivial on repeat study in 2022. No murmur on physical exam today. Will repeat echocardiogram given frequent PACs today.

## 2022-10-08 NOTE — Patient Instructions (Signed)
Medication Instructions:   CHANGE to Toprol XL one (1) tablet by mouth ( 50 mg ) daily.   INCREASE Rosuvastatin one (1) tablet by mouth ( 20 mg) every evening.  You can take two (2) of your ( 10 mg) tablets by mouth at the same time ( 20 mg) until you finish up.   *If you need a refill on your cardiac medications before your next appointment, please call your pharmacy*   Lab Work:  None ordered.  If you have labs (blood work) drawn today and your tests are completely normal, you will receive your results only by: MyChart Message (if you have MyChart) OR A paper copy in the mail If you have any lab test that is abnormal or we need to change your treatment, we will call you to review the results.   Testing/Procedures:  Your physician has requested that you have an echocardiogram. Echocardiography is a painless test that uses sound waves to create images of your heart. It provides your doctor with information about the size and shape of your heart and how well your heart's chambers and valves are working. This procedure takes approximately one hour. There are no restrictions for this procedure. Please do NOT wear cologne, perfume or lotions (deodorant is allowed). Please arrive 15 minutes prior to your appointment time.   ZIO XT- Long Term Monitor Instructions  Your physician has requested you wear a ZIO patch monitor for 14 days.  This is a single patch monitor. [employer] supplies one patch monitor per enrollment. Additional stickers are not available. Please do not apply patch if you will be having a Nuclear Stress Test,  Echocardiogram, Cardiac CT, MRI, or Chest Xray during the period you would be wearing the  monitor. The patch cannot be worn during these tests. You cannot remove and re-apply the  ZIO XT patch monitor.  Your ZIO patch monitor will be mailed 3 day USPS to your address on file. It may take 3-5 days  to receive your monitor after you have been enrolled.  Once you have  received your monitor, please review the enclosed instructions. Your monitor  has already been registered assigning a specific monitor serial # to you.  Billing and Patient Assistance Program Information  We have supplied [employer] with any of your insurance information on file for billing purposes. [employer] offers a sliding scale Patient Assistance Program for patients that do not have  insurance, or whose insurance does not completely cover the cost of the ZIO monitor.  You must apply for the Patient Assistance Program to qualify for this discounted rate.  To apply, please call [employer] at [phone], select option 4, select option 2, ask to apply for  Patient Assistance Program. Meredeth Ide will ask your household income, and how many people  are in your household. They will quote your out-of-pocket cost based on that information.  [employer] will also be able to set up a 29-month, interest-free payment plan if needed.  Applying the monitor   Shave hair from upper left chest.  Hold abrader disc by orange tab. Rub abrader in 40 strokes over the upper left chest as  indicated in your monitor instructions.  Clean area with 4 enclosed alcohol pads. Let dry.  Apply patch as indicated in monitor instructions. Patch will be placed under collarbone on left  side of chest with arrow pointing upward.  Rub patch adhesive wings for 2 minutes. Remove white label marked "1". Remove the white  label marked "2". Rub patch  adhesive wings for 2 additional minutes.  While looking in a mirror, press and release button in center of patch. A small green light will  flash 3-4 times. This will be your only indicator that the monitor has been turned on.  Do not shower for the first 24 hours. You may shower after the first 24 hours.  Press the button if you feel a symptom. You will hear a small click. Record Date, Time and  Symptom in the Patient Logbook.  When you are ready to remove the patch, follow instructions on  the last 2 pages of Patient  Logbook. Stick patch monitor onto the last page of Patient Logbook.  Place Patient Logbook in the blue and white box. Use locking tab on box and tape box closed  securely. The blue and white box has prepaid postage on it. Please place it in the mailbox as  soon as possible. Your physician should have your test results approximately 7 days after the  monitor has been mailed back to [facility].  Call [facility] Customer Care at [phone] if you have questions regarding  your ZIO XT patch monitor. Call them immediately if you see an orange light blinking on your  monitor.  If your monitor falls off in less than 4 days, contact our Monitor department at [phone].  If your monitor becomes loose or falls off after 4 days call [employer] at [phone] for  suggestions on securing your monitor  Follow-Up: At [facility], you and your health needs are our priority.  As part of our continuing mission to provide you with exceptional heart care, we have created designated Provider Care Teams.  These Care Teams include your primary Cardiologist (physician) and Advanced Practice Providers (APPs -  Physician Assistants and Nurse Practitioners) who all work together to provide you with the care you need, when you need it.  We recommend signing up for the patient portal called "MyChart".  Sign up information is provided on this After Visit Summary.  MyChart is used to connect with patients for Virtual Visits (Telemedicine).  Patients are able to view lab/test results, encounter notes, upcoming appointments, etc.  Non-urgent messages can be sent to your provider as well.   To learn more about what you can do with MyChart, go to [email or web address].    Your next appointment:   6 month(s)  Provider:   Pollyann Glen, PA-C         Other Instructions  Your physician wants you to follow-up in: 6 months with Pollyann Glen PA-C. You will receive a reminder  letter in the mail two months in advance. If you don't receive a letter, please call our office to schedule the follow-up appointment.

## 2022-10-08 NOTE — Assessment & Plan Note (Signed)
Patient with prior MI in 2003. 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 No PCI performed. Patient without anginal symptoms or new/worsening exertional dyspnea. Continue secondary prevention with lipid management. Continue Plavix (patient prefers monotherapy with this over ASA). Continue Metoprolol. Will consolidate to Toprol XL 50mg .

## 2022-10-08 NOTE — Assessment & Plan Note (Signed)
Patient with frequent ectopic beats on physical exam today, 2 PACs noted on 10 second 12 lead ECG strip. Patient asymptomatic but given significant number on physical exam, will check burden on 3 day Zio. Will also update echocardiogram given PACs with history of mild->moderate MR (subsequently improved to trivial on 2022 echo). Will transition Metoprolol Tartrate to Succinate 50mg  to provide more stable rate control.

## 2022-10-08 NOTE — Progress Notes (Unsigned)
Applied a 3 day Zio XT monitor to patient in the office  [place] to read

## 2022-10-08 NOTE — Progress Notes (Signed)
Cardiology Office Note:    Date:  10/08/2022   ID:  Kayla [name], DOB [birth year], MRN [id number]  PCP:  Ralene Ok, MD  [facility] Providers Cardiologist:  Meriam Sprague, MD   Referring MD: Ralene Ok, MD   Chief Complaint:  No chief complaint on file.    Patient Profile:  CAD with MI in 2003 Coronary report from 01/13/2002 demonstrated a large first diagonal branch with a proximal 95% stenosis. 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 No PCI performed.  Hypertension Hyperlipidemia Arthritis  Cardiac Studies & Procedures     STRESS TESTS  NM MYOCAR MULTI W/SPECT W 04/27/2017  Narrative  Blood pressure demonstrated a normal response to exercise.  There was no ST segment deviation noted during stress.  7 beat run of atrial tachycardia during recovery asymptomatic.  The study is normal. There are no perfusion defects  This is a low risk study.  The left ventricular ejection fraction is hyperdynamic (>65%).  Elevated TID of 1.47 in setting of otherwise completely normal study, nonspecific finding.   ECHOCARDIOGRAM  ECHOCARDIOGRAM COMPLETE 08/07/2020  Narrative ECHOCARDIOGRAM REPORT    Patient Name:   Kayla [name] Harps Date of Exam: 08/07/2020 Medical Rec #:  [id number]      Height:       68.0 in Accession #:    [id number]     Weight:       151.4 lb Date of Birth:  08-Mar-1941      BSA:          1.816 m Patient Age:    82 years       BP:           120/84 mmHg Patient Gender: F              HR:           72 bpm. Exam Location:  [address]  Procedure: 2D Echo, 3D Echo, Cardiac Doppler and Color Doppler  Indications:    I34.0 Mitral Regurgitation  History:        Patient has prior history of Echocardiogram examinations, most recent 04/23/2017. Previous Myocardial Infarction and CAD, Mitral Valve Disease; Risk Factors:Hypertension, Family History of Coronary Artery Disease and Dyslipidemia.  Edema.  Sonographer:    Farrel Conners RDCS Referring Phys: [id number] [facility]ATHER E [place]  IMPRESSIONS   1. Left ventricular ejection fraction, by estimation, is 60 to 65%. The left ventricle has normal function. The left ventricle has no regional wall motion abnormalities. Left ventricular diastolic parameters are consistent with Grade I diastolic dysfunction (impaired relaxation). 2. Right ventricular systolic function is normal. The right ventricular size is normal. There is normal pulmonary artery systolic pressure. The estimated right ventricular systolic pressure is 29.2 mmHg. 3. Left atrial size was mildly dilated. 4. The mitral valve is abnormal. Trivial mitral valve regurgitation. 5. The aortic valve is tricuspid. Aortic valve regurgitation is not visualized. Mild aortic valve sclerosis is present, with no evidence of aortic valve stenosis. 6. The inferior vena cava is normal in size with greater than 50% respiratory variability, suggesting right atrial pressure of 3 mmHg.  Comparison(s): Changes from prior study are noted. 04/23/2017: LVEF 60-65%, mild LVH, mild to moderate MR.  FINDINGS Left Ventricle: Left ventricular ejection fraction, by estimation, is 60 to 65%. The left ventricle has normal function. The left ventricle has no regional wall motion abnormalities. The left ventricular internal cavity size was normal in  size. There is no left ventricular hypertrophy. Left ventricular diastolic parameters are consistent with Grade I diastolic dysfunction (impaired relaxation). Indeterminate filling pressures.  Right Ventricle: The right ventricular size is normal. No increase in right ventricular wall thickness. Right ventricular systolic function is normal. There is normal pulmonary artery systolic pressure. The tricuspid regurgitant velocity is 2.56 m/s, and with an assumed right atrial pressure of 3 mmHg, the estimated right ventricular systolic pressure is 29.2 mmHg.  Left  Atrium: Left atrial size was mildly dilated.  Right Atrium: Right atrial size was normal in size.  Pericardium: There is no evidence of pericardial effusion.  Mitral Valve: The mitral valve is abnormal. There is mild thickening of the mitral valve leaflet(s). Trivial mitral valve regurgitation.  Tricuspid Valve: The tricuspid valve is grossly normal. Tricuspid valve regurgitation is mild.  Aortic Valve: The aortic valve is tricuspid. Aortic valve regurgitation is not visualized. Mild aortic valve sclerosis is present, with no evidence of aortic valve stenosis.  Pulmonic Valve: The pulmonic valve was normal in structure. Pulmonic valve regurgitation is not visualized.  Aorta: The aortic root and ascending aorta are structurally normal, with no evidence of dilitation.  Venous: The inferior vena cava is normal in size with greater than 50% respiratory variability, suggesting right atrial pressure of 3 mmHg.  IAS/Shunts: No atrial level shunt detected by color flow Doppler.   LEFT VENTRICLE PLAX 2D LVIDd:         3.80 cm  Diastology LVIDs:         2.60 cm  LV e' medial:    6.85 cm/s LV PW:         0.90 cm  LV E/e' medial:  13.7 LV IVS:        0.60 cm  LV e' lateral:   7.09 cm/s LVOT diam:     2.00 cm  LV E/e' lateral: 13.3 LV SV:         78 LV SV Index:   43 LVOT Area:     3.14 cm  3D Volume EF: 3D EF:        68 % LV EDV:       106 ml LV ESV:       33 ml LV SV:        73 ml  RIGHT VENTRICLE RV S prime:     15.50 cm/s TAPSE (M-mode): 3.2 cm  LEFT ATRIUM             Index       RIGHT ATRIUM           Index LA diam:        3.10 cm 1.71 cm/m  RA Area:     16.70 cm LA Vol (A2C):   57.2 ml 31.50 ml/m RA Volume:   42.00 ml  23.13 ml/m LA Vol (A4C):   71.8 ml 39.54 ml/m LA Biplane Vol: 66.8 ml 36.79 ml/m AORTIC VALVE LVOT Vmax:   107.00 cm/s LVOT Vmean:  67.750 cm/s LVOT VTI:    0.248 m  AORTA Ao Root diam: 3.20 cm Ao Asc diam:  3.40 cm  MITRAL VALVE                 TRICUSPID VALVE MV Area (PHT): cm          TR Peak grad:   26.2 mmHg MV Decel Time: 238 msec     TR Vmax:        256.00 cm/s MV E velocity: 94.10  cm/s MV A velocity: 112.00 cm/s  SHUNTS MV E/A ratio:  0.84         Systemic VTI:  0.25 m Systemic Diam: 2.00 cm  Zoila Shutter MD Electronically signed by Zoila Shutter MD Signature Date/Time: 08/07/2020/3:52:27 PM    Final              History of Present Illness:   Kayla [name] is a 82 y.o. female with the above problem list.  She presents today for regular cardiology follow-up as well as pre-procedural evaluation. Patient has pending left C7-T1 I-1 ESI epidural steroid injection. Patient was last seen by Dr. Shari Prows in May of 2023. Since [name] last visit with [place], patient feels that she is doing well from cardiovascular standpoint. She unfortunately sprained [name] right ankle coming down the stairs in March and has been exertionally limited as a result. She has had intermittent lower extremity edema for a while now and initially noticed increased ankle edema in right leg following sprain. However, reports overall stable lower extremity edema and says that it resolves overnight or with elevation of feet. Since being less active, has noticed decrease in exertional endurance. Ankle has improved in recent weeks and she is now working to increase activity. She is able to walk up [name] driveway which is inclined without chest pain and minimal dyspnea. She also denies angina or dyspnea going up a flight of stairs.   In addition to ankle injury, she has also had significant neck/back pain that has led to pending epidural steroid injection with ortho. Pain has significantly improved in the last few days though so now she questions whether she should hold off on the injection.  Today patient denies chest pain, shortness of breath, lower extremity edema, fatigue, palpitations, melena, hematuria, hemoptysis, diaphoresis, weakness, presyncope, syncope, orthopnea,  and PND.      Past Medical History:  Diagnosis Date   Hyperlipidemia    Hypertension    Ischemic heart disease    Myocardial infarction [facility]) 2003   Osteopenia 01/2019   T score -1.1 stable from prior DEXA   Current Medications: Current Meds  Medication Sig   Ascorbic Acid (VITA-C PO) Take 1,000 mg by mouth in the morning and at bedtime.   BELSOMRA 20 MG TABS Take 1 tablet by mouth at bedtime as needed. Per patient taking 1/4 mg of 20mg  tablet.   Calcium Citrate-Vitamin D (CALCIUM + D PO) Take by mouth.   Cholecalciferol (VITAMIN D) 1000 UNITS capsule Take 1,000 Units by mouth daily.   clopidogrel (PLAVIX) 75 MG tablet Take 1 tablet (75 mg total) by mouth daily.   Coenzyme Q10 (COQ10) 100 MG CAPS Take 100 mg by mouth daily.   Methylcellulose, Laxative, (CITRUCEL PO) Take 1 packet by mouth at bedtime.   metoprolol succinate (TOPROL-XL) 50 MG 24 hr tablet Take 1 tablet (50 mg total) by mouth daily. Take with or immediately following a meal.   nitroGLYCERIN (NITROSTAT) 0.4 MG SL tablet Place 1 tablet (0.4 mg total) under the tongue every 5 (five) minutes as needed for chest pain.   Omega-3 Fatty Acids (FISH OIL) 1200 MG CAPS Take 1,200 mg by mouth daily.   [DISCONTINUED] metoprolol tartrate (LOPRESSOR) 50 MG tablet TAKE (1/2) TABLET BY MOUTH TWICE DAILY.   [DISCONTINUED] rosuvastatin (CRESTOR) 10 MG tablet Take 1 tablet (10 mg total) by mouth daily.    Allergies:   Codeine, Lipitor [atorvastatin calcium], and Simvastatin   Social History   Occupational History   Not  on file  Tobacco Use   Smoking status: Never   Smokeless tobacco: Never  Vaping Use   Vaping Use: Never used  Substance and Sexual Activity   Alcohol use: No    Alcohol/week: 0.0 standard drinks of alcohol   Drug use: No   Sexual activity: Not Currently    Partners: Male    Birth control/protection: Surgical, Abstinence    Comment: 1st intercourse 82 yo-Fewer than 5 partners    Family Hx: The patient's  family history includes Breast cancer in [name] maternal grandmother; Cancer in [name] paternal grandmother; Colon cancer in [name] father; Heart disease in [name] father and mother; Hypertension in [name] mother; Osteoporosis in [name] mother.  Review of Systems  Constitutional: Negative for weight gain and weight loss.  Cardiovascular:  Positive for leg swelling (chronic and improves with elevation). Negative for chest pain, dyspnea on exertion, irregular heartbeat, orthopnea, palpitations and syncope.  Respiratory:  Negative for cough, shortness of breath and snoring.   All other systems reviewed and are negative.    EKGs/Labs/Other Test Reviewed:    EKG:  EKG is ordered today.  The ekg ordered today demonstrates sinus rhythm with frequent PACs.   Recent Labs: No results found for requested labs within last 365 days.   Recent Lipid Panel No results for input(s): "CHOL", "TRIG", "HDL", "VLDL", "LDLCALC", "LDLDIRECT" in the last 8760 hours.    Risk Assessment/Calculations/Metrics:              Physical Exam:    VS:  BP 122/80   Pulse 66   Ht 5\' 8"  (1.727 m)   Wt 161 lb 12.8 oz (73.4 kg)   SpO2 98%   BMI 24.60 kg/m     Wt Readings from Last 3 Encounters:  10/08/22 161 lb 12.8 oz (73.4 kg)  09/16/21 161 lb 9.6 oz (73.3 kg)  07/04/21 163 lb (73.9 kg)    Constitutional:      Appearance: Healthy appearance. Not in distress.  Neck:     Vascular: No JVR. JVD normal.  Pulmonary:     Effort: Pulmonary effort is normal.     Breath sounds: Normal breath sounds. No wheezing. No rhonchi. No rales.  Chest:     Chest wall: Not tender to palpatation.  Cardiovascular:     PMI at left midclavicular line. Normal rate. Frequent ectopic beats. Regular rhythm. Normal S1. Normal S2.      Murmurs: There is no murmur.     No gallop.  No click. No rub.  Pulses:    Intact distal pulses.  Edema:    Peripheral edema present.    Feet: bilateral trace non-pitting edema of the feet. Abdominal:      General: Bowel sounds are normal.     Palpations: Abdomen is soft.     Tenderness: There is no abdominal tenderness.  Musculoskeletal: Normal range of motion.        General: No tenderness. Skin:    General: Skin is warm and dry.  Neurological:     General: No focal deficit present.     Mental Status: Alert and oriented to person, place and time.         ASSESSMENT & PLAN:   Myocardial infarction [facility]) Patient with prior MI in 2003. 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 No PCI performed. Patient without anginal symptoms or  new/worsening exertional dyspnea. Continue secondary prevention with lipid management. Continue Plavix (patient prefers monotherapy with this over ASA). Continue Metoprolol. Will consolidate to Toprol XL 50mg .  Hypercholesterolemia Patient with LDL 88 per April 2024 PCP labs. Shared decision with patient to increase Crestor to 20mg  with LDL goal <70.  PAC (premature atrial contraction) Patient with frequent ectopic beats on physical exam today, 2 PACs noted on 10 second 12 lead ECG strip. Patient asymptomatic but given significant number on physical exam, will check burden on 3 day Zio. Will also update echocardiogram given PACs with history of mild->moderate MR (subsequently improved to trivial on 2022 echo). Will transition Metoprolol Tartrate to Succinate 50mg  to provide more stable rate control.  Encounter for preoperative examination for general surgical procedure The patient does not have any unstable cardiac conditions.  Upon evaluation today, she can achieve 4 METs or greater without anginal symptoms.  According to [facility] and AHA guidelines, she requires no further cardiac workup prior to [name] noncardiac surgery and should be at acceptable risk.  Our service is available as necessary in the perioperative period. Per  office protocol, patient may hold Plavix for 5 days prior to [name] procedure. She should resume as soon as cleared by procedural/surgical team.   Mitral regurgitation Patient with mild to moderate MR noted on 2018 TTE. Improved to trivial on repeat study in 2022. No murmur on physical exam today. Will repeat echocardiogram given frequent PACs today.            Dispo:  Return in about 6 months (around 04/09/2023) for Routine follow up in 6 months with Adora Fridge.   Medication Adjustments/Labs and Tests Ordered: Current medicines are reviewed at length with the patient today.  Concerns regarding medicines are outlined above.  Tests Ordered: Orders Placed This Encounter  Procedures   LONG TERM MONITOR (3-14 DAYS)   EKG 12-Lead   ECHOCARDIOGRAM COMPLETE   Medication Changes: Meds ordered this encounter  Medications   metoprolol succinate (TOPROL-XL) 50 MG 24 hr tablet    Sig: Take 1 tablet (50 mg total) by mouth daily. Take with or immediately following a meal.    Dispense:  90 tablet    Refill:  3   rosuvastatin (CRESTOR) 20 MG tablet    Sig: Take 1 tablet (20 mg total) by mouth daily.    Dispense:  90 tablet    Refill:  3   Signed, Perlie Gold, PA-C  10/08/2022 4:14 PM    [facility] [address] [place], [place], [place]  [address] Phone: [phone]; Fax: [phone]

## 2022-10-08 NOTE — Assessment & Plan Note (Signed)
Patient with LDL 88 per April 2024 PCP labs. Shared decision with patient to increase Crestor to 20mg  with LDL goal <70.

## 2022-11-05 ENCOUNTER — Ambulatory Visit (HOSPITAL_COMMUNITY): Payer: PPO | Attending: Internal Medicine

## 2022-11-05 DIAGNOSIS — I2510 Atherosclerotic heart disease of native coronary artery without angina pectoris: Secondary | ICD-10-CM | POA: Diagnosis present

## 2022-11-05 DIAGNOSIS — E785 Hyperlipidemia, unspecified: Secondary | ICD-10-CM | POA: Diagnosis present

## 2022-11-05 DIAGNOSIS — I491 Atrial premature depolarization: Secondary | ICD-10-CM | POA: Diagnosis present

## 2022-11-05 DIAGNOSIS — I10 Essential (primary) hypertension: Secondary | ICD-10-CM | POA: Diagnosis present

## 2022-11-05 DIAGNOSIS — I340 Nonrheumatic mitral (valve) insufficiency: Secondary | ICD-10-CM

## 2022-11-05 LAB — ECHOCARDIOGRAM COMPLETE
Area-P 1/2: 3.59 cm2
S' Lateral: 2.5 cm

## 2022-12-15 ENCOUNTER — Telehealth: Payer: Self-pay

## 2022-12-15 NOTE — Telephone Encounter (Signed)
Please schedule an office appointment for this week.  I am sorry that we do not have any openings today.   If the patient is really uncomfortable, I recommend a visit to urgent care.

## 2022-12-15 NOTE — Telephone Encounter (Signed)
Former ML pt LVM requesting appt for today. Requesting a CB.  Pt reports sxs of dull throbbing in pelvic area and vulvar burning. Denies bothersome with urination. Denies any discharge or odor.  States Dr. Penni Bombard used to have her on a compounded low dose estrogen vaginal cream that she used once weekly and she never had a problem. However, states since seeing ML, she has not had a refill of this and has been w/o it for a while now and feels as if sxs are a result of that.   Per ML's notes from visit 07/04/2021-"Her atrophic vaginitis and recurrent dysuria symptoms have greatly improved using the vaginal estradiol cream. She is now using the cream once a week. Will refill, but recommend weaning and trying without cream in the course of the year."  Please advise.

## 2022-12-15 NOTE — Telephone Encounter (Signed)
Spoke with patient, advised per Dr. Edward Jolly. Patient declines appt with NP. OV scheduled for 8/20 at 1500 with BS. ER/urgent care precautions reviewed.   Routing to provider for final review. Patient is agreeable to disposition. Will close encounter.

## 2022-12-16 ENCOUNTER — Ambulatory Visit (INDEPENDENT_AMBULATORY_CARE_PROVIDER_SITE_OTHER): Payer: PPO | Admitting: Obstetrics and Gynecology

## 2022-12-16 ENCOUNTER — Encounter: Payer: Self-pay | Admitting: Obstetrics and Gynecology

## 2022-12-16 VITALS — BP 124/76 | HR 66 | Ht 67.5 in | Wt 163.0 lb

## 2022-12-16 DIAGNOSIS — N3090 Cystitis, unspecified without hematuria: Secondary | ICD-10-CM

## 2022-12-16 DIAGNOSIS — R102 Pelvic and perineal pain: Secondary | ICD-10-CM

## 2022-12-16 DIAGNOSIS — N952 Postmenopausal atrophic vaginitis: Secondary | ICD-10-CM

## 2022-12-16 DIAGNOSIS — N760 Acute vaginitis: Secondary | ICD-10-CM

## 2022-12-16 LAB — WET PREP FOR TRICH, YEAST, CLUE

## 2022-12-16 MED ORDER — SULFAMETHOXAZOLE-TRIMETHOPRIM 800-160 MG PO TABS: 1.0000 | ORAL_TABLET | Freq: Two times a day (BID) | ORAL | 0 refills | Status: DC

## 2022-12-16 MED ORDER — ESTRADIOL 0.1 MG/GM VA CREA: TOPICAL_CREAM | VAGINAL | 0 refills | Status: DC

## 2022-12-16 NOTE — Progress Notes (Unsigned)
GYNECOLOGY  VISIT   HPI: 82 y.o.   Widowed  Caucasian  female   [name] with No LMP recorded. Patient has had a hysterectomy.   here for pelvic pain. Pt has dull throbbing and burning in the pelvic area for many years now. No issues with urination. Pt feels this is more external and causes pain on the right side. Vaginal estrogen helped this pt but prior provider discontinued this medication.   Not a constant pain.  Bothers her every day.   No dysuria.    Treated for BV in March, 2024.  This did not help her symptoms.   GYNECOLOGIC HISTORY: No LMP recorded. Patient has had a hysterectomy. Contraception:  hyst Menopausal hormone therapy:  n/a Last mammogram: 2023 per pt, 04/16/21 Breast Density Cat B, BI-RADS CAT 1 neg Last pap smear:   2011        OB History     Gravida  2   Para  2   Term  2   Preterm      AB      Living  2      SAB      IAB      Ectopic      Multiple      Live Births                 Patient Active Problem List   Diagnosis Date Noted   PAC (premature atrial contraction) 10/08/2022   Encounter for preoperative examination for general surgical procedure 10/08/2022   Mitral regurgitation 10/08/2022   S/P laparoscopic cholecystectomyAugust2020 11/29/2018   Benign hypertensive heart disease without heart failure 03/01/2013   Vaginal atrophy 05/03/2012   Myocardial infarction [facility])    Hyperlipidemia    Ischemic heart disease    Senile osteoporosis 02/27/2011   Breast cyst 02/27/2011   Hyperkalemia 01/28/2011   Old MI (myocardial infarction) 08/08/2010   Hypercholesterolemia 08/08/2010   Osteoporosis 08/08/2010    Past Medical History:  Diagnosis Date   Hyperlipidemia    Hypertension    Ischemic heart disease    Myocardial infarction (HCC) 2003   Osteopenia 01/2019   T score -1.1 stable from prior DEXA    Past Surgical History:  Procedure Laterality Date   ABDOMINAL HYSTERECTOMY  1984   TAH bleeding adenomyosis   BREAST  SURGERY     Breast bx   CATARACT EXTRACTION W/ INTRAOCULAR LENS IMPLANT Bilateral 11/2017   CHOLECYSTECTOMY N/A 11/29/2018   Procedure: LAPAROSCOPIC CHOLECYSTECTOMY WITH INTRAOPERATIVE CHOLANGIOGRAM;  Surgeon: Luretha Murphy, MD;  Location: [facility];  Service: General;  Laterality: N/A;    Current Outpatient Medications  Medication Sig Dispense Refill   Ascorbic Acid (VITA-C PO) Take 1,000 mg by mouth in the morning and at bedtime.     BELSOMRA 20 MG TABS Take 1 tablet by mouth at bedtime as needed. Per patient taking 1/4 mg of 20mg  tablet.     Calcium Citrate-Vitamin D (CALCIUM + D PO) Take by mouth.     Cholecalciferol (VITAMIN D) 1000 UNITS capsule Take 1,000 Units by mouth daily.     clopidogrel (PLAVIX) 75 MG tablet Take 1 tablet (75 mg total) by mouth daily. 90 tablet 0   Coenzyme Q10 (COQ10) 100 MG CAPS Take 100 mg by mouth daily.     Methylcellulose, Laxative, (CITRUCEL PO) Take 1 packet by mouth at bedtime.     metoprolol succinate (TOPROL-XL) 50 MG 24 hr tablet Take 1 tablet (50 mg total) by mouth daily.  Take with or immediately following a meal. 90 tablet 3   nitroGLYCERIN (NITROSTAT) 0.4 MG SL tablet Place 1 tablet (0.4 mg total) under the tongue every 5 (five) minutes as needed for chest pain. 25 tablet 3   Omega-3 Fatty Acids (FISH OIL) 1200 MG CAPS Take 1,200 mg by mouth daily.     rosuvastatin (CRESTOR) 20 MG tablet Take 1 tablet (20 mg total) by mouth daily. 90 tablet 3   traMADol (ULTRAM) 50 MG tablet Take 50 mg by mouth 3 (three) times daily as needed.     No current facility-administered medications for this visit.     ALLERGIES: Codeine, Lipitor [atorvastatin calcium], and Simvastatin  Family History  Problem Relation Age of Onset   Hypertension Mother    Heart disease Mother    Osteoporosis Mother    Heart disease Father    Colon cancer Father    Breast cancer Maternal Grandmother        Age unknown   Cancer Paternal Grandmother        Unknown type    Social  History   Socioeconomic History   Marital status: Widowed    Spouse name: Not on file   Number of children: Not on file   Years of education: Not on file   Highest education level: Not on file  Occupational History   Not on file  Tobacco Use   Smoking status: Never   Smokeless tobacco: Never  Vaping Use   Vaping status: Never Used  Substance and Sexual Activity   Alcohol use: No    Alcohol/week: 0.0 standard drinks of alcohol   Drug use: No   Sexual activity: Not Currently    Partners: Male    Birth control/protection: Surgical, Abstinence    Comment: 1st intercourse 82 yo-Fewer than 5 partners  Other Topics Concern   Not on file  Social History Narrative   Not on file   Social Determinants of Health   Financial Resource Strain: Not on file  Food Insecurity: Not on file  Transportation Needs: Not on file  Physical Activity: Not on file  Stress: Not on file  Social Connections: Not on file  Intimate Partner Violence: Not on file    Review of Systems  Genitourinary:  Positive for pelvic pain.    PHYSICAL EXAMINATION:    BP 124/76 (BP Location: Right Arm, Patient Position: Sitting, Cuff Size: Normal)   Pulse 66   Ht 5' 7.5" (1.715 m)   Wt 163 lb (73.9 kg)   SpO2 100%   BMI 25.15 kg/m     General appearance: alert, cooperative and appears stated age  Pelvic: External genitalia:  erythema of the vulva.  Slit in the skin on right lateral vulva.              Urethra:  normal appearing urethra with no masses, tenderness or lesions              Bartholins and Skenes: normal                 Vagina: normal appearing vagina with normal color and discharge, no lesions              Cervix: no lesions                Bimanual Exam:  Uterus:  normal size, contour, position, consistency, mobility, non-tender              Adnexa: no mass, fullness, tenderness  Chaperone was present for exam:  Warren Lacy, CMA  ASSESSMENT  Vulvovaginitis.  Vaginal atrophy. Pelvic pain.   Cystitis.    PLAN  Wet prep:  negative yeast, neg clue cells, neg trichomonas.  Urinalysis:  sg 1.010, ph 5.5, 10 -20 WBC, 6 - 10 squams, few bacteria.  UC sent.  Bactrim DS po bid x 3 bid. Estradiol cream prescribed.  Get copy of mammogram from [facility] for 2023.  Fu in 4 weeks.  Consider pelvic US if not improved.    25 min  total time was spent for this patient encounter, including preparation, face-to-face counseling with the patient, coordination of care, and documentation of the encounter.

## 2022-12-18 LAB — URINALYSIS, COMPLETE W/RFL CULTURE
Bilirubin Urine: NEGATIVE
Glucose, UA: NEGATIVE
Hgb urine dipstick: NEGATIVE
Hyaline Cast: NONE SEEN /LPF
Ketones, ur: NEGATIVE
Nitrites, Initial: NEGATIVE
Protein, ur: NEGATIVE
RBC / HPF: NONE SEEN /HPF (ref 0–2)
Specific Gravity, Urine: 1.01 (ref 1.001–1.035)
pH: 5.5 (ref 5.0–8.0)

## 2022-12-18 LAB — URINE CULTURE
MICRO NUMBER:: 15355585
SPECIMEN QUALITY:: ADEQUATE

## 2022-12-18 LAB — CULTURE INDICATED

## 2022-12-30 NOTE — Progress Notes (Signed)
GYNECOLOGY  VISIT   HPI: 82 y.o.   Widowed  Caucasian  female   [name] with No LMP recorded. Patient has had a hysterectomy.   here for   4 week recheck-pelvic pain and throbbing. Symptoms have resolved, except last afternoon pt had external burning throbbing.  The throbbing was initially on the vulvar area.  It is more sensitive on the right than on the left.  Sitting makes it worse.  The pain has also been internal in the pelvis.  Has used the new prescription for compounded vaginal estradiol cream.  Using internally in the vagina.  GYNECOLOGIC HISTORY: No LMP recorded. Patient has had a hysterectomy. Contraception:  hyst Menopausal hormone therapy:  n/a Last mammogram:   2023 per pt, 04/16/21 Breast Density Cat B, BI-RADS CAT 1 neg  Last pap smear:   2011        OB History     Gravida  2   Para  2   Term  2   Preterm      AB      Living  2      SAB      IAB      Ectopic      Multiple      Live Births                 Patient Active Problem List   Diagnosis Date Noted   PAC (premature atrial contraction) 10/08/2022   Encounter for preoperative examination for general surgical procedure 10/08/2022   Mitral regurgitation 10/08/2022   S/P laparoscopic cholecystectomyAugust2020 11/29/2018   Benign hypertensive heart disease without heart failure 03/01/2013   Vaginal atrophy 05/03/2012   Myocardial infarction [facility])    Hyperlipidemia    Ischemic heart disease    Senile osteoporosis 02/27/2011   Breast cyst 02/27/2011   Hyperkalemia 01/28/2011   Old MI (myocardial infarction) 08/08/2010   Hypercholesterolemia 08/08/2010   Osteoporosis 08/08/2010    Past Medical History:  Diagnosis Date   Hyperlipidemia    Hypertension    Ischemic heart disease    Myocardial infarction (HCC) 2003   Osteopenia 01/2019   T score -1.1 stable from prior DEXA    Past Surgical History:  Procedure Laterality Date   ABDOMINAL HYSTERECTOMY  1984   TAH bleeding  adenomyosis   BREAST SURGERY     Breast bx   CATARACT EXTRACTION W/ INTRAOCULAR LENS IMPLANT Bilateral 11/2017   CHOLECYSTECTOMY N/A 11/29/2018   Procedure: LAPAROSCOPIC CHOLECYSTECTOMY WITH INTRAOPERATIVE CHOLANGIOGRAM;  Surgeon: Luretha Murphy, MD;  Location: [facility];  Service: General;  Laterality: N/A;    Current Outpatient Medications  Medication Sig Dispense Refill   Ascorbic Acid (VITA-C PO) Take 1,000 mg by mouth in the morning and at bedtime.     BELSOMRA 20 MG TABS Take 1 tablet by mouth at bedtime as needed. Per patient taking 1/4 mg of 20mg  tablet.     Calcium Citrate-Vitamin D (CALCIUM + D PO) Take by mouth.     Cholecalciferol (VITAMIN D) 1000 UNITS capsule Take 1,000 Units by mouth daily.     clopidogrel (PLAVIX) 75 MG tablet Take 1 tablet (75 mg total) by mouth daily. 90 tablet 2   Coenzyme Q10 (COQ10) 100 MG CAPS Take 100 mg by mouth daily.     Methylcellulose, Laxative, (CITRUCEL PO) Take 1 packet by mouth at bedtime.     nitroGLYCERIN (NITROSTAT) 0.4 MG SL tablet Place 1 tablet (0.4 mg total) under the tongue every 5 (five)  minutes as needed for chest pain. 25 tablet 3   NONFORMULARY OR COMPOUNDED ITEM Estradiol vaginal cream 0.02%.  1 ml prefilled applicator.  Place one applicator in the vagina twice a week.  Disp:  24 applicators,  RF:  none.  [facility]. 24 each 0   Omega-3 Fatty Acids (FISH OIL) 1200 MG CAPS Take 1,200 mg by mouth daily.     rosuvastatin (CRESTOR) 20 MG tablet Take 1 tablet (20 mg total) by mouth daily. 90 tablet 3   sulfamethoxazole-trimethoprim (BACTRIM DS) 800-160 MG tablet Take 1 tablet by mouth 2 (two) times daily. One PO BID x 3 days 6 tablet 0   traMADol (ULTRAM) 50 MG tablet Take 50 mg by mouth 3 (three) times daily as needed.     metoprolol succinate (TOPROL-XL) 50 MG 24 hr tablet Take 1 tablet (50 mg total) by mouth daily. Take with or immediately following a meal. 90 tablet 3   No current facility-administered medications for this  visit.     ALLERGIES: Codeine, Lipitor [atorvastatin calcium], and Simvastatin  Family History  Problem Relation Age of Onset   Hypertension Mother    Heart disease Mother    Osteoporosis Mother    Heart disease Father    Colon cancer Father    Breast cancer Maternal Grandmother        Age unknown   Cancer Paternal Grandmother        Unknown type    Social History   Socioeconomic History   Marital status: Widowed    Spouse name: Not on file   Number of children: Not on file   Years of education: Not on file   Highest education level: Not on file  Occupational History   Not on file  Tobacco Use   Smoking status: Never   Smokeless tobacco: Never  Vaping Use   Vaping status: Never Used  Substance and Sexual Activity   Alcohol use: No    Alcohol/week: 0.0 standard drinks of alcohol   Drug use: No   Sexual activity: Not Currently    Partners: Male    Birth control/protection: Surgical, Abstinence    Comment: 1st intercourse 82 yo-Fewer than 5 partners  Other Topics Concern   Not on file  Social History Narrative   Not on file   Social Determinants of Health   Financial Resource Strain: Not on file  Food Insecurity: Not on file  Transportation Needs: Not on file  Physical Activity: Not on file  Stress: Not on file  Social Connections: Not on file  Intimate Partner Violence: Not on file    Review of Systems  All other systems reviewed and are negative.   PHYSICAL EXAMINATION:    BP 126/70 (BP Location: Right Arm, Patient Position: Sitting, Cuff Size: Normal)   Pulse 76   Ht 5' 7.5" (1.715 m)   Wt 163 lb (73.9 kg)   SpO2 100%   BMI 25.15 kg/m     General appearance: alert, cooperative and appears stated age   Pelvic: External genitalia:  no lesions              Urethra:  normal appearing urethra with no masses, tenderness or lesions              Bartholins and Skenes: normal                 Vagina: normal appearing vagina with normal color and  discharge, no lesions  Cervix: no lesions                Bimanual Exam:  Uterus:  normal size, contour, position, consistency, mobility, non-tender              Adnexa: no mass, fullness, tenderness    Chaperone was present for exam:  Warren Lacy, CMA  ASSESSMENT  Pelvic pain.  Vulvar pain.  Status post hysterectomy.  Ovaries remain.   PLAN  Use compounded estradiol cream in vagina and on vulva twice weekly.  Proceed with pelvic ultrasound.  Consider gabapentin cream if not responding to the estradiol and if no significant findings on pelvic US.  Fu after [place] complete.

## 2022-12-31 ENCOUNTER — Telehealth: Payer: Self-pay | Admitting: *Deleted

## 2022-12-31 NOTE — Telephone Encounter (Signed)
Spoke with patient. Patient was seen in office on 12/16/22, started vaginal estradiol cream, has been on medication for 2 weeks, no change in symptoms. Reports continued external pelvic discomfort and dull ache. Patient is requesting to switch to compounded estrogen cream that worked for her in the past. Has f/u scheduled for 01/13/23 with Dr. Edward Jolly. Request Rx to [facility] if appropriate. Completed Bactrim as prescribed. Denies any new or worsening symptoms.   OV note reviewed, PUS discussed if symptoms not improved, patient agreeable to schedule if medication change not appropriate. Advised will send to Dr. Edward Jolly to review and f/u. Patient agreeable.   Dr. Edward Jolly -please review and advise.

## 2023-01-01 ENCOUNTER — Other Ambulatory Visit: Payer: Self-pay | Admitting: Obstetrics and Gynecology

## 2023-01-01 MED ORDER — NONFORMULARY OR COMPOUNDED ITEM: 0 refills | Status: DC

## 2023-01-01 NOTE — Telephone Encounter (Signed)
Call placed to [facility], spoke with pharmacist, ED .  Verbal order given for : Estradiol vaginal cream 0.02%. 1 ml prefilled applicator. Place one applicator in the vagina twice a week. Disp: 24 applicators, RF: none.   Order Read back and confirmed.    Spoke with patient. Advised per Dr. Edward Jolly. Patient verbalizes understanding and is agreeable.   Encounter closed.

## 2023-01-01 NOTE — Progress Notes (Signed)
Rx for vaginal estradiol changed to compounded.

## 2023-01-01 NOTE — Telephone Encounter (Signed)
I changed the patient's prescription for vaginal estradiol to 0.02% compounded type.  Rx will need to be faxed to [facility].   I recommend patient keep her appointment for her recheck with me this month.

## 2023-01-06 ENCOUNTER — Other Ambulatory Visit: Payer: Self-pay

## 2023-01-06 MED ORDER — CLOPIDOGREL BISULFATE 75 MG PO TABS: 75.0000 mg | ORAL_TABLET | Freq: Every day | ORAL | 2 refills | Status: DC

## 2023-01-06 NOTE — Telephone Encounter (Signed)
Pt's medication was sent to pt's pharmacy as requested. Confirmation received.  °

## 2023-01-13 ENCOUNTER — Telehealth: Payer: Self-pay | Admitting: Obstetrics and Gynecology

## 2023-01-13 ENCOUNTER — Ambulatory Visit (INDEPENDENT_AMBULATORY_CARE_PROVIDER_SITE_OTHER): Payer: PPO | Admitting: Obstetrics and Gynecology

## 2023-01-13 ENCOUNTER — Encounter: Payer: Self-pay | Admitting: Obstetrics and Gynecology

## 2023-01-13 VITALS — BP 126/70 | HR 76 | Ht 67.5 in | Wt 163.0 lb

## 2023-01-13 DIAGNOSIS — R102 Pelvic and perineal pain: Secondary | ICD-10-CM | POA: Diagnosis not present

## 2023-01-13 LAB — LAB REPORT - SCANNED: EGFR: 67

## 2023-01-13 NOTE — Telephone Encounter (Signed)
IMG order placed.  MyChart message sent.

## 2023-01-13 NOTE — Telephone Encounter (Signed)
Please schedule a pelvic ultrasound for my patient who has pelvic pain.

## 2023-01-13 NOTE — Telephone Encounter (Signed)
Last read by Leland Her at  6:06 PM on 01/13/2023.   Routing to Dr. Marjorie Smolder.   Encounter closed.

## 2023-01-15 ENCOUNTER — Ambulatory Visit (HOSPITAL_COMMUNITY)
Admission: RE | Admit: 2023-01-15 | Discharge: 2023-01-15 | Disposition: A | Discharge status: Home / Self Care | Payer: PPO | Source: Ambulatory Visit | Attending: Obstetrics and Gynecology | Admitting: Obstetrics and Gynecology

## 2023-01-15 DIAGNOSIS — R102 Pelvic and perineal pain: Secondary | ICD-10-CM | POA: Insufficient documentation

## 2023-01-26 ENCOUNTER — Telehealth: Payer: Self-pay | Admitting: *Deleted

## 2023-01-26 NOTE — Telephone Encounter (Signed)
Patient left voicemail requesting PUS results from imaging dated 01/15/23  Call placed to [facility] at [phone], spoke with [facility], requested imaging to be read.   Spoke with patient, update provided. Advised once results received and reveiwed by your provider our office will f/u. Dr. Edward Jolly is out of the office today, returning on 10/1.Patient verbalizes understanding.   Routing to Dr. Edward Jolly.

## 2023-01-27 NOTE — Telephone Encounter (Signed)
The patient's pelvic ultrasound did not identify her ovaries.  This just means that they are not enlarged.  It is not unusual to not see the ovaries on pelvic ultrasound.   Please have her see me back in the office if her pain is not responding to the compounded vaginal estrogen cream after 6 weeks of therapy.   We discussed the possibility of compounded gabapentin cream for the vulva.

## 2023-01-28 NOTE — Telephone Encounter (Signed)
Pt notified and voiced understanding. Routing back to provider for final review and encounter closed.

## 2023-02-17 ENCOUNTER — Telehealth: Payer: Self-pay

## 2023-02-17 NOTE — Telephone Encounter (Signed)
I am so glad she is doing better since using the vaginal estrogen!   I recommend she continue this twice weekly.  She can contact the office for refills if needed.   She is due for her mammogram in December.   Please schedule her next routine visit with me for March, 2025.

## 2023-02-17 NOTE — Telephone Encounter (Signed)
Patient called and left message on triage voicemail. She states that she is doing much better after using the estradiol compound. She is inquiring about the next step since she has seen improvement. She would like to know if she should continue to use the medication. Please advise. Can return her call at [phone].

## 2023-02-18 NOTE — Telephone Encounter (Signed)
Patient advised. She said she will call for appointment.

## 2023-02-27 ENCOUNTER — Telehealth: Payer: Self-pay | Admitting: Cardiovascular Disease

## 2023-02-27 NOTE — Telephone Encounter (Signed)
Patient is requesting to speak directly with Dr. Earmon Phoenix nurse. She states she was advised to ask for her in order to establish as Dr. Earmon Phoenix patient. She states her brother sees Dr. Excell Seltzer and she was advised that an exception can be made for her given Dr. Excell Seltzer knows about her family history. Patient states this has already been discussed with Dr. Earmon Phoenix nurse. Please advise.

## 2023-05-14 ENCOUNTER — Ambulatory Visit: Payer: PPO | Attending: Cardiovascular Disease | Admitting: Cardiovascular Disease

## 2023-05-14 ENCOUNTER — Ambulatory Visit: Payer: PPO | Admitting: Cardiology

## 2023-05-14 ENCOUNTER — Encounter: Payer: Self-pay | Admitting: Cardiovascular Disease

## 2023-05-14 VITALS — BP 130/90 | HR 69 | Ht 68.0 in | Wt 159.6 lb

## 2023-05-14 DIAGNOSIS — I2510 Atherosclerotic heart disease of native coronary artery without angina pectoris: Secondary | ICD-10-CM

## 2023-05-14 DIAGNOSIS — I491 Atrial premature depolarization: Secondary | ICD-10-CM

## 2023-05-14 DIAGNOSIS — E785 Hyperlipidemia, unspecified: Secondary | ICD-10-CM

## 2023-05-14 DIAGNOSIS — I340 Nonrheumatic mitral (valve) insufficiency: Secondary | ICD-10-CM

## 2023-05-14 DIAGNOSIS — I10 Essential (primary) hypertension: Secondary | ICD-10-CM | POA: Diagnosis not present

## 2023-05-14 MED ORDER — ROSUVASTATIN CALCIUM 10 MG PO TABS: 10.0000 mg | ORAL_TABLET | Freq: Every day | ORAL | 3 refills | Status: AC

## 2023-05-14 MED ORDER — EZETIMIBE 10 MG PO TABS: 10.0000 mg | ORAL_TABLET | Freq: Every day | ORAL | 3 refills | Status: DC

## 2023-05-14 NOTE — Patient Instructions (Signed)
Medication Instructions:  DECREASE Crestor/Rosuvastatin to 10mg  daily START Zetia/Ezetimibe 10mg  daily *If you need a refill on your cardiac medications before your next appointment, please call your pharmacy*  Lab Work: Lipids, Liver in 3-4 months If you have labs (blood work) drawn today and your tests are completely normal, you will receive your results only by: MyChart Message (if you have MyChart) OR A paper copy in the mail If you have any lab test that is abnormal or we need to change your treatment, we will call you to review the results.  Follow-Up: At [facility], you and your health needs are our priority.  As part of our continuing mission to provide you with exceptional heart care, we have created designated Provider Care Teams.  These Care Teams include your primary Cardiologist (physician) and Advanced Practice Providers (APPs -  Physician Assistants and Nurse Practitioners) who all work together to provide you with the care you need, when you need it.  Your next appointment:   1 year(s)  Provider:   Tonny Bollman, MD     1st Floor: - Lobby - Registration  - Pharmacy  - Lab - Cafe  2nd Floor: - PV Lab - Diagnostic Testing (echo, CT, nuclear med)  3rd Floor: - Vacant  4th Floor: - TCTS (cardiothoracic surgery) - AFib Clinic - Structural Heart Clinic - Vascular Surgery  - Vascular Ultrasound  5th Floor: - [facility] (general and EP) - Clinical Pharmacy for coumadin, hypertension, lipid, weight-loss medications, and med management appointments    Valet parking services will be available as well.

## 2023-05-14 NOTE — Progress Notes (Signed)
Cardiology Office Note:    Date:  05/14/2023   ID:  Kayla [name], DOB 1941/04/03, MRN [id number]  PCP:  Ralene Ok, MD   [facility] Providers Cardiologist:  Tonny Bollman, MD     Referring MD: Ralene Ok, MD   Chief Complaint  Patient presents with   Coronary Artery Disease    History of Present Illness:    Kayla [name] is a 83 y.o. female with a hx of:  CAD with MI in 2003 Coronary report from 01/13/2002 demonstrated a large first diagonal branch with a proximal 95% stenosis. 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 No PCI performed.  Hypertension Hyperlipidemia Arthritis  Patient presents today for follow-up of the problems outlined above.  She had an updated echo in July 2024 demonstrating normal LVEF of 60 to 65% with mild LVH and no regional wall motion abnormalities.  This demonstrated normal RV function, mild mitral regurgitation, and mild left atrial dilatation.  An outpatient monitor demonstrated normal sinus rhythm with an average heart rate of 67 bpm, short runs of SVT (longest 15 beats), and 10% supraventricular ectopics.  She is doing well from a cardiac perspective and denies any chest pain, chest pressure, shortness of breath, or heart palpitations.  She has had some increased problems with joint pain since increasing rosuvastatin from 10 up to 20 mg daily.  Current Medications: Current Meds  Medication Sig   Ascorbic Acid (VITA-C PO) Take 1,000 mg by mouth in the morning and at bedtime.   BELSOMRA 20 MG TABS Take 1 tablet by mouth at bedtime as needed. Per patient taking 1/4 mg of 20mg  tablet.   Calcium Citrate-Vitamin D (CALCIUM + D PO) Take by mouth.   Cholecalciferol (VITAMIN D) 1000 UNITS capsule Take 1,000 Units by mouth daily.   clopidogrel (PLAVIX) 75 MG tablet Take 1 tablet (75 mg total) by mouth daily.   Coenzyme Q10 (COQ10) 100 MG CAPS Take 100 mg by mouth daily.   ezetimibe (ZETIA) 10  MG tablet Take 1 tablet (10 mg total) by mouth daily.   Methylcellulose, Laxative, (CITRUCEL PO) Take 1 packet by mouth at bedtime.   nitroGLYCERIN (NITROSTAT) 0.4 MG SL tablet Place 1 tablet (0.4 mg total) under the tongue every 5 (five) minutes as needed for chest pain.   NONFORMULARY OR COMPOUNDED ITEM Estradiol vaginal cream 0.02%.  1 ml prefilled applicator.  Place one applicator in the vagina twice a week.  Disp:  24 applicators,  RF:  none.  [facility].   Omega-3 Fatty Acids (FISH OIL) 1200 MG CAPS Take 1,200 mg by mouth daily.   rosuvastatin (CRESTOR) 10 MG tablet Take 1 tablet (10 mg total) by mouth daily.   traMADol (ULTRAM) 50 MG tablet Take 50 mg by mouth 3 (three) times daily as needed.   vitamin B-12 (CYANOCOBALAMIN) 500 MCG tablet Take 500 mcg by mouth once a week.   [DISCONTINUED] rosuvastatin (CRESTOR) 20 MG tablet Take 1 tablet (20 mg total) by mouth daily.   [DISCONTINUED] sulfamethoxazole-trimethoprim (BACTRIM DS) 800-160 MG tablet Take 1 tablet by mouth 2 (two) times daily. One PO BID x 3 days     Allergies:   Codeine, Lipitor [atorvastatin calcium], and Simvastatin   ROS:   Please see the history of present illness.    All other systems reviewed and are negative.  EKGs/Labs/Other Studies Reviewed:    The following studies were reviewed today: Cardiac Studies &  Procedures     STRESS TESTS  NM MYOCAR MULTI W/SPECT W 04/27/2017  Narrative  Blood pressure demonstrated a normal response to exercise.  There was no ST segment deviation noted during stress.  7 beat run of atrial tachycardia during recovery asymptomatic.  The study is normal. There are no perfusion defects  This is a low risk study.  The left ventricular ejection fraction is hyperdynamic (>65%).  Elevated TID of 1.47 in setting of otherwise completely normal study, nonspecific finding.  ECHOCARDIOGRAM  ECHOCARDIOGRAM COMPLETE 11/05/2022  Narrative ECHOCARDIOGRAM  REPORT    Patient Name:   Kayla [name] Layne Date of Exam: 11/05/2022 Medical Rec #:  [id number]      Height:       68.0 in Accession #:    [id number]     Weight:       161.8 lb Date of Birth:  [birth year]      BSA:          1.868 m Patient Age:    81 years       BP:           122/80 mmHg Patient Gender: F              HR:           63 bpm. Exam Location:  [address]  Procedure: 2D Echo, Cardiac Doppler and Color Doppler  Indications:    I34.0 Mitral Regurgitation  History:        Patient has prior history of Echocardiogram examinations, most recent 08/07/2020. Previous Myocardial Infarction; Risk Factors:Hypertension and Dyslipidemia.  Sonographer:    Daphine Deutscher RDCS Referring Phys: [id number] Perlie Gold  IMPRESSIONS   1. Left ventricular ejection fraction, by estimation, is 60 to 65%. The left ventricle has normal function. The left ventricle has no regional wall motion abnormalities. There is mild left ventricular hypertrophy. Left ventricular diastolic parameters are consistent with Grade I diastolic dysfunction (impaired relaxation). 2. Right ventricular systolic function is normal. The right ventricular size is normal. There is normal pulmonary artery systolic pressure. 3. Left atrial size was mildly dilated. 4. The mitral valve is grossly normal. Mild mitral valve regurgitation. 5. The aortic valve is tricuspid. Aortic valve regurgitation is not visualized. 6. The inferior vena cava is normal in size with greater than 50% respiratory variability, suggesting right atrial pressure of 3 mmHg.  FINDINGS Left Ventricle: Left ventricular ejection fraction, by estimation, is 60 to 65%. The left ventricle has normal function. The left ventricle has no regional wall motion abnormalities. The left ventricular internal cavity size was normal in size. There is mild left ventricular hypertrophy. Left ventricular diastolic parameters are consistent with Grade I diastolic  dysfunction (impaired relaxation).  Right Ventricle: The right ventricular size is normal. Right ventricular systolic function is normal. There is normal pulmonary artery systolic pressure. The tricuspid regurgitant velocity is 2.61 m/s, and with an assumed right atrial pressure of 3 mmHg, the estimated right ventricular systolic pressure is 30.2 mmHg.  Left Atrium: Left atrial size was mildly dilated.  Right Atrium: Right atrial size was normal in size.  Pericardium: There is no evidence of pericardial effusion.  Mitral Valve: The mitral valve is grossly normal. Mild mitral valve regurgitation.  Tricuspid Valve: The tricuspid valve is normal in structure. Tricuspid valve regurgitation is mild.  Aortic Valve: The aortic valve is tricuspid. Aortic valve regurgitation is not visualized.  Pulmonic Valve: The pulmonic valve was normal in structure. Pulmonic valve regurgitation is trivial.  Aorta: The aortic root and ascending aorta are structurally normal, with no evidence of dilitation.  Venous: The inferior vena cava is normal in size with greater than 50% respiratory variability, suggesting right atrial pressure of 3 mmHg.  IAS/Shunts: No atrial level shunt detected by color flow Doppler.   LEFT VENTRICLE PLAX 2D LVIDd:         4.00 cm   Diastology LVIDs:         2.50 cm   LV e' medial:    6.96 cm/s LV PW:         1.10 cm   LV E/e' medial:  12.8 LV IVS:        1.10 cm   LV e' lateral:   8.22 cm/s LVOT diam:     1.80 cm   LV E/e' lateral: 10.9 LV SV:         62 LV SV Index:   33 LVOT Area:     2.54 cm   RIGHT VENTRICLE             IVC RV Basal diam:  4.00 cm     IVC diam: 1.40 cm RV S prime:     15.25 cm/s TAPSE (M-mode): 2.8 cm  LEFT ATRIUM             Index        RIGHT ATRIUM           Index LA diam:        4.40 cm 2.36 cm/m   RA Area:     14.10 cm LA Vol (A2C):   57.2 ml 30.63 ml/m  RA Volume:   37.00 ml  19.81 ml/m LA Vol (A4C):   60.4 ml 32.34 ml/m LA Biplane  Vol: 62.8 ml 33.62 ml/m AORTIC VALVE LVOT Vmax:   106.00 cm/s LVOT Vmean:  69.050 cm/s LVOT VTI:    0.244 m  AORTA Ao Root diam: 2.90 cm Ao Asc diam:  3.50 cm  MITRAL VALVE                TRICUSPID VALVE MV Area (PHT): 3.59 cm     TR Peak grad:   27.2 mmHg MV Decel Time: 212 msec     TR Vmax:        261.00 cm/s MV E velocity: 89.15 cm/s MV A velocity: 104.40 cm/s  SHUNTS MV E/A ratio:  0.85         Systemic VTI:  0.24 m Systemic Diam: 1.80 cm  Carolan Clines Electronically signed by Carolan Clines Signature Date/Time: 11/05/2022/5:03:03 PM    Final   MONITORS  LONG TERM MONITOR (3-14 DAYS) 10/15/2022  Narrative   Patch wear time was 3 days   Predominant rhythm was NSR with average HR 67bpm   There were 73 runs of nonsustained SVT with longest lasting 15 beats   Frequen SVE (10.5%), rare VE (<1%)   No sustained arrhythmias or significant pauses   No patient triggered events   Patch Wear Time:  3 days and 0 hours (2024-06-12T15:58:46-0400 to 2024-06-15T16:15:55-0400)  Patient had a min HR of 49 bpm, max HR of 167 bpm, and avg HR of 67 bpm. Predominant underlying rhythm was Sinus Rhythm. 73 Supraventricular Tachycardia runs occurred, the run with the fastest interval lasting 7 beats with a max rate of 167 bpm, the longest lasting 15 beats with an avg rate of 112 bpm. Isolated SVEs were frequent (10.5%, 51884), SVE Couplets were occasional (3.2%, 4732), and no SVE  Triplets were present. Isolated VEs were rare (<1.0%), and no VE Couplets or VE Triplets were present.  Kayla Flatten, MD           EKG:        Recent Labs: No results found for requested labs within last 365 days.  Recent Lipid Panel    Component Value Date/Time   CHOL 169 03/12/2015 0840   TRIG 49 03/12/2015 0840   HDL 77 03/12/2015 0840   CHOLHDL 2.2 03/12/2015 0840   VLDL 10 03/12/2015 0840   LDLCALC 82 03/12/2015 0840         Physical Exam:    VS:  BP (!) 130/90   Pulse 69   Ht 5\' 8"   (1.727 m)   Wt 159 lb 9.6 oz (72.4 kg)   SpO2 99%   BMI 24.27 kg/m     Wt Readings from Last 3 Encounters:  05/14/23 159 lb 9.6 oz (72.4 kg)  01/13/23 163 lb (73.9 kg)  12/16/22 163 lb (73.9 kg)     GEN:  Well nourished, well developed in no acute distress HEENT: Normal NECK: No JVD; No carotid bruits LYMPHATICS: No lymphadenopathy CARDIAC: RRR, no murmurs, rubs, gallops RESPIRATORY:  Clear to auscultation without rales, wheezing or rhonchi  ABDOMEN: Soft, non-tender, non-distended MUSCULOSKELETAL:  No edema; No deformity  SKIN: Warm and dry NEUROLOGIC:  Alert and oriented x 3 PSYCHIATRIC:  Normal affect   Assessment & Plan Coronary artery disease involving native coronary artery of native heart without angina pectoris Appears to be stable without angina.  Manage with clopidogrel and rosuvastatin.  See below for changes. Essential hypertension Blood pressure controlled on metoprolol succinate.  Labs reviewed with creatinine 0.86 and potassium 4.2.  She notes whitecoat hypertension and some issues recently with anxiety.  Seems appropriate to keep [name] on metoprolol succinate at the current dose. Hyperlipidemia LDL goal <70 Treated with rosuvastatin 20 mg daily.  Cholesterol 157, triglycerides 64, HDL 69, LDL 75.  She feels like [name] joint pains have been worse since she has been on the rosuvastatin 20 mg dose which was increased last year.  Will decrease [name] dose to 10 mg and add Zetia 10 mg daily.  Check lipids and LFTs in 3 months. Mitral valve insufficiency, unspecified etiology Mild on last echo assessment.  No audible murmur on exam. PAC (premature atrial contraction) Frequent PACs noted on outpatient monitoring, no sustained arrhythmias, continue metoprolol succinate.  Appears clinically stable.     Medication Adjustments/Labs and Tests Ordered: Current medicines are reviewed at length with the patient today.  Concerns regarding medicines are outlined above.  Orders Placed  This Encounter  Procedures   Lipid panel   Hepatic function panel   Meds ordered this encounter  Medications   rosuvastatin (CRESTOR) 10 MG tablet    Sig: Take 1 tablet (10 mg total) by mouth daily.    Dispense:  90 tablet    Refill:  3    Dose DECREASE   ezetimibe (ZETIA) 10 MG tablet    Sig: Take 1 tablet (10 mg total) by mouth daily.    Dispense:  90 tablet    Refill:  3    Patient Instructions  Medication Instructions:  DECREASE Crestor/Rosuvastatin to 10mg  daily START Zetia/Ezetimibe 10mg  daily *If you need a refill on your cardiac medications before your next appointment, please call your pharmacy*  Lab Work: Lipids, Liver in 3-4 months If you have labs (blood work) drawn today and your tests are completely normal,  you will receive your results only by: MyChart Message (if you have MyChart) OR A paper copy in the mail If you have any lab test that is abnormal or we need to change your treatment, we will call you to review the results.  Follow-Up: At [facility], you and your health needs are our priority.  As part of our continuing mission to provide you with exceptional heart care, we have created designated Provider Care Teams.  These Care Teams include your primary Cardiologist (physician) and Advanced Practice Providers (APPs -  Physician Assistants and Nurse Practitioners) who all work together to provide you with the care you need, when you need it.  Your next appointment:   1 year(s)  Provider:   Tonny Bollman, MD     1st Floor: - Lobby - Registration  - Pharmacy  - Lab - Cafe  2nd Floor: - PV Lab - Diagnostic Testing (echo, CT, nuclear med)  3rd Floor: - Vacant  4th Floor: - TCTS (cardiothoracic surgery) - AFib Clinic - Structural Heart Clinic - Vascular Surgery  - Vascular Ultrasound  5th Floor: - HeartCare Cardiology (general and EP) - Clinical Pharmacy for coumadin, hypertension, lipid, weight-loss medications, and med  management appointments    Valet parking services will be available as well.          Signed, Tonny Bollman, MD  05/14/2023 4:47 PM    [facility]

## 2023-05-14 NOTE — Assessment & Plan Note (Addendum)
Mild on last echo assessment.  No audible murmur on exam.

## 2023-05-14 NOTE — Assessment & Plan Note (Addendum)
Frequent PACs noted on outpatient monitoring, no sustained arrhythmias, continue metoprolol succinate.  Appears clinically stable.

## 2023-06-03 ENCOUNTER — Telehealth: Payer: Self-pay | Admitting: Cardiovascular Disease

## 2023-06-03 NOTE — Telephone Encounter (Signed)
 Left voicemail to return call to office

## 2023-06-03 NOTE — Telephone Encounter (Signed)
 Pt c/o medication issue:  1. Name of Medication: ezetimibe  (ZETIA ) 10 MG tablet   2. How are you currently taking this medication (dosage and times per day)? Not currently taking   3. Are you having a reaction (difficulty breathing--STAT)? Yes   4. What is your medication issue? Patient states her joint and muscle pain worsened while on this medication. Due to this she has stopped taking the medication for the past 3 days and had noticed an improvement. Please advise.

## 2023-06-03 NOTE — Telephone Encounter (Signed)
Since she has been stable with respect to her CAD. I probably would just continue on the low dose of crestor and stay off of the Zetia. Thanks

## 2023-06-03 NOTE — Telephone Encounter (Signed)
Spoke with patient and she states since she started taking zetia her joint pain increased. She has stopped taking the Zetia for 3 days and states her joint pain decreased. She would like to know if you suggest another medication.

## 2023-06-04 NOTE — Telephone Encounter (Signed)
 Spoke with patient and she will stop Zetia  and continue her low dose of crestor . She verbalized understanding.

## 2023-06-04 NOTE — Telephone Encounter (Signed)
 Pt returning call to a nurse

## 2023-06-19 ENCOUNTER — Other Ambulatory Visit: Payer: Self-pay

## 2023-06-19 DIAGNOSIS — N952 Postmenopausal atrophic vaginitis: Secondary | ICD-10-CM

## 2023-06-19 NOTE — Telephone Encounter (Signed)
 Med refill request: Estradiol Comp Rx Last AEX: 07/04/2021-ML, has been seen for OV's since, last OV was for a problem on 01/13/2023 Next AEX: nothing currently Last MMG (if hormonal med): 04/23/2022-WNL Refill authorized: rx pend.

## 2023-06-23 NOTE — Telephone Encounter (Signed)
 Per [place], pt had her mammo done in 04/2023. Will be sending report over now.  Msg sent to appt desk to schedule B&P.   Routing to provider for final review and authorization of rx refill.

## 2023-06-23 NOTE — Telephone Encounter (Signed)
 Pt now scheduled for 06.23.2025 for B&P.

## 2023-06-24 MED ORDER — ESTRADIOL POWD: 0 refills | Status: DC

## 2023-06-24 NOTE — Telephone Encounter (Signed)
 Rx successfully faxed to [facility].

## 2023-06-25 ENCOUNTER — Encounter: Payer: Self-pay | Admitting: Obstetrics and Gynecology

## 2023-06-29 ENCOUNTER — Encounter: Payer: Self-pay | Admitting: Obstetrics and Gynecology

## 2023-07-01 ENCOUNTER — Encounter: Payer: Self-pay | Admitting: Obstetrics and Gynecology

## 2023-08-12 ENCOUNTER — Ambulatory Visit
Admission: RE | Admit: 2023-08-12 | Discharge: 2023-08-12 | Disposition: A | Discharge status: Home / Self Care | Source: Ambulatory Visit | Attending: Nurse Practitioner | Admitting: Nurse Practitioner

## 2023-08-12 ENCOUNTER — Other Ambulatory Visit: Payer: Self-pay | Admitting: Nurse Practitioner

## 2023-08-12 DIAGNOSIS — K5900 Constipation, unspecified: Secondary | ICD-10-CM

## 2023-10-05 ENCOUNTER — Telehealth: Payer: Self-pay | Admitting: Cardiovascular Disease

## 2023-10-05 MED ORDER — CLOPIDOGREL BISULFATE 75 MG PO TABS: 75.0000 mg | ORAL_TABLET | Freq: Every day | ORAL | 2 refills | Status: AC

## 2023-10-05 NOTE — Telephone Encounter (Signed)
*  STAT* If patient is at the pharmacy, call can be transferred to refill team.   1. Which medications need to be refilled? (please list name of each medication and dose if known)   clopidogrel  (PLAVIX ) 75 MG tablet   2. Which pharmacy/location (including street and city if local pharmacy) is medication to be sent to? [facility] - [place], [place] - [address]   3. Do they need a 30 day or 90 day supply? 90

## 2023-10-05 NOTE — Telephone Encounter (Signed)
 RX sent to requested Pharmacy

## 2023-10-19 ENCOUNTER — Ambulatory Visit: Payer: PPO | Admitting: Obstetrics and Gynecology

## 2023-10-19 ENCOUNTER — Encounter: Payer: Self-pay | Admitting: Obstetrics and Gynecology

## 2023-10-19 ENCOUNTER — Telehealth: Payer: Self-pay | Admitting: Obstetrics and Gynecology

## 2023-10-19 VITALS — BP 116/78 | HR 65 | Ht 68.5 in | Wt 165.0 lb

## 2023-10-19 DIAGNOSIS — Z5181 Encounter for therapeutic drug level monitoring: Secondary | ICD-10-CM | POA: Diagnosis not present

## 2023-10-19 DIAGNOSIS — Z9189 Other specified personal risk factors, not elsewhere classified: Secondary | ICD-10-CM

## 2023-10-19 DIAGNOSIS — M8589 Other specified disorders of bone density and structure, multiple sites: Secondary | ICD-10-CM | POA: Diagnosis not present

## 2023-10-19 DIAGNOSIS — Z01419 Encounter for gynecological examination (general) (routine) without abnormal findings: Secondary | ICD-10-CM | POA: Diagnosis not present

## 2023-10-19 DIAGNOSIS — N952 Postmenopausal atrophic vaginitis: Secondary | ICD-10-CM

## 2023-10-19 MED ORDER — ESTRADIOL POWD: 3 refills | Status: AC

## 2023-10-19 NOTE — Progress Notes (Signed)
 83 y.o. G75P2002 Widowed Caucasian female here for a breast and pelvic exam.    The patient is also followed for vaginal atrophy and use of compounded vaginal estradiol  cream, which she is using once weekly.  No more pelvic discomfort.   She has a history of osteopenia. Has had multiple falls and no fractures. No upcoming dental work.   PCP: Valma Carwin, MD  Cardiology:  Dr. Wonda  No LMP recorded. Patient has had a hysterectomy.           Sexually active: No.  The current method of family planning is post menopausal status.    Menopausal hormone therapy:  Estradiol  cream Exercising: No.   Smoker:  no  OB History     Gravida  2   Para  2   Term  2   Preterm      AB      Living  2      SAB      IAB      Ectopic      Multiple      Live Births              HEALTH MAINTENANCE: Last 2 paps: 2011 History of abnormal Pap or positive HPV:  no Mammogram:  04/30/23 Breast Density Cat B, BIRADS Cat 1 neg  Colonoscopy:  2013 Bone Density:  04/30/23  Result  osteopenia of hips and wrist. FRAX 20%/5.9%.  Took Fosamax many years ago for over 5 years.   Immunization History  Administered Date(s) Administered   PFIZER(Purple Top)SARS-COV-2 Vaccination 05/18/2019, 06/08/2019      reports that she has never smoked. She has never used smokeless tobacco. She reports that she does not drink alcohol and does not use drugs.  Past Medical History:  Diagnosis Date   Hyperlipidemia    Hypertension    Ischemic heart disease    Myocardial infarction (HCC) 2003   Osteopenia 01/2019   T score -1.1 stable from prior DEXA    Past Surgical History:  Procedure Laterality Date   ABDOMINAL HYSTERECTOMY  1984   TAH bleeding adenomyosis   BREAST SURGERY     Breast bx   CATARACT EXTRACTION W/ INTRAOCULAR LENS IMPLANT Bilateral 11/2017   CHOLECYSTECTOMY N/A 11/29/2018   Procedure: LAPAROSCOPIC CHOLECYSTECTOMY WITH INTRAOPERATIVE CHOLANGIOGRAM;  Surgeon: Gladis Cough, MD;   Location: [facility];  Service: General;  Laterality: N/A;    Current Outpatient Medications  Medication Sig Dispense Refill   Ascorbic Acid (VITA-C PO) Take 1,000 mg by mouth in the morning and at bedtime.     BELSOMRA 20 MG TABS Take 1 tablet by mouth at bedtime as needed. Per patient taking 1/4 mg of 20mg  tablet.     Calcium  Citrate-Vitamin D (CALCIUM  + D PO) Take by mouth.     Cholecalciferol (VITAMIN D) 1000 UNITS capsule Take 1,000 Units by mouth daily.     clopidogrel  (PLAVIX ) 75 MG tablet Take 1 tablet (75 mg total) by mouth daily. 90 tablet 2   Coenzyme Q10 (COQ10) 100 MG CAPS Take 100 mg by mouth daily.     Estradiol  POWD ESTRADIOL  (ELLAGE) ( ) 0.02% (0.2MG /ML) CREAM 0.02% (0.2MG /ML) CREAM Insert 1mL vaginally twice weekly. Quantity: 24 doses/prefilled applicators of 1mL Refills: 0 24 g 0   ezetimibe  (ZETIA ) 10 MG tablet Take 1 tablet (10 mg total) by mouth daily. 90 tablet 3   Methylcellulose, Laxative, (CITRUCEL PO) Take 1 packet by mouth at bedtime.     metoprolol  succinate (TOPROL -XL)  50 MG 24 hr tablet Take 1 tablet (50 mg total) by mouth daily. Take with or immediately following a meal. 90 tablet 3   nitroGLYCERIN  (NITROSTAT ) 0.4 MG SL tablet Place 1 tablet (0.4 mg total) under the tongue every 5 (five) minutes as needed for chest pain. 25 tablet 3   Omega-3 Fatty Acids (FISH OIL) 1200 MG CAPS Take 1,200 mg by mouth daily.     rosuvastatin  (CRESTOR ) 10 MG tablet Take 1 tablet (10 mg total) by mouth daily. 90 tablet 3   traMADol  (ULTRAM ) 50 MG tablet Take 50 mg by mouth 3 (three) times daily as needed.     vitamin B-12 (CYANOCOBALAMIN) 500 MCG tablet Take 500 mcg by mouth once a week.     No current facility-administered medications for this visit.    ALLERGIES: Codeine, Lipitor [atorvastatin calcium ], and Simvastatin  Family History  Problem Relation Age of Onset   Hypertension Mother    Heart disease Mother    Osteoporosis Mother    Heart disease Father    Colon  cancer Father    Breast cancer Maternal Grandmother        Age unknown   Cancer Paternal Grandmother        Unknown type    Review of Systems  All other systems reviewed and are negative.   PHYSICAL EXAM:  BP 116/78 (BP Location: Left Arm, Patient Position: Sitting)   Pulse 65   Ht 5' 8.5 (1.74 m)   Wt 165 lb (74.8 kg)   SpO2 96%   BMI 24.72 kg/m     General appearance: alert, cooperative and appears stated age Head: normocephalic, without obvious abnormality, atraumatic Neck: no adenopathy, supple, symmetrical, trachea midline and thyroid  normal to inspection and palpation Lungs: clear to auscultation bilaterally Breasts: normal appearance, no masses or tenderness, No nipple retraction or dimpling, No nipple discharge or bleeding, No axillary adenopathy Heart: regular rate and rhythm Abdomen: soft, non-tender; no masses, no organomegaly Extremities: extremities normal, atraumatic, no cyanosis or edema Skin: skin color, texture, turgor normal. No rashes or lesions Lymph nodes: cervical, supraclavicular, and axillary nodes normal. Neurologic: grossly normal  Pelvic: External genitalia:  no lesions              No abnormal inguinal nodes palpated.              Urethra:  normal appearing urethra with no masses, tenderness or lesions              Bartholins and Skenes: normal                 Vagina: normal appearing vagina with normal color and discharge, no lesions              Cervix:  absent              Pap taken: no Bimanual Exam:  Uterus:  absent              Adnexa: no mass, fullness, tenderness              Rectal exam: yes.  Confirms.              Anus:  normal sphincter tone, no lesions  Chaperone was present for exam:  Kari HERO, CMA  ASSESSMENT: Encounter for breast and pelvic exam.  Status post hysterectomy. Ovaries remain.  Vaginal atrophy.  Encounter for medication monitoring.  Osteopenia.  At increased risk of hip fracture.  Hx MI.  PLAN: Mammogram  screening discussed. Self breast awareness reviewed. Pap and HRV collected:  no.  Not indicated. Guidelines for Calcium , Vitamin D, regular exercise program including cardiovascular and weight bearing exercise. Medication refills:  Compounded estradiol  cream 0.02%, 1 gram pv at hs twice weekly. Can be only once weekly if desired.  Labs with PCP.  Will precert Prolia.  BMD in Jan. 2027.  Follow up:  yearly and prn.    Additional counseling given.  yes. 30 min  total time was spent for this patient encounter, including preparation, face-to-face counseling with the patient, coordination of care, and documentation of the encounter in addition to doing the breast and pelvic exam.

## 2023-10-19 NOTE — Telephone Encounter (Signed)
 Please start approval process for Prolia.  My patient has osteopenia of hips and forearm.  She has increased risk of fracture by FRAX.  She has done Fosamax treatment many years ago.

## 2023-10-19 NOTE — Patient Instructions (Addendum)
Denosumab Injection (Osteoporosis) What is this medication? DENOSUMAB (den oh SUE mab) prevents and treats osteoporosis. It works by Interior and spatial designer stronger and less likely to break (fracture). It is a monoclonal antibody. This medicine may be used for other purposes; ask your health care provider or pharmacist if you have questions. COMMON BRAND NAME(S): Prolia What should I tell my care team before I take this medication? They need to know if you have any of these conditions: Dental or gum disease Had thyroid or parathyroid (glands located in neck) surgery Having dental surgery or a tooth pulled Kidney disease Low levels of calcium in the blood On dialysis Poor nutrition Thyroid disease Trouble absorbing nutrients from your food An unusual or allergic reaction to denosumab, other medications, foods, dyes, or preservatives Pregnant or trying to get pregnant Breastfeeding How should I use this medication? This medication is injected under the skin. It is given by your care team in a hospital or clinic setting. A special MedGuide will be given to you before each treatment. Be sure to read this information carefully each time. Talk to your care team about the use of this medication in children. Special care may be needed. Overdosage: If you think you have taken too much of this medicine contact a poison control center or emergency room at once. NOTE: This medicine is only for you. Do not share this medicine with others. What if I miss a dose? Keep appointments for follow-up doses. It is important not to miss your dose. Call your care team if you are unable to keep an appointment. What may interact with this medication? Do not take this medication with any of the following: Other medications that contain denosumab This medication may also interact with the following: Medications that lower your chance of fighting infection Steroid medications, such as prednisone or cortisone This  list may not describe all possible interactions. Give your health care provider a list of all the medicines, herbs, non-prescription drugs, or dietary supplements you use. Also tell them if you smoke, drink alcohol, or use illegal drugs. Some items may interact with your medicine. What should I watch for while using this medication? Your condition will be monitored carefully while you are receiving this medication. You may need blood work done while taking this medication. This medication may increase your risk of getting an infection. Call your care team for advice if you get a fever, chills, sore throat, or other symptoms of a cold or flu. Do not treat yourself. Try to avoid being around people who are sick. Tell your dentist and dental surgeon that you are taking this medication. You should not have major dental surgery while on this medication. See your dentist to have a dental exam and fix any dental problems before starting this medication. Take good care of your teeth while on this medication. Make sure you see your dentist for regular follow-up appointments. This medication may cause low levels of calcium in your body. The risk of severe side effects is increased in people with kidney disease. Your care team may prescribe calcium and vitamin D to help prevent low calcium levels while you take this medication. It is important to take calcium and vitamin D as directed by your care team. Talk to your care team if you may be pregnant. Serious birth defects may occur if you take this medication during pregnancy and for 5 months after the last dose. You will need a negative pregnancy test before starting this medication. Contraception  is recommended while taking this medication and for 5 months after the last dose. Your care team can help you find the option that works for you. Talk to your care team before breastfeeding. Changes to your treatment plan may be needed. What side effects may I notice from  receiving this medication? Side effects that you should report to your care team as soon as possible: Allergic reactions--skin rash, itching, hives, swelling of the face, lips, tongue, or throat Infection--fever, chills, cough, sore throat, wounds that don't heal, pain or trouble when passing urine, general feeling of discomfort or being unwell Low calcium level--muscle pain or cramps, confusion, tingling, or numbness in the hands or feet Osteonecrosis of the jaw--pain, swelling, or redness in the mouth, numbness of the jaw, poor healing after dental work, unusual discharge from the mouth, visible bones in the mouth Severe bone, joint, or muscle pain Skin infection--skin redness, swelling, warmth, or pain Side effects that usually do not require medical attention (report these to your care team if they continue or are bothersome): Back pain Headache Joint pain Muscle pain Pain in the hands, arms, legs, or feet Runny or stuffy nose Sore throat This list may not describe all possible side effects. Call your doctor for medical advice about side effects. You may report side effects to FDA at 1-800-FDA-1088. Where should I keep my medication? This medication is given in a hospital or clinic. It will not be stored at home. NOTE: This sheet is a summary. It may not cover all possible information. If you have questions about this medicine, talk to your doctor, pharmacist, or health care provider.  2024 Elsevier/Gold Standard (2022-05-20 00:00:00)   EXERCISE AND DIET:  We recommended that you start or continue a regular exercise program for good health. Regular exercise means any activity that makes your heart beat faster and makes you sweat.  We recommend exercising at least 30 minutes per day at least 3 days a week, preferably 4 or 5.  We also recommend a diet low in fat and sugar.  Inactivity, poor dietary choices and obesity can cause diabetes, heart attack, stroke, and kidney damage, among  others.    ALCOHOL AND SMOKING:  Women should limit their alcohol intake to no more than 7 drinks/beers/glasses of wine (combined, not each!) per week. Moderation of alcohol intake to this level decreases your risk of breast cancer and liver damage. And of course, no recreational drugs are part of a healthy lifestyle.  And absolutely no smoking or even second hand smoke. Most people know smoking can cause heart and lung diseases, but did you know it also contributes to weakening of your bones? Aging of your skin?  Yellowing of your teeth and nails?  CALCIUM AND VITAMIN D:  Adequate intake of calcium and Vitamin D are recommended.  The recommendations for exact amounts of these supplements seem to change often, but generally speaking 600 mg of calcium (either carbonate or citrate) and 800 units of Vitamin D per day seems prudent. Certain women may benefit from higher intake of Vitamin D.  If you are among these women, your doctor will have told you during your visit.    PAP SMEARS:  Pap smears, to check for cervical cancer or precancers,  have traditionally been done yearly, although recent scientific advances have shown that most women can have pap smears less often.  However, every woman still should have a physical exam from her gynecologist every year. It will include a breast check, inspection  of the vulva and vagina to check for abnormal growths or skin changes, a visual exam of the cervix, and then an exam to evaluate the size and shape of the uterus and ovaries.  And after 85 years of age, a rectal exam is indicated to check for rectal cancers. We will also provide age appropriate advice regarding health maintenance, like when you should have certain vaccines, screening for sexually transmitted diseases, bone density testing, colonoscopy, mammograms, etc.   MAMMOGRAMS:  All women over 80 years old should have a yearly mammogram. Many facilities now offer a "3D" mammogram, which may cost around $50  extra out of pocket. If possible,  we recommend you accept the option to have the 3D mammogram performed.  It both reduces the number of women who will be called back for extra views which then turn out to be normal, and it is better than the routine mammogram at detecting truly abnormal areas.    COLONOSCOPY:  Colonoscopy to screen for colon cancer is recommended for all women at age 47.  We know, you hate the idea of the prep.  We agree, BUT, having colon cancer and not knowing it is worse!!  Colon cancer so often starts as a polyp that can be seen and removed at colonscopy, which can quite literally save your life!  And if your first colonoscopy is normal and you have no family history of colon cancer, most women don't have to have it again for 10 years.  Once every ten years, you can do something that may end up saving your life, right?  We will be happy to help you get it scheduled when you are ready.  Be sure to check your insurance coverage so you understand how much it will cost.  It may be covered as a preventative service at no cost, but you should check your particular policy.

## 2023-10-22 ENCOUNTER — Telehealth: Payer: Self-pay | Admitting: *Deleted

## 2023-10-22 NOTE — Telephone Encounter (Signed)
 Spoke with patient.   OV scheduled for 1 year med check with Dr. Nikki 10/24/24 at 2:00 PM.   Patient asking if B&P exam needed for 2026 since no pap in 7 years. Advised per review of last B&P 10/19/23 no pap indicated.   Hx of hysterectomy, last pap 2011.   Recommended patient keep OV as scheduled. Patient provided OOP cost for B&P exam if she chooses vs OV.   Routing to provider for final review. Patient is agreeable to disposition. Will close encounter.

## 2023-10-28 NOTE — Telephone Encounter (Signed)
Insurance information submitted to [employer] portal. Will await summary of benefits for prolia.    

## 2023-10-30 ENCOUNTER — Other Ambulatory Visit: Payer: Self-pay | Admitting: Cardiology

## 2023-11-03 ENCOUNTER — Other Ambulatory Visit: Payer: Self-pay | Admitting: *Deleted

## 2023-11-03 MED ORDER — DENOSUMAB 60 MG/ML ~~LOC~~ SOSY: 60.0000 mg | PREFILLED_SYRINGE | Freq: Once | SUBCUTANEOUS | Status: DC

## 2023-11-06 NOTE — Telephone Encounter (Signed)
 Patient called triage line & stated she was returning a call to [place].

## 2023-11-09 ENCOUNTER — Telehealth: Payer: Self-pay | Admitting: Cardiovascular Disease

## 2023-11-09 NOTE — Telephone Encounter (Signed)
 RX sent in on 11/02/23

## 2023-11-09 NOTE — Telephone Encounter (Signed)
*  STAT* If patient is at the pharmacy, call can be transferred to refill team.   1. Which medications need to be refilled? (please list name of each medication and dose if known) Metoprolol    2. Would you like to learn more about the convenience, safety, & potential cost savings by using the [facility]?      3. Are you open to using the [facility] (Type [facility]. ).   4. Which pharmacy/location (including street and city if local pharmacy) is medication to be sent to? [facility]  5. Do they need a 30 day or 90 day supply? 90 days and refills- please call today- out of medicine

## 2023-11-10 NOTE — Telephone Encounter (Signed)
Thank you for scheduling the appointment.   

## 2023-11-10 NOTE — Telephone Encounter (Signed)
 Call returned to patient. Patient is reuqesting consult with Dr. Nikki to further discuss Prolia . Patient is concerned about side effects.   OV scheduled for 11/11/23 at 1130.   Advised patient Dr. Silva will provide update to [facility] after visit is completed. Damien will f/u with benefits if she desires to proceed.   Confirmed insurance on file.  No upcoming dental procedures.  Calcium  9.9 on 05/19/23 -[facility]  Routing FYI  Cc: Damien

## 2023-11-11 ENCOUNTER — Ambulatory Visit (INDEPENDENT_AMBULATORY_CARE_PROVIDER_SITE_OTHER): Admitting: Obstetrics and Gynecology

## 2023-11-11 ENCOUNTER — Encounter: Payer: Self-pay | Admitting: Obstetrics and Gynecology

## 2023-11-11 VITALS — BP 126/84 | HR 64

## 2023-11-11 DIAGNOSIS — M8589 Other specified disorders of bone density and structure, multiple sites: Secondary | ICD-10-CM

## 2023-11-11 DIAGNOSIS — Z9189 Other specified personal risk factors, not elsewhere classified: Secondary | ICD-10-CM

## 2023-11-11 MED ORDER — ALENDRONATE SODIUM 70 MG PO TABS: 70.0000 mg | ORAL_TABLET | ORAL | 0 refills | Status: DC

## 2023-11-11 NOTE — Patient Instructions (Signed)
Alendronate Tablets What is this medication? ALENDRONATE (a LEN droe nate) prevents and treats osteoporosis. It may also be used to treat Paget disease of the bone. It works by Interior and spatial designer stronger and less likely to break (fracture). It belongs to a group of medications called bisphosphonates. This medicine may be used for other purposes; ask your health care provider or pharmacist if you have questions. COMMON BRAND NAME(S): Fosamax What should I tell my care team before I take this medication? They need to know if you have any of these conditions: Bleeding disorder Cancer Dental disease Difficulty swallowing Infection (fever, chills, cough, sore throat, pain or trouble passing urine) Kidney disease Low levels of calcium or other minerals in the blood Low red blood cell counts Receiving steroids like dexamethasone or prednisone Stomach or intestine problems Trouble sitting or standing for 30 minutes An unusual or allergic reaction to alendronate, other medications, foods, dyes or preservatives Pregnant or trying to get pregnant Breast-feeding How should I use this medication? Take this medication by mouth with a full glass of water. Take it as directed on the prescription label at the same time every day. Take the dose right after waking up. Do not eat or drink anything before taking it. Do not take it with any other drink except water. Do not chew or crush the tablet. After taking it, do not eat breakfast, drink, or take any other medications or vitamins for at least 30 minutes. Sit or stand up for at least 30 minutes after you take it. Do not lie down. Keep taking it unless your care team tells you to stop. A special MedGuide will be given to you by the pharmacist with each prescription and refill. Be sure to read this information carefully each time. Talk to your care team about the use of this medication in children. Special care may be needed. Overdosage: If you think you have  taken too much of this medicine contact a poison control center or emergency room at once. NOTE: This medicine is only for you. Do not share this medicine with others. What if I miss a dose? If you take your medication once a day, skip it. Take your next dose at the scheduled time the next morning. Do not take two doses on the same day. If you take your medication once a week, take the missed dose on the morning after you remember. Do not take two doses on the same day. What may interact with this medication? Aluminum hydroxide Antacids Aspirin Calcium supplements Medications for inflammation like ibuprofen, naproxen, and others Iron supplements Magnesium supplements Vitamins with minerals This list may not describe all possible interactions. Give your health care provider a list of all the medicines, herbs, non-prescription drugs, or dietary supplements you use. Also tell them if you smoke, drink alcohol, or use illegal drugs. Some items may interact with your medicine. What should I watch for while using this medication? Visit your care team for regular checks on your progress. It may be some time before you see the benefit from this medication. Some people who take this medication have severe bone, joint, or muscle pain. This medication may also increase your risk for jaw problems or a broken thigh bone. Tell your care team right away if you have severe pain in your jaw, bones, joints, or muscles. Tell you care team if you have any pain that does not go away or that gets worse. Tell your dentist and dental surgeon that you are  taking this medication. You should not have major dental surgery while on this medication. See your dentist to have a dental exam and fix any dental problems before starting this medication. Take good care of your teeth while on this medication. Make sure you see your dentist for regular follow-up appointments. You should make sure you get enough calcium and vitamin D  while you are taking this medication. Discuss the foods you eat and the vitamins you take with your care team. You may need blood work done while you are taking this medication. What side effects may I notice from receiving this medication? Side effects that you should report to your care team as soon as possible: Allergic reactions--skin rash, itching, hives, swelling of the face, lips, tongue, or throat Low calcium level--muscle pain or cramps, confusion, tingling, or numbness in the hands or feet Osteonecrosis of the jaw--pain, swelling, or redness in the mouth, numbness of the jaw, poor healing after dental work, unusual discharge from the mouth, visible bones in the mouth Pain or trouble swallowing Severe bone, joint, or muscle pain Stomach bleeding--bloody or black, tar-like stools, vomiting blood or brown material that looks like coffee grounds Side effects that usually do not require medical attention (report to your care team if they continue or are bothersome): Constipation Diarrhea Nausea Stomach pain This list may not describe all possible side effects. Call your doctor for medical advice about side effects. You may report side effects to FDA at 1-800-FDA-1088. Where should I keep my medication? Keep out of the reach of children and pets. Store at room temperature between 15 and 30 degrees C (59 and 86 degrees F). Throw away any unused medication after the expiration date. NOTE: This sheet is a summary. It may not cover all possible information. If you have questions about this medicine, talk to your doctor, pharmacist, or health care provider.  2024 Elsevier/Gold Standard (2020-04-26 00:00:00)

## 2023-11-11 NOTE — Progress Notes (Signed)
 GYNECOLOGY  VISIT   HPI: 83 y.o.   Widowed  Caucasian female   [name] with No LMP recorded. Patient has had a hysterectomy.   here for: Prola Consultation    04/30/23  Result  osteopenia of hips and wrist. FRAX 20%/5.9%.     Patient is very concerned about potential muscle, bone, joint pain, risk of infection with taking Prolia .  She is also concerned about fractures.  She has joint pain and degenerative change in her neck.   Took Fosamax  for about 6 - 7 years per patient.  She appears to have stopped Fosamax  in 2012 on chart review.  Not able to confirm dates of taking this. No problems when she took Fosamax .   GYNECOLOGIC HISTORY: No LMP recorded. Patient has had a hysterectomy. Contraception:  PMP Menopausal hormone therapy:  estradiol   Last 2 paps:  2011 History of abnormal Pap or positive HPV:  no Mammogram:   04/30/23 Breast Density Cat B, BIRADS Cat 1 neg         OB History     Gravida  2   Para  2   Term  2   Preterm      AB      Living  2      SAB      IAB      Ectopic      Multiple      Live Births                 Patient Active Problem List   Diagnosis Date Noted   PAC (premature atrial contraction) 10/08/2022   Encounter for preoperative examination for general surgical procedure 10/08/2022   Mitral regurgitation 10/08/2022   S/P laparoscopic cholecystectomyAugust2020 11/29/2018   Benign hypertensive heart disease without heart failure 03/01/2013   Vaginal atrophy 05/03/2012   Myocardial infarction (HCC)    Hyperlipidemia    Ischemic heart disease    Senile osteoporosis 02/27/2011   Breast cyst 02/27/2011   Hyperkalemia 01/28/2011   Old MI (myocardial infarction) 08/08/2010   Hypercholesterolemia 08/08/2010   Osteoporosis 08/08/2010    Past Medical History:  Diagnosis Date   Hyperlipidemia    Hypertension    Ischemic heart disease    Myocardial infarction (HCC) 2003   Osteopenia 01/2019   T score -1.1 stable from prior  DEXA    Past Surgical History:  Procedure Laterality Date   ABDOMINAL HYSTERECTOMY  1984   TAH bleeding adenomyosis   BREAST SURGERY     Breast bx   CATARACT EXTRACTION W/ INTRAOCULAR LENS IMPLANT Bilateral 11/2017   CHOLECYSTECTOMY N/A 11/29/2018   Procedure: LAPAROSCOPIC CHOLECYSTECTOMY WITH INTRAOPERATIVE CHOLANGIOGRAM;  Surgeon: Gladis Cough, MD;  Location: [facility];  Service: General;  Laterality: N/A;    Current Outpatient Medications  Medication Sig Dispense Refill   alendronate  (FOSAMAX ) 70 MG tablet Take 1 tablet (70 mg total) by mouth every 7 (seven) days. Take with a full glass of water on an empty stomach. 12 tablet 0   Cholecalciferol (VITAMIN D) 1000 UNITS capsule Take 1,000 Units by mouth daily.     clopidogrel  (PLAVIX ) 75 MG tablet Take 1 tablet (75 mg total) by mouth daily. 90 tablet 2   Coenzyme Q10 (COQ10) 100 MG CAPS Take 100 mg by mouth daily.     Estradiol  POWD ESTRADIOL  (ELLAGE) ( ) 0.02% (0.2MG /ML) CREAM 0.02% (0.2MG /ML) CREAM Insert 1mL vaginally twice weekly. Quantity: 24 doses/prefilled applicators of 1mL Refills: 0  [facility] 24 g 3  Methylcellulose, Laxative, (CITRUCEL PO) Take 1 packet by mouth at bedtime.     metoprolol  succinate (TOPROL -XL) 50 MG 24 hr tablet TAKE ONE TABLET BY MOUTH ONCE DAILY WITH, OR IMMEDIATELY FOLLOWING A MEAL. 90 tablet 3   nitroGLYCERIN  (NITROSTAT ) 0.4 MG SL tablet Place 1 tablet (0.4 mg total) under the tongue every 5 (five) minutes as needed for chest pain. 25 tablet 3   Omega-3 Fatty Acids (FISH OIL) 1200 MG CAPS Take 1,200 mg by mouth daily.     rosuvastatin  (CRESTOR ) 10 MG tablet Take 1 tablet (10 mg total) by mouth daily. 90 tablet 3   vitamin B-12 (CYANOCOBALAMIN) 500 MCG tablet Take 500 mcg by mouth once a week.     Ascorbic Acid (VITA-C PO) Take 1,000 mg by mouth in the morning and at bedtime.     BELSOMRA 20 MG TABS Take 1 tablet by mouth at bedtime as needed. Per patient taking 1/4 mg of 20mg  tablet.      Calcium  Citrate-Vitamin D (CALCIUM  + D PO) Take by mouth.     ezetimibe  (ZETIA ) 10 MG tablet Take 1 tablet (10 mg total) by mouth daily. 90 tablet 3   traMADol  (ULTRAM ) 50 MG tablet Take 50 mg by mouth 3 (three) times daily as needed.     No current facility-administered medications for this visit.     ALLERGIES: Codeine, Lipitor [atorvastatin calcium ], and Simvastatin  Family History  Problem Relation Age of Onset   Hypertension Mother    Heart disease Mother    Osteoporosis Mother    Heart disease Father    Colon cancer Father    Breast cancer Maternal Grandmother        Age unknown   Cancer Paternal Grandmother        Unknown type    Social History   Socioeconomic History   Marital status: Widowed    Spouse name: Not on file   Number of children: Not on file   Years of education: Not on file   Highest education level: Not on file  Occupational History   Not on file  Tobacco Use   Smoking status: Never   Smokeless tobacco: Never  Vaping Use   Vaping status: Never Used  Substance and Sexual Activity   Alcohol use: No    Alcohol/week: 0.0 standard drinks of alcohol   Drug use: No   Sexual activity: Not Currently    Partners: Male    Birth control/protection: Surgical, Abstinence    Comment: 1st intercourse 83 yo-Fewer than 5 partners  Other Topics Concern   Not on file  Social History Narrative   Not on file   Social Drivers of Health   Financial Resource Strain: Not on file  Food Insecurity: Not on file  Transportation Needs: Not on file  Physical Activity: Not on file  Stress: Not on file  Social Connections: Not on file  Intimate Partner Violence: Not on file    Review of Systems  See HPI.  PHYSICAL EXAMINATION:   BP 126/84 (BP Location: Left Arm, Patient Position: Sitting)   Pulse 64   SpO2 97%     General appearance: alert, cooperative and appears stated age  ASSESSMENT:  Osteopenia with increased risk of fracture.  Hx Fosamax  use which was  well tolerated. Hx MI. Hx DJD and joint pain.   PLAN:  We did an in depth review of Prolia  risks and benefits through [employer] and I printed a summary for her.  We  also reviewed Fosamax  risks and benefits.  She understands that extended use of Fosamax  can result in atypical fractures.  She declines to Prolia  and will return to Fosamax  70 mg weekly.  #12, RF none.  Instructed in how to take the medication.  Next bone density in Jan. 2027.  Return in about 10 weeks.   35 min  total time was spent for this patient encounter, including preparation, face-to-face counseling with the patient, coordination of care, and documentation of the encounter.

## 2023-11-11 NOTE — Telephone Encounter (Signed)
 Patient declines Prolia .  I have discontinued this medication.

## 2023-11-12 ENCOUNTER — Ambulatory Visit: Payer: Self-pay | Admitting: Obstetrics and Gynecology

## 2023-11-12 LAB — BASIC METABOLIC PANEL WITH GFR
BUN: 17 mg/dL (ref 7–25)
CO2: 25 mmol/L (ref 20–32)
Calcium: 9.7 mg/dL (ref 8.6–10.4)
Chloride: 106 mmol/L (ref 98–110)
Creat: 0.82 mg/dL (ref 0.60–0.95)
Glucose, Bld: 90 mg/dL (ref 65–99)
Potassium: 4.8 mmol/L (ref 3.5–5.3)
Sodium: 141 mmol/L (ref 135–146)
eGFR: 70 mL/min/1.73m2 (ref >=60)

## 2023-11-12 NOTE — Telephone Encounter (Signed)
Routing FYI.   Encounter closed.

## 2024-01-25 ENCOUNTER — Encounter: Payer: Self-pay | Admitting: Obstetrics and Gynecology

## 2024-01-25 ENCOUNTER — Telehealth: Payer: Self-pay | Admitting: Obstetrics and Gynecology

## 2024-01-25 ENCOUNTER — Ambulatory Visit: Admitting: Obstetrics and Gynecology

## 2024-01-25 VITALS — BP 114/80 | HR 65 | Ht 68.5 in | Wt 164.0 lb

## 2024-01-25 DIAGNOSIS — Z9189 Other specified personal risk factors, not elsewhere classified: Secondary | ICD-10-CM

## 2024-01-25 DIAGNOSIS — M8589 Other specified disorders of bone density and structure, multiple sites: Secondary | ICD-10-CM

## 2024-01-25 NOTE — Telephone Encounter (Signed)
 Please precert Prolia , generic if needed.   My patient has osteopenia involving multiple locations, and she is intolerant to Fosamax .

## 2024-01-25 NOTE — Patient Instructions (Signed)
 Denosumab Injection (Osteoporosis) What is this medication? DENOSUMAB (den oh SUE mab) prevents and treats osteoporosis. It works by Interior and spatial designer stronger and less likely to break (fracture). It is a monoclonal antibody. This medicine may be used for other purposes; ask your health care provider or pharmacist if you have questions. COMMON BRAND NAME(S): Prolia What should I tell my care team before I take this medication? They need to know if you have any of these conditions: Dental or gum disease Had thyroid or parathyroid (glands located in neck) surgery Having dental surgery or a tooth pulled Kidney disease Low levels of calcium in the blood On dialysis Poor nutrition Thyroid disease Trouble absorbing nutrients from your food An unusual or allergic reaction to denosumab, other medications, foods, dyes, or preservatives Pregnant or trying to get pregnant Breastfeeding How should I use this medication? This medication is injected under the skin. It is given by your care team in a hospital or clinic setting. A special MedGuide will be given to you before each treatment. Be sure to read this information carefully each time. Talk to your care team about the use of this medication in children. Special care may be needed. Overdosage: If you think you have taken too much of this medicine contact a poison control center or emergency room at once. NOTE: This medicine is only for you. Do not share this medicine with others. What if I miss a dose? Keep appointments for follow-up doses. It is important not to miss your dose. Call your care team if you are unable to keep an appointment. What may interact with this medication? Do not take this medication with any of the following: Other medications that contain denosumab This medication may also interact with the following: Medications that lower your chance of fighting infection Steroid medications, such as prednisone or cortisone This  list may not describe all possible interactions. Give your health care provider a list of all the medicines, herbs, non-prescription drugs, or dietary supplements you use. Also tell them if you smoke, drink alcohol, or use illegal drugs. Some items may interact with your medicine. What should I watch for while using this medication? Your condition will be monitored carefully while you are receiving this medication. You may need blood work done while taking this medication. This medication may increase your risk of getting an infection. Call your care team for advice if you get a fever, chills, sore throat, or other symptoms of a cold or flu. Do not treat yourself. Try to avoid being around people who are sick. Tell your dentist and dental surgeon that you are taking this medication. You should not have major dental surgery while on this medication. See your dentist to have a dental exam and fix any dental problems before starting this medication. Take good care of your teeth while on this medication. Make sure you see your dentist for regular follow-up appointments. This medication may cause low levels of calcium in your body. The risk of severe side effects is increased in people with kidney disease. Your care team may prescribe calcium and vitamin D to help prevent low calcium levels while you take this medication. It is important to take calcium and vitamin D as directed by your care team. Talk to your care team if you may be pregnant. Serious birth defects may occur if you take this medication during pregnancy and for 5 months after the last dose. You will need a negative pregnancy test before starting this medication. Contraception  is recommended while taking this medication and for 5 months after the last dose. Your care team can help you find the option that works for you. Talk to your care team before breastfeeding. Changes to your treatment plan may be needed. What side effects may I notice from  receiving this medication? Side effects that you should report to your care team as soon as possible: Allergic reactions--skin rash, itching, hives, swelling of the face, lips, tongue, or throat Infection--fever, chills, cough, sore throat, wounds that don't heal, pain or trouble when passing urine, general feeling of discomfort or being unwell Low calcium level--muscle pain or cramps, confusion, tingling, or numbness in the hands or feet Osteonecrosis of the jaw--pain, swelling, or redness in the mouth, numbness of the jaw, poor healing after dental work, unusual discharge from the mouth, visible bones in the mouth Severe bone, joint, or muscle pain Skin infection--skin redness, swelling, warmth, or pain Side effects that usually do not require medical attention (report these to your care team if they continue or are bothersome): Back pain Headache Joint pain Muscle pain Pain in the hands, arms, legs, or feet Runny or stuffy nose Sore throat This list may not describe all possible side effects. Call your doctor for medical advice about side effects. You may report side effects to FDA at 1-800-FDA-1088. Where should I keep my medication? This medication is given in a hospital or clinic. It will not be stored at home. NOTE: This sheet is a summary. It may not cover all possible information. If you have questions about this medicine, talk to your doctor, pharmacist, or health care provider.  2024 Elsevier/Gold Standard (2022-05-20 00:00:00)

## 2024-01-25 NOTE — Progress Notes (Signed)
 GYNECOLOGY  VISIT   HPI: 83 y.o.   Widowed  Caucasian female   [name] with No LMP recorded. Patient has had a hysterectomy.   here for: Medication follow up- Fosamax . Has been experiencing headaches, nausea, constipation, joint pain, and stomach issues.     Has cortisone injections in her knees last week.   She will start physical therapy.   She had her bone density on 04/30/23 at [facility] and has osteopenia of hips and wrist and FRAX model:  20%/5.9%.  She took Fosamax  for 6 - 7 years in the the past without a problem.   She stopped in 2012 according to chart review.   She restarted Fosamax  after her visit on 11/11/23.  Taking Caltrate 600 mg + vit D twice daily and vit D 1000 International units daily.   Labs 11/11/23: Ca 9.7 Creat 0.82  Hx MI.    GYNECOLOGIC HISTORY: No LMP recorded. Patient has had a hysterectomy. Contraception:  PMP Menopausal hormone therapy:  Estradiol  powder Last 2 paps:  2011 History of abnormal Pap or positive HPV:  no Mammogram:  04/30/23 Breast Density Cat B, BIRADS Cat 1 neg         OB History     Gravida  2   Para  2   Term  2   Preterm      AB      Living  2      SAB      IAB      Ectopic      Multiple      Live Births                 Patient Active Problem List   Diagnosis Date Noted   PAC (premature atrial contraction) 10/08/2022   Encounter for preoperative examination for general surgical procedure 10/08/2022   Mitral regurgitation 10/08/2022   S/P laparoscopic cholecystectomyAugust2020 11/29/2018   Benign hypertensive heart disease without heart failure 03/01/2013   Vaginal atrophy 05/03/2012   Myocardial infarction (HCC)    Hyperlipidemia    Ischemic heart disease    Senile osteoporosis 02/27/2011   Breast cyst 02/27/2011   Hyperkalemia 01/28/2011   Old MI (myocardial infarction) 08/08/2010   Hypercholesterolemia 08/08/2010   Osteoporosis 08/08/2010    Past Medical History:  Diagnosis Date    Hyperlipidemia    Hypertension    Ischemic heart disease    Myocardial infarction (HCC) 2003   Osteopenia 01/2019   T score -1.1 stable from prior DEXA    Past Surgical History:  Procedure Laterality Date   ABDOMINAL HYSTERECTOMY  1984   TAH bleeding adenomyosis   BREAST SURGERY     Breast bx   CATARACT EXTRACTION W/ INTRAOCULAR LENS IMPLANT Bilateral 11/2017   CHOLECYSTECTOMY N/A 11/29/2018   Procedure: LAPAROSCOPIC CHOLECYSTECTOMY WITH INTRAOPERATIVE CHOLANGIOGRAM;  Surgeon: Gladis Cough, MD;  Location: [facility];  Service: General;  Laterality: N/A;    Current Outpatient Medications  Medication Sig Dispense Refill   alendronate  (FOSAMAX ) 70 MG tablet Take 1 tablet (70 mg total) by mouth every 7 (seven) days. Take with a full glass of water on an empty stomach. 12 tablet 0   Ascorbic Acid (VITA-C PO) Take 1,000 mg by mouth in the morning and at bedtime.     BELSOMRA 20 MG TABS Take 1 tablet by mouth at bedtime as needed. Per patient taking 1/4 mg of 20mg  tablet.     Calcium  Citrate-Vitamin D (CALCIUM  + D PO) Take by mouth.  Cholecalciferol (VITAMIN D) 1000 UNITS capsule Take 1,000 Units by mouth daily.     clopidogrel  (PLAVIX ) 75 MG tablet Take 1 tablet (75 mg total) by mouth daily. 90 tablet 2   Coenzyme Q10 (COQ10) 100 MG CAPS Take 100 mg by mouth daily.     Estradiol  POWD ESTRADIOL  (ELLAGE) ( ) 0.02% (0.2MG /ML) CREAM 0.02% (0.2MG /ML) CREAM Insert 1mL vaginally twice weekly. Quantity: 24 doses/prefilled applicators of 1mL Refills: 0  [facility] 24 g 3   ezetimibe  (ZETIA ) 10 MG tablet Take 1 tablet (10 mg total) by mouth daily. 90 tablet 3   HYDROcodone -acetaminophen  (NORCO/VICODIN) 5-325 MG tablet Oral     Methylcellulose, Laxative, (CITRUCEL PO) Take 1 packet by mouth at bedtime.     metoprolol  succinate (TOPROL -XL) 50 MG 24 hr tablet TAKE ONE TABLET BY MOUTH ONCE DAILY WITH, OR IMMEDIATELY FOLLOWING A MEAL. 90 tablet 3   nitroGLYCERIN  (NITROSTAT ) 0.4 MG SL  tablet Place 1 tablet (0.4 mg total) under the tongue every 5 (five) minutes as needed for chest pain. 25 tablet 3   Omega-3 Fatty Acids (FISH OIL) 1200 MG CAPS Take 1,200 mg by mouth daily.     rosuvastatin  (CRESTOR ) 10 MG tablet Take 1 tablet (10 mg total) by mouth daily. 90 tablet 3   traMADol  (ULTRAM ) 50 MG tablet Take 50 mg by mouth 3 (three) times daily as needed.     vitamin B-12 (CYANOCOBALAMIN) 500 MCG tablet Take 500 mcg by mouth once a week.     No current facility-administered medications for this visit.     ALLERGIES: Codeine, Lipitor [atorvastatin calcium ], and Simvastatin  Family History  Problem Relation Age of Onset   Hypertension Mother    Heart disease Mother    Osteoporosis Mother    Heart disease Father    Colon cancer Father    Breast cancer Maternal Grandmother        Age unknown   Cancer Paternal Grandmother        Unknown type    Social History   Socioeconomic History   Marital status: Widowed    Spouse name: Not on file   Number of children: Not on file   Years of education: Not on file   Highest education level: Not on file  Occupational History   Not on file  Tobacco Use   Smoking status: Never   Smokeless tobacco: Never  Vaping Use   Vaping status: Never Used  Substance and Sexual Activity   Alcohol use: No    Alcohol/week: 0.0 standard drinks of alcohol   Drug use: No   Sexual activity: Not Currently    Partners: Male    Birth control/protection: Surgical, Abstinence    Comment: 1st intercourse 83 yo-Fewer than 5 partners  Other Topics Concern   Not on file  Social History Narrative   Not on file   Social Drivers of Health   Financial Resource Strain: Not on file  Food Insecurity: Not on file  Transportation Needs: Not on file  Physical Activity: Not on file  Stress: Not on file  Social Connections: Not on file  Intimate Partner Violence: Not on file    Review of Systems  All other systems reviewed and are  negative.   PHYSICAL EXAMINATION:   BP 114/80 (BP Location: Left Arm, Patient Position: Sitting)   Pulse 65   Ht 5' 8.5 (1.74 m)   Wt 164 lb (74.4 kg)   SpO2 98%   BMI 24.57 kg/m  General appearance: alert, cooperative and appears stated age   ASSESSMENT:  Osteopenia of multiple sites.  Increased risk of hip fracture by FRAX.  Intolerant of Fosamax .  Hx MI.   PLAN:  Stop Fosamax .  Will precert Prolia .  Risks and benefits reviewed.  Next BMD Jan. 2027.  30 min  total time was spent for this patient encounter, including preparation, face-to-face counseling with the patient, coordination of care, and documentation of the encounter.

## 2024-02-22 NOTE — Telephone Encounter (Signed)
 This can be for generic Prolia .

## 2024-02-22 NOTE — Telephone Encounter (Signed)
 Please provide update of precert for Prolia .  Thank you.

## 2024-03-02 ENCOUNTER — Ambulatory Visit
Admission: EM | Admit: 2024-03-02 | Discharge: 2024-03-02 | Disposition: A | Discharge status: Home / Self Care | Attending: Family Medicine | Admitting: Family Medicine

## 2024-03-02 ENCOUNTER — Other Ambulatory Visit: Payer: Self-pay | Admitting: *Deleted

## 2024-03-02 ENCOUNTER — Encounter: Payer: Self-pay | Admitting: Emergency Medicine

## 2024-03-02 DIAGNOSIS — J3089 Other allergic rhinitis: Secondary | ICD-10-CM | POA: Diagnosis not present

## 2024-03-02 DIAGNOSIS — J3489 Other specified disorders of nose and nasal sinuses: Secondary | ICD-10-CM | POA: Diagnosis not present

## 2024-03-02 DIAGNOSIS — J0100 Acute maxillary sinusitis, unspecified: Secondary | ICD-10-CM | POA: Diagnosis not present

## 2024-03-02 DIAGNOSIS — M8589 Other specified disorders of bone density and structure, multiple sites: Secondary | ICD-10-CM

## 2024-03-02 MED ORDER — AZELASTINE HCL 0.1 % NA SOLN: 1.0000 | Freq: Two times a day (BID) | NASAL | 0 refills | Status: DC

## 2024-03-02 MED ORDER — DENOSUMAB-BBDZ 60 MG/ML ~~LOC~~ SOSY
60.0000 mg | PREFILLED_SYRINGE | Freq: Once | SUBCUTANEOUS | Status: AC
  Administered 2024-04-12: 14:00:00 60 mg via SUBCUTANEOUS

## 2024-03-02 MED ORDER — JUBBONTI 60 MG/ML ~~LOC~~ SOSY
60.0000 mg | PREFILLED_SYRINGE | SUBCUTANEOUS | 0 refills | Status: DC
  Filled 2024-03-04: qty 1, 180d supply, fill #0

## 2024-03-02 MED ORDER — CETIRIZINE HCL 5 MG PO TABS: 5.0000 mg | ORAL_TABLET | Freq: Every day | ORAL | 2 refills | Status: DC

## 2024-03-02 MED ORDER — MUPIROCIN 2 % EX OINT: 1.0000 | TOPICAL_OINTMENT | Freq: Two times a day (BID) | CUTANEOUS | 0 refills | Status: DC

## 2024-03-02 MED ORDER — AMOXICILLIN-POT CLAVULANATE 875-125 MG PO TABS: 1.0000 | ORAL_TABLET | Freq: Two times a day (BID) | ORAL | 0 refills | Status: DC

## 2024-03-02 NOTE — Discharge Instructions (Signed)
 Start Zyrtec and Astelin daily for seasonal allergy control.  I have sent in an antibiotic for a sinus infection and some ointment to place inside your nostrils as well as any bumps that appear on the face.  You may continue your saline sinus rinses, decongestant such as Coricidin HBP, plain Mucinex, and any other over-the-counter remedies that are helpful

## 2024-03-02 NOTE — ED Triage Notes (Signed)
 Sinus pressure, headache, teeth hurting and nasal congestion x 2 weeks.  Has been doing nasal rinses and using medication comparable to sudafed.  States has scabs in nose that are painful.

## 2024-03-02 NOTE — Telephone Encounter (Signed)
 Prescription and referral placed to [facility].    Encounter closed.

## 2024-03-02 NOTE — ED Provider Notes (Signed)
 [facility]    CSN: [id number] Arrival date & time: 03/02/24  1454      History   Chief Complaint No chief complaint on file.   HPI Kayla [name] is a 83 y.o. female.   Patient presenting today with 2-week history of progressively worsening nasal congestion, facial pain and pressure, sinus headache, tooth and gum pain, fatigue.  Also having some thick scabs and pain in the inside of her nose intermittently worse recently.  So far trying sinus rinses, Sudafed with minimal relief.  Not currently on allergy regimen.  Has fever, chills, cough, chest pain, shortness of breath, vomiting, diarrhea.    Past Medical History:  Diagnosis Date   Hyperlipidemia    Hypertension    Ischemic heart disease    Myocardial infarction (HCC) 2003   Osteopenia 01/2019   T score -1.1 stable from prior DEXA    Patient Active Problem List   Diagnosis Date Noted   PAC (premature atrial contraction) 10/08/2022   Encounter for preoperative examination for general surgical procedure 10/08/2022   Mitral regurgitation 10/08/2022   S/P laparoscopic cholecystectomyAugust2020 11/29/2018   Benign hypertensive heart disease without heart failure 03/01/2013   Vaginal atrophy 05/03/2012   Myocardial infarction [facility]    Hyperlipidemia    Ischemic heart disease    Senile osteoporosis 02/27/2011   Breast cyst 02/27/2011   Hyperkalemia 01/28/2011   Old MI (myocardial infarction) 08/08/2010   Hypercholesterolemia 08/08/2010   Osteoporosis 08/08/2010    Past Surgical History:  Procedure Laterality Date   ABDOMINAL HYSTERECTOMY  1984   TAH bleeding adenomyosis   BREAST SURGERY     Breast bx   CATARACT EXTRACTION W/ INTRAOCULAR LENS IMPLANT Bilateral 11/2017   CHOLECYSTECTOMY N/A 11/29/2018   Procedure: LAPAROSCOPIC CHOLECYSTECTOMY WITH INTRAOPERATIVE CHOLANGIOGRAM;  Surgeon: Gladis Cough, MD;  Location: [facility];  Service: General;  Laterality: N/A;    OB History     Gravida  2   Para   2   Term  2   Preterm      AB      Living  2      SAB      IAB      Ectopic      Multiple      Live Births               Home Medications    Prior to Admission medications   Medication Sig Start Date End Date Taking? Authorizing Provider  amoxicillin-clavulanate (AUGMENTIN) 875-125 MG tablet Take 1 tablet by mouth every 12 (twelve) hours. 03/02/24  Yes Stuart Vernell Norris, PA-C  azelastine (ASTELIN) 0.1 % nasal spray Place 1 spray into both nostrils 2 (two) times daily. Use in each nostril as directed 03/02/24  Yes Stuart Vernell Norris, PA-C  cetirizine (ZYRTEC) 5 MG tablet Take 1 tablet (5 mg total) by mouth daily. 03/02/24  Yes Stuart Vernell Norris, PA-C  mupirocin ointment (BACTROBAN) 2 % Apply 1 Application topically 2 (two) times daily. 03/02/24  Yes Stuart Vernell Norris, PA-C  alendronate  (FOSAMAX ) 70 MG tablet Take 1 tablet (70 mg total) by mouth every 7 (seven) days. Take with a full glass of water on an empty stomach. 11/11/23   Amundson C Silva, Brook E, MD  Ascorbic Acid (VITA-C PO) Take 1,000 mg by mouth in the morning and at bedtime.    [provider]  BELSOMRA 20 MG TABS Take 1 tablet by mouth at bedtime as needed. Per patient taking 1/4  mg of 20mg  tablet. 04/18/22   [provider]  Calcium  Citrate-Vitamin D (CALCIUM  + D PO) Take by mouth.    [provider]  Cholecalciferol (VITAMIN D) 1000 UNITS capsule Take 1,000 Units by mouth daily.    [provider]  clopidogrel  (PLAVIX ) 75 MG tablet Take 1 tablet (75 mg total) by mouth daily. 10/05/23   Wonda Sharper, MD  Coenzyme Q10 (COQ10) 100 MG CAPS Take 100 mg by mouth daily.    [provider]  denosumab -bbdz (JUBBONTI) 60 MG/ML SOSY injection Inject 60 mg into the skin every 6 (six) months. 03/02/24   Amundson C Silva, Brook E, MD  Estradiol  POWD ESTRADIOL  (ELLAGE) ( ) 0.02% (0.2MG /ML) CREAM 0.02% (0.2MG /ML) CREAM Insert 1mL vaginally twice  weekly. Quantity: 24 doses/prefilled applicators of 1mL Refills: 0  [facility] 10/19/23   Cathlyn JAYSON Cary, Bobie BRAVO, MD  ezetimibe  (ZETIA ) 10 MG tablet Take 1 tablet (10 mg total) by mouth daily. 05/14/23   Wonda Sharper, MD  HYDROcodone -acetaminophen  (NORCO/VICODIN) 5-325 MG tablet Oral 01/11/13   [provider]  Methylcellulose, Laxative, (CITRUCEL PO) Take 1 packet by mouth at bedtime.    [provider]  metoprolol  succinate (TOPROL -XL) 50 MG 24 hr tablet TAKE ONE TABLET BY MOUTH ONCE DAILY WITH, OR IMMEDIATELY FOLLOWING A MEAL. 11/02/23   Wonda Sharper, MD  nitroGLYCERIN  (NITROSTAT ) 0.4 MG SL tablet Place 1 tablet (0.4 mg total) under the tongue every 5 (five) minutes as needed for chest pain. 09/16/21   Hobart Powell BRAVO, MD  Omega-3 Fatty Acids (FISH OIL) 1200 MG CAPS Take 1,200 mg by mouth daily.    [provider]  rosuvastatin  (CRESTOR ) 10 MG tablet Take 1 tablet (10 mg total) by mouth daily. 05/14/23   Wonda Sharper, MD  traMADol  (ULTRAM ) 50 MG tablet Take 50 mg by mouth 3 (three) times daily as needed. 11/10/22   [provider]  vitamin B-12 (CYANOCOBALAMIN) 500 MCG tablet Take 500 mcg by mouth once a week.    [provider]    Family History Family History  Problem Relation Age of Onset   Hypertension Mother    Heart disease Mother    Osteoporosis Mother    Heart disease Father    Colon cancer Father    Breast cancer Maternal Grandmother        Age unknown   Cancer Paternal Grandmother        Unknown type    Social History Social History   Tobacco Use   Smoking status: Never   Smokeless tobacco: Never  Vaping Use   Vaping status: Never Used  Substance Use Topics   Alcohol use: No    Alcohol/week: 0.0 standard drinks of alcohol   Drug use: No     Allergies   Codeine, Lipitor [atorvastatin calcium ], and Simvastatin   Review of Systems Review of Systems Per HPI  Physical Exam Triage Vital  Signs ED Triage Vitals  Encounter Vitals Group     BP 03/02/24 1508 (!) 155/77     Girls Systolic BP Percentile --      Girls Diastolic BP Percentile --      Boys Systolic BP Percentile --      Boys Diastolic BP Percentile --      Pulse Rate 03/02/24 1508 69     Resp 03/02/24 1508 18     Temp 03/02/24 1508 97.6 F (36.4 C)     Temp Source 03/02/24 1508 Oral     SpO2  03/02/24 1508 97 %     Weight --      Height --      Head Circumference --      Peak Flow --      Pain Score 03/02/24 1509 5     Pain Loc --      Pain Education --      Exclude from Growth Chart --    No data found.  Updated Vital Signs BP (!) 155/77 (BP Location: Right Arm)   Pulse 69   Temp 97.6 F (36.4 C) (Oral)   Resp 18   SpO2 97%   Visual Acuity Right Eye Distance:   Left Eye Distance:   Bilateral Distance:    Right Eye Near:   Left Eye Near:    Bilateral Near:     Physical Exam Vitals and nursing note reviewed.  Constitutional:      Appearance: Normal appearance.  HENT:     Head: Atraumatic.     Right Ear: Tympanic membrane and external ear normal.     Left Ear: Tympanic membrane and external ear normal.     Nose: Congestion present.     Mouth/Throat:     Mouth: Mucous membranes are moist.     Pharynx: Posterior oropharyngeal erythema present.  Eyes:     Extraocular Movements: Extraocular movements intact.     Conjunctiva/sclera: Conjunctivae normal.  Cardiovascular:     Rate and Rhythm: Normal rate and regular rhythm.     Heart sounds: Normal heart sounds.  Pulmonary:     Effort: Pulmonary effort is normal.     Breath sounds: Normal breath sounds. No wheezing or rales.  Musculoskeletal:        General: Normal range of motion.     Cervical back: Normal range of motion and neck supple.  Skin:    General: Skin is warm and dry.  Neurological:     Mental Status: She is alert and oriented to person, place, and time.  Psychiatric:        Mood and Affect: Mood normal.        Thought  Content: Thought content normal.      UC Treatments / Results  Labs (all labs ordered are listed, but only abnormal results are displayed) Labs Reviewed - No data to display  EKG   Radiology No results found.  Procedures Procedures (including critical care time)  Medications Ordered in UC Medications - No data to display  Initial Impression / Assessment and Plan / UC Course  I have reviewed the triage vital signs and the nursing notes.  Pertinent labs & imaging results that were available during my care of the patient were reviewed by me and considered in my medical decision making (see chart for details).     Hypertensive in triage, otherwise vital signs reassuring.  Will treat for sinusitis with Augmentin, mupirocin for the nasal sores and Astelin, Zyrtec for seasonal allergies.  Discussed supportive over-the-counter medications, home care and return precautions.  Final Clinical Impressions(s) / UC Diagnoses   Final diagnoses:  Acute maxillary sinusitis, recurrence not specified  Nasal sore  Seasonal allergic rhinitis due to other allergic trigger     Discharge Instructions      Start Zyrtec and Astelin daily for seasonal allergy control.  I have sent in an antibiotic for a sinus infection and some ointment to place inside your nostrils as well as any bumps that appear on the face.  You may continue your saline sinus  rinses, decongestant such as Coricidin HBP, plain Mucinex, and any other over-the-counter remedies that are helpful    ED Prescriptions     Medication Sig Dispense Auth. Provider   mupirocin ointment (BACTROBAN) 2 % Apply 1 Application topically 2 (two) times daily. 60 g Stuart Vernell Norris, PA-C   azelastine (ASTELIN) 0.1 % nasal spray Place 1 spray into both nostrils 2 (two) times daily. Use in each nostril as directed 30 mL Stuart Vernell Norris, PA-C   cetirizine (ZYRTEC) 5 MG tablet Take 1 tablet (5 mg total) by mouth daily. 30 tablet Stuart Vernell Norris, PA-C   amoxicillin-clavulanate (AUGMENTIN) 875-125 MG tablet Take 1 tablet by mouth every 12 (twelve) hours. 14 tablet Stuart Vernell Norris, [place]      PDMP not reviewed this encounter.   Stuart Vernell Norris, [place] 03/02/24 1656

## 2024-03-04 ENCOUNTER — Telehealth: Payer: Self-pay

## 2024-03-04 ENCOUNTER — Other Ambulatory Visit (HOSPITAL_COMMUNITY): Payer: Self-pay

## 2024-03-04 NOTE — Telephone Encounter (Signed)
 PHARMACY BENEFIT: $594.04

## 2024-03-04 NOTE — Telephone Encounter (Signed)
 Medication will be filled under Medical Benefit. MTDM appointment request cancelled.

## 2024-03-04 NOTE — Telephone Encounter (Signed)
 Buy/Bill (Office supplied medication)  Out-of-pocket cost due at time of clinic visit: $332  Number of injection/visits approved: ---  Primary: [employer] Co-insurance: 20% Admin fee co-insurance: 0%  Secondary: --- Co-insurance:  Admin fee co-insurance:   Medical Benefit Details: Date Benefits were checked: 03/04/24 Deductible: NO/ Coinsurance: 20%/ Admin Fee: 0%  Prior Auth: N/A PA# Expiration Date:   # of doses approved: -----------------------------------------------------------------------  Patient NOT eligible for Copay Card. Copay Card can make patient's cost as little as $25. Link to apply: [email or web address]://[email or web address]  ** This summary of benefits is an estimation of the patient's out-of-pocket cost. Exact cost may very based on individual plan coverage.

## 2024-03-05 ENCOUNTER — Other Ambulatory Visit (HOSPITAL_COMMUNITY): Payer: Self-pay

## 2024-03-07 ENCOUNTER — Other Ambulatory Visit: Payer: Self-pay

## 2024-03-15 ENCOUNTER — Telehealth: Payer: Self-pay

## 2024-03-15 NOTE — Telephone Encounter (Signed)
 Patient called triage line. She said she was returning a call to [facility] regarding prolia . She is aware Damien is not in the office today. Message routed to [facility]

## 2024-03-16 NOTE — Telephone Encounter (Signed)
 See referral.   Encounter closed.

## 2024-03-17 ENCOUNTER — Ambulatory Visit

## 2024-03-17 ENCOUNTER — Other Ambulatory Visit: Payer: Self-pay

## 2024-04-12 ENCOUNTER — Ambulatory Visit

## 2024-04-12 DIAGNOSIS — M8589 Other specified disorders of bone density and structure, multiple sites: Secondary | ICD-10-CM | POA: Diagnosis not present

## 2024-04-12 NOTE — Progress Notes (Signed)
 Jubbonti  given SQ left arm.  Patient tolerated injection well.  Annual exam: 10/19/23 BS  Calcium :      9.7      Date: 11/11/23  Upcoming dental procedures: No   Hx of Kidney Disease: No   Last Bone Density Scan: 04/30/23

## 2024-05-05 LAB — HM MAMMOGRAPHY

## 2024-05-09 ENCOUNTER — Encounter: Payer: Self-pay | Admitting: Obstetrics and Gynecology

## 2024-05-09 ENCOUNTER — Ambulatory Visit: Payer: Self-pay | Admitting: Obstetrics and Gynecology

## 2024-05-10 ENCOUNTER — Telehealth: Payer: Self-pay | Admitting: Cardiovascular Disease

## 2024-05-10 DIAGNOSIS — E785 Hyperlipidemia, unspecified: Secondary | ICD-10-CM

## 2024-05-10 NOTE — Telephone Encounter (Signed)
 Patient was calling to see if lab work needs to be done before her appt. Please advise

## 2024-05-10 NOTE — Telephone Encounter (Signed)
 CMET, lipid panel. thanks

## 2024-05-11 NOTE — Addendum Note (Signed)
 Addended by: GORDON RONAL SQUIBB on: 05/11/2024 02:59 PM   Modules accepted: Orders

## 2024-05-13 LAB — COMPREHENSIVE METABOLIC PANEL WITH GFR
ALT: 12 IU/L (ref 0–32)
AST: 25 IU/L (ref 0–40)
Albumin: 4.1 g/dL (ref 3.7–4.7)
Alkaline Phosphatase: 32 IU/L — ABNORMAL LOW (ref 48–129)
BUN/Creatinine Ratio: 23 (ref 12–28)
BUN: 21 mg/dL (ref 8–27)
Bilirubin Total: 0.7 mg/dL (ref 0.0–1.2)
CO2: 24 mmol/L (ref 20–29)
Calcium: 9.5 mg/dL (ref 8.7–10.3)
Chloride: 103 mmol/L (ref 96–106)
Creatinine, Ser: 0.93 mg/dL (ref 0.57–1.00)
Globulin, Total: 1.8 g/dL (ref 1.5–4.5)
Glucose: 110 mg/dL — ABNORMAL HIGH (ref 70–99)
Potassium: 4.4 mmol/L (ref 3.5–5.2)
Sodium: 141 mmol/L (ref 134–144)
Total Protein: 5.9 g/dL — ABNORMAL LOW (ref 6.0–8.5)
eGFR: 60 mL/min/1.73

## 2024-05-13 LAB — LIPID PANEL
Chol/HDL Ratio: 2.2 ratio (ref 0.0–4.4)
Cholesterol, Total: 155 mg/dL (ref 100–199)
HDL: 71 mg/dL
LDL Chol Calc (NIH): 70 mg/dL (ref 0–99)
Triglycerides: 72 mg/dL (ref 0–149)
VLDL Cholesterol Cal: 14 mg/dL (ref 5–40)

## 2024-05-16 ENCOUNTER — Ambulatory Visit: Admitting: Cardiovascular Disease

## 2024-05-16 ENCOUNTER — Encounter: Payer: Self-pay | Admitting: Cardiovascular Disease

## 2024-05-16 VITALS — BP 137/76 | HR 61 | Ht 68.0 in | Wt 168.0 lb

## 2024-05-16 DIAGNOSIS — I2510 Atherosclerotic heart disease of native coronary artery without angina pectoris: Secondary | ICD-10-CM | POA: Diagnosis not present

## 2024-05-16 DIAGNOSIS — I10 Essential (primary) hypertension: Secondary | ICD-10-CM | POA: Diagnosis not present

## 2024-05-16 DIAGNOSIS — R7309 Other abnormal glucose: Secondary | ICD-10-CM

## 2024-05-16 DIAGNOSIS — E7800 Pure hypercholesterolemia, unspecified: Secondary | ICD-10-CM | POA: Diagnosis not present

## 2024-05-16 NOTE — Progress Notes (Signed)
 " Cardiology Office Note:    Date:  05/16/2024   ID:  Kayla [name], DOB 1941/02/04, MRN [id number]  PCP:  Valma Carwin, MD   [facility] Providers Cardiologist:  Ozell Fell, MD     Referring MD: Valma Carwin, MD   Chief Complaint  Patient presents with   Coronary Artery Disease    History of Present Illness:    Kayla [name] is a 84 y.o. female with a hx of:  CAD with MI in 2003 Coronary report from 01/13/2002 demonstrated a large first diagonal branch with a proximal 95% stenosis. 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 No PCI performed.  Hypertension Hyperlipidemia - rosuvastatin  associated with joint pains, dose decreased 20--->10 mg 2025.  Arthritis  The patient is here alone today.  She has been doing well with no recent symptoms of chest pain, chest pressure, or shortness of breath.  Last year, when Zetia  was added to her medication regimen, she began to have joint aches again and she discontinued this after less than 1 month of therapy.  She is tolerating rosuvastatin  and had a recent LDL cholesterol of 70 mg/dL.  She is staying as active as possible and has no specific complaints today.  Current Medications: Active Medications[1]   Allergies:   Codeine, Lipitor [atorvastatin calcium ], and Simvastatin   ROS:   Please see the history of present illness.    All other systems reviewed and are negative.  EKGs/Labs/Other Studies Reviewed:    The following studies were reviewed today: Cardiac Studies & Procedures   ______________________________________________________________________________________________   STRESS TESTS  NM MYOCAR MULTI W/SPECT W 04/27/2017  Narrative  Blood pressure demonstrated a normal response to exercise.  There was no ST segment deviation noted during stress.  7 beat run of atrial tachycardia during recovery asymptomatic.  The study is normal. There are no perfusion  defects  This is a low risk study.  The left ventricular ejection fraction is hyperdynamic (>65%).  Elevated TID of 1.47 in setting of otherwise completely normal study, nonspecific finding.   ECHOCARDIOGRAM  ECHOCARDIOGRAM COMPLETE 11/05/2022  Narrative ECHOCARDIOGRAM REPORT    Patient Name:   Kayla [name] Date of Exam: 11/05/2022 Medical Rec #:  [id number]      Height:       68.0 in Accession #:    [id number]     Weight:       161.8 lb Date of Birth:  18-Jan-1941      BSA:          1.868 m Patient Age:    81 years       BP:           122/80 mmHg Patient Gender: F              HR:           63 bpm. Exam Location:  [address]  Procedure: 2D Echo, Cardiac Doppler and Color Doppler  Indications:    I34.0 Mitral Regurgitation  History:        Patient has prior history of Echocardiogram examinations, most recent 08/07/2020. Previous Myocardial Infarction; Risk Factors:Hypertension and Dyslipidemia.  Sonographer:    Carl Coma RDCS Referring Phys: [id number] ARTIST POUCH  IMPRESSIONS   1. Left ventricular ejection fraction, by estimation, is 60 to 65%. The left ventricle has normal function. The left ventricle has no regional wall motion abnormalities. There is mild left ventricular hypertrophy. Left  ventricular diastolic parameters are consistent with Grade I diastolic dysfunction (impaired relaxation). 2. Right ventricular systolic function is normal. The right ventricular size is normal. There is normal pulmonary artery systolic pressure. 3. Left atrial size was mildly dilated. 4. The mitral valve is grossly normal. Mild mitral valve regurgitation. 5. The aortic valve is tricuspid. Aortic valve regurgitation is not visualized. 6. The inferior vena cava is normal in size with greater than 50% respiratory variability, suggesting right atrial pressure of 3 mmHg.  FINDINGS Left Ventricle: Left ventricular ejection fraction, by estimation, is 60 to 65%. The left  ventricle has normal function. The left ventricle has no regional wall motion abnormalities. The left ventricular internal cavity size was normal in size. There is mild left ventricular hypertrophy. Left ventricular diastolic parameters are consistent with Grade I diastolic dysfunction (impaired relaxation).  Right Ventricle: The right ventricular size is normal. Right ventricular systolic function is normal. There is normal pulmonary artery systolic pressure. The tricuspid regurgitant velocity is 2.61 m/s, and with an assumed right atrial pressure of 3 mmHg, the estimated right ventricular systolic pressure is 30.2 mmHg.  Left Atrium: Left atrial size was mildly dilated.  Right Atrium: Right atrial size was normal in size.  Pericardium: There is no evidence of pericardial effusion.  Mitral Valve: The mitral valve is grossly normal. Mild mitral valve regurgitation.  Tricuspid Valve: The tricuspid valve is normal in structure. Tricuspid valve regurgitation is mild.  Aortic Valve: The aortic valve is tricuspid. Aortic valve regurgitation is not visualized.  Pulmonic Valve: The pulmonic valve was normal in structure. Pulmonic valve regurgitation is trivial.  Aorta: The aortic root and ascending aorta are structurally normal, with no evidence of dilitation.  Venous: The inferior vena cava is normal in size with greater than 50% respiratory variability, suggesting right atrial pressure of 3 mmHg.  IAS/Shunts: No atrial level shunt detected by color flow Doppler.   LEFT VENTRICLE PLAX 2D LVIDd:         4.00 cm   Diastology LVIDs:         2.50 cm   LV e' medial:    6.96 cm/s LV PW:         1.10 cm   LV E/e' medial:  12.8 LV IVS:        1.10 cm   LV e' lateral:   8.22 cm/s LVOT diam:     1.80 cm   LV E/e' lateral: 10.9 LV SV:         62 LV SV Index:   33 LVOT Area:     2.54 cm   RIGHT VENTRICLE             IVC RV Basal diam:  4.00 cm     IVC diam: 1.40 cm RV S prime:     15.25  cm/s TAPSE (M-mode): 2.8 cm  LEFT ATRIUM             Index        RIGHT ATRIUM           Index LA diam:        4.40 cm 2.36 cm/m   RA Area:     14.10 cm LA Vol (A2C):   57.2 ml 30.63 ml/m  RA Volume:   37.00 ml  19.81 ml/m LA Vol (A4C):   60.4 ml 32.34 ml/m LA Biplane Vol: 62.8 ml 33.62 ml/m AORTIC VALVE LVOT Vmax:   106.00 cm/s LVOT Vmean:  69.050 cm/s LVOT VTI:  0.244 m  AORTA Ao Root diam: 2.90 cm Ao Asc diam:  3.50 cm  MITRAL VALVE                TRICUSPID VALVE MV Area (PHT): 3.59 cm     TR Peak grad:   27.2 mmHg MV Decel Time: 212 msec     TR Vmax:        261.00 cm/s MV E velocity: 89.15 cm/s MV A velocity: 104.40 cm/s  SHUNTS MV E/A ratio:  0.85         Systemic VTI:  0.24 m Systemic Diam: 1.80 cm  Ronal Ross Electronically signed by Ronal Ross Signature Date/Time: 11/05/2022/5:03:03 PM    Final    MONITORS  LONG TERM MONITOR (3-14 DAYS) 10/15/2022  Narrative   Patch wear time was 3 days   Predominant rhythm was NSR with average HR 67bpm   There were 73 runs of nonsustained SVT with longest lasting 15 beats   Frequen SVE (10.5%), rare VE (<1%)   No sustained arrhythmias or significant pauses   No patient triggered events   Patch Wear Time:  3 days and 0 hours (2024-06-12T15:58:46-0400 to 2024-06-15T16:15:55-0400)  Patient had a min HR of 49 bpm, max HR of 167 bpm, and avg HR of 67 bpm. Predominant underlying rhythm was Sinus Rhythm. 73 Supraventricular Tachycardia runs occurred, the run with the fastest interval lasting 7 beats with a max rate of 167 bpm, the longest lasting 15 beats with an avg rate of 112 bpm. Isolated SVEs were frequent (10.5%, [id number]), SVE Couplets were occasional (3.2%, [id number]), and no SVE Triplets were present. Isolated VEs were rare (<1.0%), and no VE Couplets or VE Triplets were present.  Powell Sorrow, MD       ______________________________________________________________________________________________      EKG:    EKG Interpretation Date/Time:  Monday May 16 2024 11:22:54 EST Ventricular Rate:  61 PR Interval:  180 QRS Duration:  88 QT Interval:  414 QTC Calculation: 416 R Axis:   38  Text Interpretation: Normal sinus rhythm Normal ECG When compared with ECG of 02-Aug-2005 03:44, No significant change was found Confirmed by Wonda Sharper [phone]) on 05/16/2024 11:36:19 AM    Recent Labs: 05/12/2024: ALT 12; BUN 21; Creatinine, Ser 0.93; Potassium 4.4; Sodium 141  Recent Lipid Panel    Component Value Date/Time   CHOL 155 05/12/2024 0947   TRIG 72 05/12/2024 0947   HDL 71 05/12/2024 0947   CHOLHDL 2.2 05/12/2024 0947   CHOLHDL 2.2 03/12/2015 0840   VLDL 10 03/12/2015 0840   LDLCALC 70 05/12/2024 0947     Risk Assessment/Calculations:               Physical Exam:    VS:  BP 137/76 (BP Location: Left Arm)   Pulse 61   Ht 5' 8 (1.727 m)   Wt 168 lb (76.2 kg)   SpO2 98%   BMI 25.54 kg/m     Wt Readings from Last 3 Encounters:  05/16/24 168 lb (76.2 kg)  01/25/24 164 lb (74.4 kg)  10/19/23 165 lb (74.8 kg)     GEN:  Well nourished, well developed in no acute distress HEENT: Normal NECK: No JVD; No carotid bruits LYMPHATICS: No lymphadenopathy CARDIAC: RRR, no murmurs, rubs, gallops RESPIRATORY:  Clear to auscultation without rales, wheezing or rhonchi  ABDOMEN: Soft, non-tender, non-distended MUSCULOSKELETAL:  No edema; No deformity  SKIN: Warm and dry NEUROLOGIC:  Alert and oriented x 3 PSYCHIATRIC:  Normal  affect   Assessment & Plan Coronary artery disease involving native coronary artery of native heart without angina pectoris Stable without symptoms of angina, greater than 20 years out from her MI.  Continue clopidogrel , metoprolol  succinate, and rosuvastatin . Essential hypertension Patient is anxious today and has a history of whitecoat hypertension.  Continue current management with metoprolol  succinate Hypercholesterolemia Rosuvastatin  decreased last  year due to joint pains. Zetia  added. Joint pain again worse with zetia  - discontinued now. Remains on rosuvastatin  10 mg daily. Recent labs with chol 155, Trig 72, HDL 71, LDL 70.  Continue current therapy. Elevated glucose Patient with a recent elevated glucose checked when she was fasting, 110 mg/dL.  We talked about her diet and she is eating a lot of sugars.  She will return in 3 months after modifying her diet for hemoglobin A1c check.  I will see her back in 1 year.     Medication Adjustments/Labs and Tests Ordered: Current medicines are reviewed at length with the patient today.  Concerns regarding medicines are outlined above.  Orders Placed This Encounter  Procedures   Hemoglobin A1c   EKG 12-Lead   No orders of the defined types were placed in this encounter.   Patient Instructions  Medication Instructions:  No medication changes were made at this visit. Continue current regimen.   *If you need a refill on your cardiac medications before your next appointment, please call your pharmacy*  Lab Work: To be completed in 3 months: hgbA1C  If you have labs (blood work) drawn today and your tests are completely normal, you will receive your results only by: MyChart Message (if you have MyChart) OR A paper copy in the mail If you have any lab test that is abnormal or we need to change your treatment, we will call you to review the results.  Testing/Procedures: None ordered today.  Follow-Up: At [facility], you and your health needs are our priority.  As part of our continuing mission to provide you with exceptional heart care, our providers are all part of one team.  This team includes your primary Cardiologist (physician) and Advanced Practice Providers or APPs (Physician Assistants and Nurse Practitioners) who all work together to provide you with the care you need, when you need it.  Your next appointment:   1 year(s)  Provider:   Ozell Fell, MD      Signed, Ozell Fell, MD  05/16/2024 2:02 PM    [facility]     [1]  Current Meds  Medication Sig   Ascorbic Acid (VITA-C PO) Take 1,000 mg by mouth in the morning and at bedtime.   BELSOMRA 20 MG TABS Take 1 tablet by mouth at bedtime as needed. Per patient taking 1/4 mg of 20mg  tablet.   Calcium  Citrate-Vitamin D (CALCIUM  + D PO) Take by mouth.   Cholecalciferol (VITAMIN D) 1000 UNITS capsule Take 1,000 Units by mouth daily.   clopidogrel  (PLAVIX ) 75 MG tablet Take 1 tablet (75 mg total) by mouth daily.   Coenzyme Q10 (COQ10) 100 MG CAPS Take 100 mg by mouth daily.   Estradiol  POWD ESTRADIOL  (ELLAGE) ( ) 0.02% (0.2MG /ML) CREAM 0.02% (0.2MG /ML) CREAM Insert 1mL vaginally twice weekly. Quantity: 24 doses/prefilled applicators of 1mL Refills: 0  [facility] (Patient taking differently: once a week. ESTRADIOL  (ELLAGE) ( ) 0.02% (0.2MG /ML) CREAM 0.02% (0.2MG /ML) CREAM Insert 1mL vaginally twice weekly. Quantity: 24 doses/prefilled applicators of 1mL Refills: 0  [facility])   Methylcellulose, Laxative, (CITRUCEL PO)  Take 1 packet by mouth at bedtime.   metoprolol  succinate (TOPROL -XL) 50 MG 24 hr tablet TAKE ONE TABLET BY MOUTH ONCE DAILY WITH, OR IMMEDIATELY FOLLOWING A MEAL.   nitroGLYCERIN  (NITROSTAT ) 0.4 MG SL tablet Place 1 tablet (0.4 mg total) under the tongue every 5 (five) minutes as needed for chest pain.   Omega-3 Fatty Acids (FISH OIL) 1200 MG CAPS Take 1,200 mg by mouth daily.   rosuvastatin  (CRESTOR ) 10 MG tablet Take 1 tablet (10 mg total) by mouth daily.   traMADol  (ULTRAM ) 50 MG tablet Take 50 mg by mouth 3 (three) times daily as needed.   vitamin B-12 (CYANOCOBALAMIN) 500 MCG tablet Take 500 mcg by mouth once a week.   "

## 2024-05-16 NOTE — Assessment & Plan Note (Addendum)
 Rosuvastatin  decreased last year due to joint pains. Zetia  added. Joint pain again worse with zetia  - discontinued now. Remains on rosuvastatin  10 mg daily. Recent labs with chol 155, Trig 72, HDL 71, LDL 70.  Continue current therapy.

## 2024-05-16 NOTE — Patient Instructions (Signed)
 Medication Instructions:  No medication changes were made at this visit. Continue current regimen.   *If you need a refill on your cardiac medications before your next appointment, please call your pharmacy*  Lab Work: To be completed in 3 months: hgbA1C  If you have labs (blood work) drawn today and your tests are completely normal, you will receive your results only by: MyChart Message (if you have MyChart) OR A paper copy in the mail If you have any lab test that is abnormal or we need to change your treatment, we will call you to review the results.  Testing/Procedures: None ordered today.  Follow-Up: At [facility], you and your health needs are our priority.  As part of our continuing mission to provide you with exceptional heart care, our providers are all part of one team.  This team includes your primary Cardiologist (physician) and Advanced Practice Providers or APPs (Physician Assistants and Nurse Practitioners) who all work together to provide you with the care you need, when you need it.  Your next appointment:   1 year(s)  Provider:   Ozell Fell, MD

## 2024-05-17 ENCOUNTER — Ambulatory Visit: Payer: Self-pay | Admitting: Cardiovascular Disease

## 2024-10-24 ENCOUNTER — Ambulatory Visit: Admitting: Obstetrics and Gynecology
